# Patient Record
Sex: Male | Born: 1935 | Race: Black or African American | Hispanic: No | Smoking: Former smoker
Health system: Southern US, Community
[De-identification: ages and names within clinical notes are randomized; demographics above are authoritative.]

## PROBLEM LIST (undated history)

## (undated) DIAGNOSIS — E785 Hyperlipidemia, unspecified: Secondary | ICD-10-CM

## (undated) DIAGNOSIS — J449 Chronic obstructive pulmonary disease, unspecified: Secondary | ICD-10-CM

## (undated) DIAGNOSIS — I10 Essential (primary) hypertension: Secondary | ICD-10-CM

## (undated) DIAGNOSIS — R931 Abnormal findings on diagnostic imaging of heart and coronary circulation: Secondary | ICD-10-CM

## (undated) DIAGNOSIS — R011 Cardiac murmur, unspecified: Secondary | ICD-10-CM

## (undated) DIAGNOSIS — M6740 Ganglion, unspecified site: Secondary | ICD-10-CM

## (undated) DIAGNOSIS — K635 Polyp of colon: Secondary | ICD-10-CM

## (undated) HISTORY — DX: Polyp of colon: K63.5

## (undated) HISTORY — DX: Essential (primary) hypertension: I10

## (undated) HISTORY — DX: Ganglion, unspecified site: M67.40

## (undated) HISTORY — PX: TRANSTHORACIC ECHOCARDIOGRAM: SHX275

## (undated) HISTORY — DX: Chronic obstructive pulmonary disease, unspecified: J44.9

## (undated) HISTORY — DX: Abnormal findings on diagnostic imaging of heart and coronary circulation: R93.1

## (undated) HISTORY — DX: Hyperlipidemia, unspecified: E78.5

## (undated) HISTORY — PX: COLON SURGERY: SHX602

## (undated) HISTORY — DX: Cardiac murmur, unspecified: R01.1

## (undated) SURGERY — Surgical Case
Anesthesia: *Unknown

---

## 1998-08-07 ENCOUNTER — Emergency Department (HOSPITAL_COMMUNITY): Admission: EM | Admit: 1998-08-07 | Discharge: 1998-08-07 | Payer: Self-pay | Admitting: Emergency Medicine

## 1998-08-07 ENCOUNTER — Encounter: Payer: Self-pay | Admitting: Emergency Medicine

## 2000-04-10 ENCOUNTER — Emergency Department (HOSPITAL_COMMUNITY): Admission: EM | Admit: 2000-04-10 | Discharge: 2000-04-10 | Payer: Self-pay | Admitting: *Deleted

## 2000-05-25 ENCOUNTER — Ambulatory Visit (HOSPITAL_COMMUNITY): Admission: RE | Admit: 2000-05-25 | Discharge: 2000-05-25 | Payer: Self-pay | Admitting: Gastroenterology

## 2002-12-13 ENCOUNTER — Ambulatory Visit (HOSPITAL_COMMUNITY): Admission: RE | Admit: 2002-12-13 | Discharge: 2002-12-13 | Payer: Self-pay | Admitting: General Surgery

## 2003-03-25 ENCOUNTER — Emergency Department (HOSPITAL_COMMUNITY): Admission: EM | Admit: 2003-03-25 | Discharge: 2003-03-25 | Payer: Self-pay | Admitting: Family Medicine

## 2004-04-20 ENCOUNTER — Emergency Department (HOSPITAL_COMMUNITY): Admission: EM | Admit: 2004-04-20 | Discharge: 2004-04-20 | Payer: Self-pay | Admitting: Emergency Medicine

## 2004-05-14 ENCOUNTER — Encounter: Admission: RE | Admit: 2004-05-14 | Discharge: 2004-05-14 | Payer: Self-pay | Admitting: Family Medicine

## 2005-04-23 ENCOUNTER — Encounter: Admission: RE | Admit: 2005-04-23 | Discharge: 2005-04-23 | Payer: Self-pay | Admitting: Family Medicine

## 2005-10-29 ENCOUNTER — Encounter: Admission: RE | Admit: 2005-10-29 | Discharge: 2005-10-29 | Payer: Self-pay | Admitting: Family Medicine

## 2006-05-24 ENCOUNTER — Encounter: Admission: RE | Admit: 2006-05-24 | Discharge: 2006-05-24 | Payer: Self-pay | Admitting: Family Medicine

## 2007-02-17 ENCOUNTER — Encounter: Admission: RE | Admit: 2007-02-17 | Discharge: 2007-02-17 | Payer: Self-pay | Admitting: Family Medicine

## 2010-02-15 ENCOUNTER — Encounter: Payer: Self-pay | Admitting: Family Medicine

## 2010-06-30 ENCOUNTER — Other Ambulatory Visit: Payer: Self-pay | Admitting: Gastroenterology

## 2010-07-28 ENCOUNTER — Encounter: Payer: Self-pay | Admitting: Family Medicine

## 2010-07-28 DIAGNOSIS — J449 Chronic obstructive pulmonary disease, unspecified: Secondary | ICD-10-CM | POA: Insufficient documentation

## 2010-07-28 DIAGNOSIS — K635 Polyp of colon: Secondary | ICD-10-CM | POA: Insufficient documentation

## 2010-07-28 DIAGNOSIS — E785 Hyperlipidemia, unspecified: Secondary | ICD-10-CM | POA: Insufficient documentation

## 2010-07-28 DIAGNOSIS — R319 Hematuria, unspecified: Secondary | ICD-10-CM | POA: Insufficient documentation

## 2010-07-28 DIAGNOSIS — M6740 Ganglion, unspecified site: Secondary | ICD-10-CM | POA: Insufficient documentation

## 2010-07-28 DIAGNOSIS — I10 Essential (primary) hypertension: Secondary | ICD-10-CM | POA: Insufficient documentation

## 2010-07-28 DIAGNOSIS — R011 Cardiac murmur, unspecified: Secondary | ICD-10-CM | POA: Insufficient documentation

## 2010-07-28 DIAGNOSIS — R931 Abnormal findings on diagnostic imaging of heart and coronary circulation: Secondary | ICD-10-CM | POA: Insufficient documentation

## 2012-06-28 ENCOUNTER — Telehealth: Payer: Self-pay | Admitting: Physician Assistant

## 2012-06-28 MED ORDER — BENAZEPRIL-HYDROCHLOROTHIAZIDE 20-25 MG PO TABS: 1.0000 | ORAL_TABLET | Freq: Every day | ORAL | Status: DC

## 2012-06-28 NOTE — Telephone Encounter (Signed)
Medication refilled per protocol. 

## 2012-09-29 ENCOUNTER — Other Ambulatory Visit: Payer: Self-pay | Admitting: Physician Assistant

## 2012-09-29 ENCOUNTER — Encounter: Payer: Self-pay | Admitting: Family Medicine

## 2012-09-29 NOTE — Telephone Encounter (Signed)
Medication refill for one time only.  Patient needs to be seen.  Letter sent for patient to call and schedule 

## 2012-10-18 ENCOUNTER — Ambulatory Visit (INDEPENDENT_AMBULATORY_CARE_PROVIDER_SITE_OTHER): Payer: Medicare Other | Admitting: Physician Assistant

## 2012-10-18 ENCOUNTER — Encounter: Payer: Self-pay | Admitting: Physician Assistant

## 2012-10-18 VITALS — BP 142/76 | HR 64 | Temp 98.2°F | Resp 18 | Ht 65.75 in | Wt 185.0 lb

## 2012-10-18 DIAGNOSIS — Z23 Encounter for immunization: Secondary | ICD-10-CM

## 2012-10-18 DIAGNOSIS — R011 Cardiac murmur, unspecified: Secondary | ICD-10-CM

## 2012-10-18 DIAGNOSIS — D126 Benign neoplasm of colon, unspecified: Secondary | ICD-10-CM

## 2012-10-18 DIAGNOSIS — K635 Polyp of colon: Secondary | ICD-10-CM

## 2012-10-18 DIAGNOSIS — E785 Hyperlipidemia, unspecified: Secondary | ICD-10-CM

## 2012-10-18 DIAGNOSIS — R9389 Abnormal findings on diagnostic imaging of other specified body structures: Secondary | ICD-10-CM

## 2012-10-18 DIAGNOSIS — R931 Abnormal findings on diagnostic imaging of heart and coronary circulation: Secondary | ICD-10-CM

## 2012-10-18 DIAGNOSIS — I10 Essential (primary) hypertension: Secondary | ICD-10-CM

## 2012-10-18 LAB — COMPLETE METABOLIC PANEL WITH GFR
ALT: 24 U/L (ref 0–53)
AST: 19 U/L (ref 0–37)
Albumin: 4.2 g/dL (ref 3.5–5.2)
BUN: 16 mg/dL (ref 6–23)
CO2: 30 mEq/L (ref 19–32)
Calcium: 9.5 mg/dL (ref 8.4–10.5)
Chloride: 106 mEq/L (ref 96–112)
GFR, Est African American: 84 mL/min
Potassium: 4.3 mEq/L (ref 3.5–5.3)

## 2012-10-18 LAB — LIPID PANEL
Cholesterol: 172 mg/dL (ref 0–200)
LDL Cholesterol: 122 mg/dL — ABNORMAL HIGH (ref 0–99)
VLDL: 9 mg/dL (ref 0–40)

## 2012-10-18 NOTE — Progress Notes (Signed)
Patient ID: Patrick Villa MRN: [id number], DOB: 07/18/1933, 79 y.o. Date of Encounter: @DATE @  Chief Complaint:  Chief Complaint  Patient presents with  . routine check up    is fasting    HPI: 79 y.o. year old AA male  presents for routine followup office visit. His last office visit with me was actually back in May of 2013. However he does report that he is still taking all of the same prescription medications as directed at that time.  The only other doctor that he has seen since then his urology. He sees them routinely. He says he is actually considering being in a research study with him. Says that currently there is a pill form of the same type of medicine available that shoulder and knees for anuresis. Says that the study is for ACE nasal spray that same type of medicine that is being used to treat nocturia and adults. This he has a followup appointment with them tomorrow to to decide if he is eligible to be in the study.  Has not needed to see any other types of Dr. since his last visit with me.  He checks his blood pressure at home and sometimes gets 117 and 120s. Sometimes gives him more like 138-142.  Taking his blood pressure medicines as directed with no adverse effects. No lightheadedness. No extremity edema.  He still works. 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 He has no other exercise. With this exertion he has no chest pressure or heaviness no chest tightness or squeezing. No increased shortness of breath or dyspnea on exertion.    Past Medical History  Diagnosis Date  . Hypertension   . COPD (chronic obstructive pulmonary disease)   . Hyperlipidemia   . Colon polyps   . Systolic murmur   . Echocardiogram abnormal   . Hematuria   . Ganglion cyst      Home Meds: See attached medication section for current medication list. Any medications entered into computer today will not appear on this note's list. The medications listed below were entered prior to  today. Current Outpatient Prescriptions on File Prior to Visit  Medication Sig Dispense Refill  . aspirin 325 MG tablet Take 325 mg by mouth daily.        . benazepril-hydrochlorthiazide (LOTENSIN HCT) 20-25 MG per tablet TAKE ONE TABLET BY MOUTH DAILY  30 tablet  0  . diltiazem (CARDIZEM CD) 240 MG 24 hr capsule Take 240 mg by mouth daily.        Marland Kitchen terazosin (HYTRIN) 2 MG capsule Take 2 mg by mouth at bedtime.         No current facility-administered medications on file prior to visit.    Allergies:  Allergies  Allergen Reactions  . Amlodipine Besy-Benazepril Hcl     Jittery, gets confused.  . Lipitor [Atorvastatin Calcium]     Weakness.  . Zocor [Simvastatin]   . Bactrim [Sulfamethoxazole-Tmp Ds] Rash  . Pravachol Hives and Rash    History   Social History  . Marital Status: Widowed    Spouse Name: N/A    Number of Children: N/A  . Years of Education: N/A   Occupational History  . Not on file.   Social History Main Topics  . Smoking status: Former Smoker    Quit date: 10/18/1981  . Smokeless tobacco: Never Used  . Alcohol Use: No  . Drug Use: No  . Sexual Activity: Not on file   Other  Topics Concern  . 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   No other exercise   Plays guitar.   Separated. Lives alone.    Family History  Problem Relation Age of Onset  . Diabetes Mother   . Stroke Brother      Review of Systems:  See HPI for pertinent ROS. All other ROS negative.    Physical Exam: Blood pressure 142/76, pulse 64, temperature 98.2 F (36.8 C), temperature source Oral, resp. rate 18, height 5' 5.75" (1.67 m), weight 185 lb (83.915 kg)., Body mass index is 30.09 kg/(m^2). General: Very pleasant well-nourished well-developed African American male .Appears in no acute distress. Neck: Supple. No thyromegaly. No lymphadenopathy. No carotid bruits. Lungs: Clear bilaterally to auscultation without  wheezes, rales, or rhonchi. Breathing is unlabored. Heart: RRR with S1 S2. No murmurs, rubs, or gallops. I noted a 1/6 murmur in the past but really hear no murmur today. Abdomen: Soft, non-tender, non-distended with normoactive bowel sounds. No hepatomegaly. No rebound/guarding. No obvious abdominal masses. Musculoskeletal:  Strength and tone normal for age. Extremities/Skin: Warm and dry. No clubbing or cyanosis. No edema. No rashes or suspicious lesions. Neuro: Alert and oriented X 3. Moves all extremities spontaneously. Gait is normal. CNII-XII grossly in tact. Psych:  Responds to questions appropriately with a normal affect.     ASSESSMENT AND PLAN:  79 y.o. year old male with  1. Hypertension See his home blood pressure recordings as above. He says he actually feels quite weak whenever he does get lower readings towards the 117-120 range systolic. He is getting good readings at home. Continue current medication. - COMPLETE METABOLIC PANEL WITH GFR  2. Hyperlipidemia He has a history of intolerance to Zocor, Lipitor, Pravachol. In the past he tolerated Crestor but quit this secondary to cost. He is been off of medication since April 2009. We had rechecked his lipids since been off of medication and the numbers were good so we decided he was fine to stay off of medication.  He is fasting today so we will go ahead and recheck lipid profile off of medication again to monitor. - COMPLETE METABOLIC PANEL WITH GFR - Lipid panel  3. Colon polyps Last colonoscopy was June 2012. 2 repeat 3 years which will be June 2015.  4. Systolic murmur Last echo was June 2011. See below  5. Echocardiogram abnormal Echo June 2011 showed normal LV function. Mild aortic sclerosis. Trace MR. Trace TR.  #6 BPH/hematuria he sees urology routinely for these  #7 screening labs PSA is followed by urology routinely He had a CBC and a TSH done here of May 2012 are normal  #8 immunizations A Pneumovax  November 2012 B tetanus May 2012 C Zostavax: I discussed this again with him today. He said he did check with his insurance company but it was going to cost significant cost to him he is deferring. D influenza vaccine: He is agreeable to get this we will give this to him today.  Regular office visit 6 months or sooner if he needs [place]   Signed, [facility] [place], [place], [facility] 10/18/2012 8:37 AM

## 2012-10-18 NOTE — Addendum Note (Signed)
Addended by: Donne Anon on: 10/18/2012 09:08 AM   Modules accepted: Orders

## 2012-10-19 ENCOUNTER — Ambulatory Visit (INDEPENDENT_AMBULATORY_CARE_PROVIDER_SITE_OTHER): Payer: Self-pay | Admitting: *Deleted

## 2012-10-19 DIAGNOSIS — I10 Essential (primary) hypertension: Secondary | ICD-10-CM

## 2012-10-23 ENCOUNTER — Encounter: Payer: Self-pay | Admitting: Family Medicine

## 2012-11-10 ENCOUNTER — Other Ambulatory Visit: Payer: Self-pay | Admitting: Physician Assistant

## 2012-11-10 NOTE — Telephone Encounter (Signed)
Medication refilled per protocol. 

## 2013-02-01 ENCOUNTER — Encounter (INDEPENDENT_AMBULATORY_CARE_PROVIDER_SITE_OTHER): Payer: Self-pay

## 2013-02-01 ENCOUNTER — Encounter: Payer: Self-pay | Admitting: Cardiology

## 2013-02-01 ENCOUNTER — Ambulatory Visit: Payer: Self-pay | Admitting: *Deleted

## 2013-02-01 DIAGNOSIS — R011 Cardiac murmur, unspecified: Secondary | ICD-10-CM

## 2013-02-01 NOTE — Progress Notes (Signed)
Pt seen for EKG for [facility].   Pt pleasant and voices no complaints today. EKG complete, reviewed by Dr. Aundra Dubin (DOD) Copy of EKG given to patient, medical records and business manager Pt escorted to lobby in stable condition.

## 2013-05-22 ENCOUNTER — Other Ambulatory Visit: Payer: Self-pay | Admitting: Physician Assistant

## 2013-05-23 NOTE — Telephone Encounter (Signed)
Do not see Diltiazem 90 mg TID on medication list.  Have called and left message for patient to find out what he is taking.

## 2013-05-24 NOTE — Telephone Encounter (Signed)
Called patient and medication clarified.  Not taking 240 mg 24 hr dose due to cost.  Is taking TID dose.    1 month visit appt made.  One month refill sent

## 2013-05-31 ENCOUNTER — Ambulatory Visit (INDEPENDENT_AMBULATORY_CARE_PROVIDER_SITE_OTHER): Payer: Medicare Other | Admitting: Physician Assistant

## 2013-05-31 ENCOUNTER — Encounter: Payer: Self-pay | Admitting: Physician Assistant

## 2013-05-31 VITALS — BP 134/76 | HR 68 | Temp 97.8°F | Resp 18 | Ht 65.0 in | Wt 187.0 lb

## 2013-05-31 DIAGNOSIS — Z23 Encounter for immunization: Secondary | ICD-10-CM

## 2013-05-31 DIAGNOSIS — R931 Abnormal findings on diagnostic imaging of heart and coronary circulation: Secondary | ICD-10-CM

## 2013-05-31 DIAGNOSIS — R9389 Abnormal findings on diagnostic imaging of other specified body structures: Secondary | ICD-10-CM

## 2013-05-31 DIAGNOSIS — N400 Enlarged prostate without lower urinary tract symptoms: Secondary | ICD-10-CM

## 2013-05-31 DIAGNOSIS — J449 Chronic obstructive pulmonary disease, unspecified: Secondary | ICD-10-CM

## 2013-05-31 DIAGNOSIS — R011 Cardiac murmur, unspecified: Secondary | ICD-10-CM

## 2013-05-31 DIAGNOSIS — I10 Essential (primary) hypertension: Secondary | ICD-10-CM

## 2013-05-31 DIAGNOSIS — K635 Polyp of colon: Secondary | ICD-10-CM

## 2013-05-31 DIAGNOSIS — D126 Benign neoplasm of colon, unspecified: Secondary | ICD-10-CM

## 2013-05-31 DIAGNOSIS — E785 Hyperlipidemia, unspecified: Secondary | ICD-10-CM

## 2013-05-31 LAB — BASIC METABOLIC PANEL WITH GFR
BUN: 13 mg/dL (ref 6–23)
CALCIUM: 9.5 mg/dL (ref 8.4–10.5)
CO2: 28 mEq/L (ref 19–32)
CREATININE: 1 mg/dL (ref 0.50–1.35)
Chloride: 102 mEq/L (ref 96–112)
GFR, EST AFRICAN AMERICAN: 84 mL/min
GFR, Est Non African American: 72 mL/min
GLUCOSE: 101 mg/dL — AB (ref 70–99)
Potassium: 3.9 mEq/L (ref 3.5–5.3)
SODIUM: 139 meq/L (ref 135–145)

## 2013-05-31 MED ORDER — BENAZEPRIL-HYDROCHLOROTHIAZIDE 20-25 MG PO TABS: ORAL_TABLET | ORAL | Status: DC

## 2013-05-31 MED ORDER — TERAZOSIN HCL 2 MG PO CAPS: 2.0000 mg | ORAL_CAPSULE | Freq: Every day | ORAL | Status: DC

## 2013-05-31 MED ORDER — DILTIAZEM HCL 90 MG PO TABS
ORAL_TABLET | ORAL | Status: DC
Start: 2013-05-31 — End: 2014-06-05

## 2013-05-31 NOTE — Progress Notes (Signed)
Patient ID: Patrick Villa MRN: [id number], DOB: [birth year], 80 y.o. Date of Encounter: @DATE @  Chief Complaint:  Chief Complaint  Patient presents with  . 6 mth check up    is fasting    HPI: 80 y.o. year old AA male  presents for routine followup office visit. His last office visit with me was 09/2012.  The only other doctor that he has seen since then his urology. He sees them routinely. Has not needed to see any other types of medical providers.  He checks his blood pressure at home and sometimes gets 117 and 120s. Sometimes gives him more like 138-142.  Taking his blood pressure medicines as directed with no adverse effects. No lightheadedness. No extremity edema.  He still works. 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 He has no other exercise. With this exertion he has no chest pressure or heaviness no chest tightness or squeezing. No increased shortness of breath or dyspnea on exertion. 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    Past Medical History  Diagnosis Date  . Hypertension   . COPD (chronic obstructive pulmonary disease)   . Hyperlipidemia   . Colon polyps   . Systolic murmur   . Echocardiogram abnormal   . Hematuria   . Ganglion cyst      Home Meds: See attached medication section for current medication list. Any medications entered into computer today will not appear on this note's list. The medications listed below were entered prior to today. Current Outpatient Prescriptions on File Prior to Visit  Medication Sig Dispense Refill  . aspirin 325 MG tablet Take 325 mg by mouth daily.        . benazepril-hydrochlorthiazide (LOTENSIN HCT) 20-25 MG per tablet TAKE 1 TABLET BY MOUTH DAILY  30 tablet  6  . diltiazem (CARDIZEM) 90 MG tablet TAKE ONE TABLET BY MOUTH THREE TIMES DAILY  90 tablet  0  . terazosin (HYTRIN) 2 MG capsule Take 2 mg by mouth at bedtime.         No current  facility-administered medications on file prior to visit.    Allergies:  Allergies  Allergen Reactions  . Amlodipine Besy-Benazepril Hcl     Jittery, gets confused.  . Lipitor [Atorvastatin Calcium]     Weakness.  . Zocor [Simvastatin]   . Bactrim [Sulfamethoxazole-Tmp Ds] Rash  . Pravachol Hives and Rash    History   Social History  . Marital Status: Widowed    Spouse Name: N/A    Number of Children: N/A  . Years of Education: N/A   Occupational History  . Not on file.   Social History Main Topics  . Smoking status: Former Smoker    Quit date: 10/18/1981  . Smokeless tobacco: Never Used  . Alcohol Use: No  . Drug Use: No  . Sexual Activity: Not on file   Other Topics Concern  . 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   No other exercise   Plays guitar.   Separated. Lives alone.    Family History  Problem Relation Age of Onset  . Diabetes Mother   . Stroke Brother      Review of Systems:  See HPI for pertinent ROS. All other ROS negative.    Physical Exam: Blood pressure 134/76, pulse 68, temperature 97.8 F (36.6 C), temperature source  Oral, resp. rate 18, height 5\' 5"  (1.651 m), weight 187 lb (84.823 kg)., Body mass index is 31.12 kg/(m^2). General: Very pleasant well-nourished well-developed African American male .Appears in no acute distress. Neck: Supple. No thyromegaly. No lymphadenopathy. No carotid bruits. Lungs: Clear bilaterally to auscultation without wheezes, rales, or rhonchi. Breathing is unlabored. Heart: RRR with S1 S2. No murmurs, rubs, or gallops. I noted a 1/6 murmur in the past but really hear no murmur today. Abdomen: Soft, non-tender, non-distended with normoactive bowel sounds. No hepatomegaly. No rebound/guarding. No obvious abdominal masses. Musculoskeletal:  Strength and tone normal for age. Extremities/Skin: Warm and dry. No clubbing or cyanosis. No edema. No rashes or  suspicious lesions. Neuro: Alert and oriented X 3. Moves all extremities spontaneously. Gait is normal. CNII-XII grossly in tact. Psych:  Responds to questions appropriately with a normal affect.     ASSESSMENT AND PLAN:  80 y.o. year old male with  1. Hypertension BP good today at 134/76.  He is getting good readings at home. Continue current medication. - BMET  2. Hyperlipidemia He has a history of intolerance to Zocor, Lipitor, Pravachol. In the past he tolerated Crestor but quit this secondary to cost. He is been off of medication since April 2009.  We had rechecked his lipids since been off of medication  --checked 09/2012--off medication--LDL was 122.   numbers were good so we decided he was fine to stay off of medication.  Can wait to recheck in future.  3. Colon polyps Last colonoscopy was June 2012. To repeat 3 years which will be June 2015. I wrote on his AVS today to call Dr.Buccini/[facility] to schedule.   4. Systolic murmur Last echo was June 2011. See below  5. Echocardiogram abnormal Echo June 2011 showed normal LV function. Mild aortic sclerosis. Trace MR. Trace TR.  #6 BPH/hematuria he sees urology routinely for these  #7 screening labs PSA is followed by urology routinely He had a CBC and a TSH done here of May 2012 are normal  #8 immunizations A Pneumovax November 2012. Discussed Prevnar 13. He is agreeable to receive this today.  B tetanus May 2012 C Zostavax: I discussed this again with him today. He said he did check with his insurance company but it was going to cost significant cost to him he is deferring. D influenza vaccine: He received 9/014.   I PLACED THE FOLLOWING ORDERS TODAY:  1. Hypertension - BASIC METABOLIC PANEL WITH GFR - terazosin (HYTRIN) 2 MG capsule; Take 1 capsule (2 mg total) by mouth at bedtime.  Dispense: 30 capsule; Refill: 5 - diltiazem (CARDIZEM) 90 MG tablet; TAKE ONE TABLET BY MOUTH THREE TIMES DAILY  Dispense: 90 tablet;  Refill: 5 - benazepril-hydrochlorthiazide (LOTENSIN HCT) 20-25 MG per tablet; TAKE 1 TABLET BY MOUTH DAILY  Dispense: 30 tablet; Refill: 6  2. BPH (benign prostatic hyperplasia) - terazosin (HYTRIN) 2 MG capsule; Take 1 capsule (2 mg total) by mouth at bedtime.  Dispense: 30 capsule; Refill: 5  3. Need for prophylactic vaccination against Streptococcus pneumoniae (pneumococcus) - Pneumococcal conjugate vaccine 13-valent   Regular office visit 6 months or sooner if he needs [place]   Signed, Karis Juba, [place], B[facility] 05/31/2013 8:30 AM

## 2013-06-22 ENCOUNTER — Other Ambulatory Visit: Payer: Self-pay | Admitting: Physician Assistant

## 2013-06-22 NOTE — Telephone Encounter (Signed)
Medication refilled per protocol. 

## 2013-07-09 ENCOUNTER — Other Ambulatory Visit: Payer: Self-pay | Admitting: Gastroenterology

## 2013-08-01 ENCOUNTER — Ambulatory Visit: Payer: Medicare Other | Admitting: Family Medicine

## 2013-08-01 VITALS — BP 138/78

## 2013-08-01 DIAGNOSIS — I10 Essential (primary) hypertension: Secondary | ICD-10-CM

## 2013-08-15 ENCOUNTER — Telehealth: Payer: Self-pay | Admitting: Physician Assistant

## 2013-08-15 NOTE — Telephone Encounter (Signed)
Called and LM for pt to call back needs to schedule [facility] and CPE

## 2013-12-03 ENCOUNTER — Ambulatory Visit: Payer: Medicare Other | Admitting: Physician Assistant

## 2013-12-27 ENCOUNTER — Other Ambulatory Visit: Payer: Self-pay | Admitting: Physician Assistant

## 2013-12-27 NOTE — Telephone Encounter (Signed)
Medication refilled per protocol. 

## 2014-01-02 ENCOUNTER — Encounter: Payer: Self-pay | Admitting: *Deleted

## 2014-01-02 ENCOUNTER — Encounter: Payer: Self-pay | Admitting: Physician Assistant

## 2014-01-02 ENCOUNTER — Ambulatory Visit (INDEPENDENT_AMBULATORY_CARE_PROVIDER_SITE_OTHER): Payer: Medicare Other | Admitting: Physician Assistant

## 2014-01-02 VITALS — BP 152/80 | HR 76 | Temp 98.2°F | Resp 18 | Ht 66.0 in | Wt 194.0 lb

## 2014-01-02 DIAGNOSIS — Z0000 Encounter for general adult medical examination without abnormal findings: Secondary | ICD-10-CM

## 2014-01-02 DIAGNOSIS — Z23 Encounter for immunization: Secondary | ICD-10-CM

## 2014-01-02 LAB — CBC WITH DIFFERENTIAL/PLATELET
BASOS ABS: 0.1 10*3/uL (ref 0.0–0.1)
Basophils Relative: 1 % (ref 0–1)
EOS ABS: 0.5 10*3/uL (ref 0.0–0.7)
EOS PCT: 8 % — AB (ref 0–5)
HCT: 39.6 % (ref 39.0–52.0)
Hemoglobin: 13.3 g/dL (ref 13.0–17.0)
Lymphocytes Relative: 34 % (ref 12–46)
Lymphs Abs: 2.2 10*3/uL (ref 0.7–4.0)
MCH: 27.9 pg (ref 26.0–34.0)
MCHC: 33.6 g/dL (ref 30.0–36.0)
MCV: 83.2 fL (ref 78.0–100.0)
MONO ABS: 0.7 10*3/uL (ref 0.1–1.0)
MPV: 9.9 fL (ref 9.4–12.4)
Monocytes Relative: 10 % (ref 3–12)
Neutro Abs: 3.1 10*3/uL (ref 1.7–7.7)
Neutrophils Relative %: 47 % (ref 43–77)
PLATELETS: 238 10*3/uL (ref 150–400)
RBC: 4.76 MIL/uL (ref 4.22–5.81)
RDW: 14 % (ref 11.5–15.5)
WBC: 6.6 10*3/uL (ref 4.0–10.5)

## 2014-01-02 LAB — LIPID PANEL
CHOL/HDL RATIO: 3.9 ratio
Cholesterol: 166 mg/dL (ref 0–200)
HDL: 43 mg/dL (ref >39)
LDL Cholesterol: 113 mg/dL — ABNORMAL HIGH (ref 0–99)
Triglycerides: 51 mg/dL (ref <150)
VLDL: 10 mg/dL (ref 0–40)

## 2014-01-02 LAB — COMPLETE METABOLIC PANEL WITH GFR
ALT: 25 U/L (ref 0–53)
AST: 24 U/L (ref 0–37)
Albumin: 4 g/dL (ref 3.5–5.2)
Alkaline Phosphatase: 44 U/L (ref 39–117)
BILIRUBIN TOTAL: 0.4 mg/dL (ref 0.2–1.2)
BUN: 12 mg/dL (ref 6–23)
CALCIUM: 9.3 mg/dL (ref 8.4–10.5)
CO2: 28 mEq/L (ref 19–32)
CREATININE: 0.96 mg/dL (ref 0.50–1.35)
Chloride: 105 mEq/L (ref 96–112)
GFR, EST AFRICAN AMERICAN: 87 mL/min
GFR, EST NON AFRICAN AMERICAN: 75 mL/min
Glucose, Bld: 95 mg/dL (ref 70–99)
Potassium: 4 mEq/L (ref 3.5–5.3)
Sodium: 142 mEq/L (ref 135–145)
Total Protein: 6.8 g/dL (ref 6.0–8.3)

## 2014-01-02 LAB — TSH: TSH: 1.08 u[IU]/mL (ref 0.350–4.500)

## 2014-01-02 NOTE — Progress Notes (Signed)
Subjective:   Patient presents for Medicare Annual/Subsequent preventive examination.   Review Past Medical/Family/Social:All are reviewed and updated in epic today.   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 Dietary issues discussed:   Eats pretty healthy.   Cardiac risk factors:   Depression Screen  (Note: if answer to either of the following is "Yes", a more complete depression screening is indicated)  Over the past two weeks, have you felt down, depressed or hopeless? No Over the past two weeks, have you felt little interest or pleasure in doing things? No Have you lost interest or pleasure in daily life? No Do you often feel hopeless? No Do you cry easily over simple problems? No   Activities of Daily Living  In your present state of health, do you have any difficulty performing the following activities?:  Driving? No  Managing money? No  Feeding yourself? No  Getting from bed to chair? No  Climbing a flight of stairs? No  Preparing food and eating?: No  Bathing or showering? No  Getting dressed: No  Getting to the toilet? No  Using the toilet:No  Moving around from place to place: No  In the past year have you fallen or had a near fall?:No  Are you sexually active? yes  Do you have more than one partner? No   Hearing Difficulties: No  Do you often ask people to speak up or repeat themselves? No  Do you experience ringing or noises in your ears? No Do you have difficulty understanding soft or whispered voices? No  Do you feel that you have a problem with memory? No Do you often misplace items? No  Do you feel safe at home? Yes  Cognitive Testing  Alert? Yes Normal Appearance?Yes  Oriented to person? Yes Place? Yes  Time? Yes  Recall of three objects? Yes  Can perform simple calculations? Yes  Displays appropriate judgment?Yes  Can read the  correct time from a watch face?Yes   List the Names of Other Physician/Practitioners you currently use:  Sees urology once a year.  [facility] ever 1 -2 years for screening. No glaucoma.  No other providers now.   Indicate any recent Medical Services you may have received from other than [facility] providers in the past year (date may be approximate).   Screening Tests / Date---See Below--at end of note Colonoscopy                     Zostavax  Mammogram  Influenza Vaccine  Tetanus/tdap    Assessment:    Annual wellness medicare exam   Plan:    During the course of the visit the patient was educated and counseled about appropriate screening and preventive services including:  Screening mammography  Colorectal cancer screening  Shingles vaccine. Prescription given to that she can get the vaccine at the pharmacy or Medicare part D.  Screen + for depression. PHQ- 9 score of 12 (moderate depression). We discussed the options of counseling versus possibly a medication. I encouraged her strongly think about the counseling. She is going through some medical problems currently and her husband is as well Mrs. been very stressful for her. She says she will think about it. She does have Xanax to use as needed. Though she may benefit from an SSRI for her more depressive type symptoms but she wants to hold off at this time.  I aksed her to please have her cardioloist send records since we have none on file.  Diet review for nutrition referral? Yes ____ Not Indicated __x__  Patient Instructions (the written plan) was given to the patient.  Medicare Attestation  I have personally reviewed:  The patient's medical and social history  Their use of alcohol, tobacco or illicit drugs  Their current medications and supplements  The patient's functional ability including ADLs,fall risks, home safety risks, cognitive, and hearing and visual impairment  Diet and physical activities  Evidence for  depression or mood disorders  The patient's weight, height, BMI, and visual acuity have been recorded in the chart. I have made referrals, counseling, and provided education to the patient based on review of the above and I have provided the patient with a written personalized care plan for preventive services.     Physical Exam: Blood pressure 134/76, pulse 68, temperature 97.8 F (36.6 C), temperature source Oral, resp. rate 18, height 5\' 5"  (1.651 m), weight 187 lb (84.823 kg)., Body mass index is 31.12 kg/(m^2). General: Very pleasant well-nourished well-developed African American male .Appears in no acute distress. Neck: Supple. No thyromegaly. No lymphadenopathy. No carotid bruits. Lungs: Clear bilaterally to auscultation without wheezes, rales, or rhonchi. Breathing is unlabored. Heart: RRR with S1 S2. No murmurs, rubs, or gallops. I noted a 1/6 murmur in the past but really hear no murmur today. Abdomen: Soft, non-tender, non-distended with normoactive bowel sounds. No hepatomegaly. No rebound/guarding. No obvious abdominal masses. Musculoskeletal: Strength and tone normal for age. Extremities/Skin: Warm and dry. No clubbing or cyanosis. No edema. No rashes or suspicious lesions. Neuro: Alert and oriented X 3. Moves all extremities spontaneously. Gait is normal. CNII-XII grossly in tact. Psych: Responds to questions appropriately with a normal affect.     80 y.o. year old male with  1. Hypertension BP good today . He is getting good readings at home. Continue current medication. - BMET  2. Hyperlipidemia He has a history of intolerance to Zocor, Lipitor, Pravachol. In the past he tolerated Crestor but quit this secondary to cost. He is been off of medication since April 2009. We had rechecked his lipids since been off of medication --checked 09/2012--off medication--LDL was 122. numbers were good so we decided he was fine to stay off of medication.  Can wait to recheck in  future.  3. Colon polyps Last colonoscopy was June 2012. To repeat 3 years which will be June 2015. I wrote on his AVS at [facility] 05/31/13 to call Dr.Buccini/[facility] to schedule.  Pt says he had colonoscopy performed--2015--not sure if supposed to repeat 5 years or 10 years. I do not see report scanned in Epic  4. Systolic murmur Last echo was June 2011. See below  5. Echocardiogram abnormal Echo June 2011 showed normal LV function. Mild aortic sclerosis. Trace MR. Trace TR.  #6 BPH/hematuria he sees urology routinely for these  #7 screening labs PSA is followed by urology routinely He had a CBC and a TSH done here of May 2012 are normal. Check all screening labsnow. Is fasting--12/2013  #8 immunizations A Pneumovax--- November 2012.  Prevnar 13--given here 05/31/2013 B Tetanus--- May 2012 C Zostavax: I discussed this again with him today. He said he did check with his insurance company but it was going to cost significant cost to him he is deferring. D influenza vaccine: He received 9/014.Has not received yet this year--agreeable to receive today--12/2013    [facility] for  Routine Eye Checks--Has had Glaucoma screen--no glaucoma per pt.  Has never had DEXA/BMD-- Pt agreeable fot me to order.

## 2014-01-03 LAB — VITAMIN D 25 HYDROXY (VIT D DEFICIENCY, FRACTURES): VIT D 25 HYDROXY: 20 ng/mL — AB (ref 30–100)

## 2014-01-04 ENCOUNTER — Encounter: Payer: Self-pay | Admitting: *Deleted

## 2014-01-29 ENCOUNTER — Other Ambulatory Visit: Payer: Self-pay | Admitting: Physician Assistant

## 2014-01-29 NOTE — Telephone Encounter (Signed)
Medication refilled per protocol. 

## 2014-02-22 ENCOUNTER — Encounter: Payer: Self-pay | Admitting: Family Medicine

## 2014-02-22 ENCOUNTER — Ambulatory Visit (INDEPENDENT_AMBULATORY_CARE_PROVIDER_SITE_OTHER): Payer: 59 | Admitting: Family Medicine

## 2014-02-22 VITALS — BP 178/82 | HR 76 | Temp 98.3°F | Resp 18 | Wt 203.0 lb

## 2014-02-22 DIAGNOSIS — R1032 Left lower quadrant pain: Secondary | ICD-10-CM | POA: Diagnosis not present

## 2014-02-22 DIAGNOSIS — R361 Hematospermia: Secondary | ICD-10-CM

## 2014-02-22 LAB — URINALYSIS, ROUTINE W REFLEX MICROSCOPIC
BILIRUBIN URINE: NEGATIVE
GLUCOSE, UA: NEGATIVE mg/dL
KETONES UR: NEGATIVE mg/dL
Nitrite: NEGATIVE
PH: 6.5 (ref 5.0–8.0)
PROTEIN: NEGATIVE mg/dL
SPECIFIC GRAVITY, URINE: 1.015 (ref 1.005–1.030)
Urobilinogen, UA: 0.2 mg/dL (ref 0.0–1.0)

## 2014-02-22 LAB — URINALYSIS, MICROSCOPIC ONLY
CRYSTALS: NONE SEEN
Casts: NONE SEEN
SQUAMOUS EPITHELIAL / LPF: NONE SEEN

## 2014-02-22 MED ORDER — METRONIDAZOLE 500 MG PO TABS: 500.0000 mg | ORAL_TABLET | Freq: Three times a day (TID) | ORAL | Status: DC

## 2014-02-22 MED ORDER — BENAZEPRIL-HYDROCHLOROTHIAZIDE 20-12.5 MG PO TABS: 2.0000 | ORAL_TABLET | Freq: Every day | ORAL | Status: DC

## 2014-02-22 MED ORDER — CIPROFLOXACIN HCL 500 MG PO TABS: 500.0000 mg | ORAL_TABLET | Freq: Two times a day (BID) | ORAL | Status: DC

## 2014-02-22 MED ORDER — MOMETASONE FUROATE 0.1 % EX CREA: 1.0000 "application " | TOPICAL_CREAM | Freq: Every day | CUTANEOUS | Status: DC

## 2014-02-22 NOTE — Addendum Note (Signed)
Addended by: Olevia Perches on: 02/22/2014 02:35 PM   Modules accepted: Orders

## 2014-02-22 NOTE — Progress Notes (Signed)
Subjective:    Patient ID: Patrick Villa, male    DOB: Nov 09, 1933, 81 y.o.   MRN: [id number]  HPI Patient is a pleasant 81 year old African-American male who presents with 2 days of left lower quadrant abdominal. The pain is colicky in nature. It is not exacerbated or alleviated by any known factors. It is unrelated to movement or food or position. The pain is somewhat better today although still present this morning. The pain would be intense at times and then resolve. He denies any constipation. He denies any melena or hematochezia. He denies any diarrhea. He denies any nausea or vomiting. He denies any hematuria or dysuria. He did have one episode of traumatic aspermia that was painless. There is a trace amount of blood after he ejaculated yesterday. Otherwise he has been doing well with no symptoms. His blood pressure is very elevated today. Past Medical History  Diagnosis Date  . Hypertension   . COPD (chronic obstructive pulmonary disease)   . Hyperlipidemia   . Colon polyps   . Systolic murmur   . Echocardiogram abnormal   . Hematuria   . Ganglion cyst    Past Surgical History  Procedure Laterality Date  . Colon surgery    . Transthoracic echocardiogram     Current Outpatient Prescriptions on File Prior to Visit  Medication Sig Dispense Refill  . aspirin 325 MG tablet Take 325 mg by mouth daily.      . benazepril-hydrochlorthiazide (LOTENSIN HCT) 20-25 MG per tablet TAKE 1 TABLET EVERY DAY 30 tablet 5  . diltiazem (CARDIZEM) 90 MG tablet TAKE ONE TABLET BY MOUTH THREE TIMES DAILY 90 tablet 5  . terazosin (HYTRIN) 5 MG capsule Take 1 capsule by mouth daily.     No current facility-administered medications on file prior to visit.   Allergies  Allergen Reactions  . Amlodipine Besy-Benazepril Hcl     Jittery, gets confused.  . Lipitor [Atorvastatin Calcium]     Weakness.  . Zocor [Simvastatin]   . Bactrim [Sulfamethoxazole-Trimethoprim] Rash  . Pravachol Hives and Rash    History   Social History  . Marital Status: Widowed    Spouse Name: N/A    Number of Children: N/A  . Years of Education: N/A   Occupational History  . Not on file.   Social History Main Topics  . Smoking status: Former Smoker    Quit date: 10/18/1981  . Smokeless tobacco: Never Used  . Alcohol Use: No  . Drug Use: No  . Sexual Activity: Not on file   Other Topics Concern  . 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   No other exercise   Plays guitar.   Separated. Lives alone.      Review of Systems  All other systems reviewed and are negative.      Objective:   Physical Exam  Cardiovascular: Normal rate, regular rhythm and normal heart sounds.   Pulmonary/Chest: Effort normal and breath sounds normal. No respiratory distress. He has no wheezes. He has no rales.  Abdominal: Soft. Bowel sounds are normal. He exhibits no distension. There is no tenderness. There is no rebound and no guarding.  Vitals reviewed. no CVAT        Assessment & Plan:  Hematospermia - Plan: Urinalysis, Routine w reflex microscopic  LLQ abdominal pain - Plan: CBC with Differential/Platelet, COMPLETE METABOLIC PANEL WITH GFR, Urinalysis, Routine w reflex microscopic  Differential diagnosis  includes diverticulitis versus nephrolithiasis versus urinary tract infection.  I believe the hematospermia was unrelated and likely benign. The patient has an appointment with his urologist for later today.  Urinalysis shows a trace amount of blood and a trace amount of leukocyte esterase but not enough to explain a kidney infection or nephrolithiasis. The pain has now subsided so if it was a kidney stoneit has already passed. I'm leaning to a mild case of diverticulitis that has improved on its own. I did send a prescription for antibiotics to the patient's pharmacy.  If he develops recurrent left lower quadrant pain that intensifies or fever I  want him to start the antibiotics immediately.

## 2014-02-24 LAB — URINE CULTURE
Colony Count: NO GROWTH
ORGANISM ID, BACTERIA: NO GROWTH

## 2014-04-03 ENCOUNTER — Encounter: Payer: Self-pay | Admitting: Physician Assistant

## 2014-04-08 DIAGNOSIS — M79671 Pain in right foot: Secondary | ICD-10-CM | POA: Diagnosis not present

## 2014-04-08 DIAGNOSIS — M25571 Pain in right ankle and joints of right foot: Secondary | ICD-10-CM | POA: Diagnosis not present

## 2014-04-08 DIAGNOSIS — M7671 Peroneal tendinitis, right leg: Secondary | ICD-10-CM | POA: Diagnosis not present

## 2014-04-08 DIAGNOSIS — M65871 Other synovitis and tenosynovitis, right ankle and foot: Secondary | ICD-10-CM | POA: Diagnosis not present

## 2014-05-20 ENCOUNTER — Ambulatory Visit (INDEPENDENT_AMBULATORY_CARE_PROVIDER_SITE_OTHER): Payer: Medicare Other | Admitting: Physician Assistant

## 2014-05-20 ENCOUNTER — Encounter: Payer: Self-pay | Admitting: Physician Assistant

## 2014-05-20 VITALS — BP 152/74 | HR 76 | Temp 98.2°F | Resp 18 | Wt 196.0 lb

## 2014-05-20 DIAGNOSIS — L209 Atopic dermatitis, unspecified: Secondary | ICD-10-CM

## 2014-05-20 MED ORDER — CLOTRIMAZOLE-BETAMETHASONE 1-0.05 % EX CREA: 1.0000 "application " | TOPICAL_CREAM | Freq: Two times a day (BID) | CUTANEOUS | Status: DC

## 2014-05-20 NOTE — Progress Notes (Signed)
    Patient ID: Patrick Villa MRN: [id number], DOB: 03-Nov-1933, 81 y.o. Date of Encounter: 05/20/2014, 3:57 PM    Chief Complaint:  Chief Complaint  Patient presents with  . irritation in groin area     HPI: 81 y.o. year old AA male has area of rash on his left lower abdomen. Has applied some over-the-counter hydrocortisone cream that has helped a little bit but it wasn't resolving so he thought he should get it evaluated. Says that it's a little bit itchy but not severe itching. No other complaints or concerns today.     Home Meds:   Outpatient Prescriptions Prior to Visit  Medication Sig Dispense Refill  . aspirin 325 MG tablet Take 325 mg by mouth daily.      . benazepril-hydrochlorthiazide (LOTENSIN HCT) 20-12.5 MG per tablet Take 2 tablets by mouth daily. 60 tablet 5  . diltiazem (CARDIZEM) 90 MG tablet TAKE ONE TABLET BY MOUTH THREE TIMES DAILY 90 tablet 5  . terazosin (HYTRIN) 5 MG capsule Take 1 capsule by mouth daily.    . benazepril-hydrochlorthiazide (LOTENSIN HCT) 20-25 MG per tablet TAKE 1 TABLET EVERY DAY 30 tablet 5  . ciprofloxacin (CIPRO) 500 MG tablet Take 1 tablet (500 mg total) by mouth 2 (two) times daily. 20 tablet 0  . metroNIDAZOLE (FLAGYL) 500 MG tablet Take 1 tablet (500 mg total) by mouth 3 (three) times daily. 30 tablet 0  . mometasone (ELOCON) 0.1 % cream Apply 1 application topically daily. 45 g 0   No facility-administered medications prior to visit.    Allergies:  Allergies  Allergen Reactions  . Amlodipine Besy-Benazepril Hcl     Jittery, gets confused.  . Lipitor [Atorvastatin Calcium]     Weakness.  . Zocor [Simvastatin]   . Bactrim [Sulfamethoxazole-Trimethoprim] Rash  . Pravachol Hives and Rash      Review of Systems: See HPI for pertinent ROS. All other ROS negative.    Physical Exam: Blood pressure 152/74, pulse 76, temperature 98.2 F (36.8 C), temperature source Oral, resp. rate 18, weight 196 lb (88.905 kg)., Body mass index  is 31.65 kg/(m^2). General:  WNWD AAM. Appears in no acute distress. Neck: Supple. No thyromegaly. No lymphadenopathy. Lungs: Clear bilaterally to auscultation without wheezes, rales, or rhonchi. Breathing is unlabored. Heart: Regular rhythm. No murmurs, rubs, or gallops. Msk:  Strength and tone normal for age. Skin: Left Lower Abdomen---His lower abdomen does protrude out quite a bit---this area of rash is at area of abdomen that protrudes out the furthest--it is approximately 4 inches from umbilicus, but it is not on underside of abdomen. It is approximately 1.5 cm diameter area of darker brown coloration than his regular skin color. There is some scale.  Neuro: Alert and oriented X 3. Moves all extremities spontaneously. Gait is normal. CNII-XII grossly in tact. Psych:  Responds to questions appropriately with a normal affect.     ASSESSMENT AND PLAN:  81 y.o. year old male with  1. Dermatitis, atopic Eczema vs fungal infection--treat with lotrisone. Apply this at least twice a day for 2 weeks. If rash worsens or does not improve at that point then call us for follow-up. - clotrimazole-betamethasone (LOTRISONE) cream; Apply 1 application topically 2 (two) times daily.  Dispense: 30 g; Refill: 0   Signed, [address] [place], [place], [facility] 05/20/2014 3:57 PM

## 2014-06-05 ENCOUNTER — Other Ambulatory Visit: Payer: Self-pay | Admitting: Physician Assistant

## 2014-06-05 NOTE — Telephone Encounter (Signed)
Medication refilled per protocol. 

## 2014-07-03 ENCOUNTER — Other Ambulatory Visit: Payer: Self-pay | Admitting: Physician Assistant

## 2014-07-08 ENCOUNTER — Ambulatory Visit: Payer: Medicare Other | Admitting: Physician Assistant

## 2014-07-17 ENCOUNTER — Ambulatory Visit (INDEPENDENT_AMBULATORY_CARE_PROVIDER_SITE_OTHER): Payer: Medicare Other | Admitting: Physician Assistant

## 2014-07-17 ENCOUNTER — Encounter: Payer: Self-pay | Admitting: Physician Assistant

## 2014-07-17 VITALS — BP 126/60 | HR 72 | Temp 98.1°F | Resp 18 | Wt 199.0 lb

## 2014-07-17 DIAGNOSIS — R011 Cardiac murmur, unspecified: Secondary | ICD-10-CM | POA: Diagnosis not present

## 2014-07-17 DIAGNOSIS — M6740 Ganglion, unspecified site: Secondary | ICD-10-CM

## 2014-07-17 DIAGNOSIS — J439 Emphysema, unspecified: Secondary | ICD-10-CM

## 2014-07-17 DIAGNOSIS — E785 Hyperlipidemia, unspecified: Secondary | ICD-10-CM

## 2014-07-17 DIAGNOSIS — I10 Essential (primary) hypertension: Secondary | ICD-10-CM

## 2014-07-17 DIAGNOSIS — E559 Vitamin D deficiency, unspecified: Secondary | ICD-10-CM | POA: Diagnosis not present

## 2014-07-17 DIAGNOSIS — K635 Polyp of colon: Secondary | ICD-10-CM

## 2014-07-17 DIAGNOSIS — R931 Abnormal findings on diagnostic imaging of heart and coronary circulation: Secondary | ICD-10-CM | POA: Diagnosis not present

## 2014-07-17 LAB — BASIC METABOLIC PANEL WITH GFR
BUN: 13 mg/dL (ref 6–23)
CALCIUM: 9.3 mg/dL (ref 8.4–10.5)
CO2: 27 mEq/L (ref 19–32)
CREATININE: 0.95 mg/dL (ref 0.50–1.35)
Chloride: 104 mEq/L (ref 96–112)
GFR, Est African American: 88 mL/min
GFR, Est Non African American: 76 mL/min
Glucose, Bld: 109 mg/dL — ABNORMAL HIGH (ref 70–99)
POTASSIUM: 3.8 meq/L (ref 3.5–5.3)
SODIUM: 141 meq/L (ref 135–145)

## 2014-07-17 MED ORDER — VITAMIN D 50 MCG (2000 UT) PO CAPS: 1.0000 | ORAL_CAPSULE | Freq: Every day | ORAL | Status: DC

## 2014-07-17 NOTE — Progress Notes (Signed)
Patient ID: MCARTHUR IVINS MRN: [id number], DOB: Jun 21, 1933, 81 y.o. Date of Encounter: @DATE @  Chief Complaint:  Chief Complaint  Patient presents with  . 6 mth check up    is fasting    HPI: 81 y.o. year old AA male presents for routine followup office visit.   At visit 07/17/14 he says that he has quite a garden. Says that he has been having problems with groundhogs getting into it. Has corn, squash, corn, cucumbers, tomatoes, green beans etc.  The only other doctor that he has seen since then his urology. He sees them routinely. Has not needed to see any other types of medical providers.  List the Names of Other Physician/Practitioners you currently use:  Sees urology once a year.  [place] ever 1 -2 years for screening. No glaucoma.  No other providers now.    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 Dietary issues discussed:   Eats pretty healthy.     He checks his blood pressure at home and sometimes gets 117 and 120s. Sometimes gives him more like 138-142. Taking his blood pressure medicines as directed with no adverse effects. No lightheadedness. No lower extremity edema.  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 He has no other exercise. With this exertion he has no chest pressure or heaviness no chest tightness or squeezing. No increased shortness of breath or dyspnea on exertion. 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    Past Medical History  Diagnosis Date  . Hypertension   . COPD (chronic obstructive pulmonary disease)   . Hyperlipidemia   . Colon polyps   . Systolic murmur   . Echocardiogram abnormal   . Hematuria   . Ganglion cyst            Outpatient Prescriptions Prior to Visit  Medication Sig Dispense  Refill  . aspirin 325 MG tablet Take 325 mg by mouth daily.      . benazepril-hydrochlorthiazide (LOTENSIN HCT) 20-12.5 MG per tablet Take 2 tablets by mouth daily. 60 tablet 5  . clotrimazole-betamethasone (LOTRISONE) cream Apply 1 application topically 2 (two) times daily. 30 g 0  . diltiazem (CARDIZEM) 90 MG tablet TAKE 1 TABLET BY MOUTH THREE TIMES DAILY 90 tablet 0  . terazosin (HYTRIN) 5 MG capsule Take 1 capsule by mouth daily.     No facility-administered medications prior to visit.    Allergies:  Allergies  Allergen Reactions  . Amlodipine Besy-Benazepril Hcl     Jittery, gets confused.  . Lipitor [Atorvastatin Calcium]     Weakness.  . Zocor [Simvastatin]   . Bactrim [Sulfamethoxazole-Tmp Ds] Rash  . Pravachol Hives and Rash    History   Social History  . Marital Status: Widowed    Spouse Name: N/A    Number of Children: N/A  . Years of Education: N/A             Physical Exam: Blood pressure 134/76, pulse 68, temperature 97.8 F (36.6 C), temperature source Oral, resp. rate 18, height 5\' 5"  (1.651 m), weight 187 lb (84.823 kg)., Body mass index is 31.12 kg/(m^2). General: Very pleasant well-nourished well-developed African American male .Appears in no acute distress. Neck: Supple. No thyromegaly. No lymphadenopathy. No carotid bruits. Lungs: Clear bilaterally to auscultation without wheezes, rales, or rhonchi.  Breathing is unlabored. Heart: RRR with S1 S2. No murmurs, rubs, or gallops. I noted a 1/6 murmur in the past but really hear no murmur today. Abdomen: Soft, non-tender, non-distended with normoactive bowel sounds. No hepatomegaly. No rebound/guarding. No obvious abdominal masses. Musculoskeletal: Strength and tone normal for age. Extremities/Skin: Warm and dry. No clubbing or cyanosis. No edema. No rashes or suspicious lesions. Neuro: Alert and oriented X 3. Moves all extremities spontaneously. Gait is  normal. CNII-XII grossly in tact. Psych: Responds to questions appropriately with a normal affect.     81 y.o. year old male with  1. Hypertension BP good today . He is getting good readings at home. Continue current medication. - BMET  2. Hyperlipidemia He has a history of intolerance to Zocor, Lipitor, Pravachol. In the past he tolerated Crestor but quit this secondary to cost. He is been off of medication since April 2009. We had rechecked his lipids since been off of medication --checked 09/2012--off medication--LDL was 122. numbers were good so we decided he was fine to stay off of medication.  Can wait to recheck in future.  3. Vitamin D Deficiency:  At labs 12/2013 recommended to take vitamin D 2000 IUs daily. Patient states that he is taking this at this dose.  3. Colon polyps Last colonoscopy was June 2012. To repeat 3 years which will be June 2015. I wrote on his AVS at [place] 05/31/13 to call Dr.Buccini/[facility] to schedule.  Pt says he had colonoscopy performed--2015--not sure if supposed to repeat 5 years or 10 years. I do not see report scanned in Epic  4. Systolic murmur Last echo was June 2011. See below  5. Echocardiogram abnormal Echo June 2011 showed normal LV function. Mild aortic sclerosis. Trace MR. Trace TR.  6 BPH/hematuria  --He sees urology routinely for these  7. Preventive Care A.  Screening Labs: PSA is followed by urology routinely Fasting, Screening Labs--12/2013  B. Prostate Cancer Screening: Per Urology  C. Colorectal cancer screening: Last colonoscopy was June 2012. To repeat 3 years which will be June 2015. I wrote on his AVS at [place] 05/31/13 to call Dr.Buccini/[facility] to schedule.  Pt says he had colonoscopy performed--2015--not sure if supposed to repeat 5 years or 10 years. I do not see report scanned in Epic  D. Immunizations A Pneumovax--- November 2012.  Prevnar 13--given here 05/31/2013 B Tetanus--- May 2012 C Zostavax: I discussed  this again with him today. He said he did check with his insurance company but it was going to cost significant cost to him he is deferring. D influenza vaccine: He received --12/2013    [facility] for Routine Eye Checks--Has had Glaucoma screen--no glaucoma per pt.

## 2014-07-18 ENCOUNTER — Encounter: Payer: Self-pay | Admitting: Family Medicine

## 2014-07-26 ENCOUNTER — Other Ambulatory Visit: Payer: Self-pay | Admitting: Physician Assistant

## 2014-07-26 NOTE — Telephone Encounter (Signed)
Medication refilled per protocol. 

## 2014-10-14 DIAGNOSIS — N401 Enlarged prostate with lower urinary tract symptoms: Secondary | ICD-10-CM | POA: Diagnosis not present

## 2014-10-14 DIAGNOSIS — R351 Nocturia: Secondary | ICD-10-CM | POA: Diagnosis not present

## 2014-10-14 DIAGNOSIS — N390 Urinary tract infection, site not specified: Secondary | ICD-10-CM | POA: Diagnosis not present

## 2014-12-11 ENCOUNTER — Other Ambulatory Visit: Payer: Self-pay | Admitting: Physician Assistant

## 2014-12-11 NOTE — Telephone Encounter (Signed)
Refill appropriate and filled per protocol. 

## 2014-12-29 ENCOUNTER — Other Ambulatory Visit: Payer: Self-pay | Admitting: Physician Assistant

## 2014-12-30 ENCOUNTER — Encounter: Payer: Self-pay | Admitting: Family Medicine

## 2014-12-30 NOTE — Telephone Encounter (Signed)
Medication refill for one time only.  Patient needs to be seen.  Letter sent for patient to call and schedule 

## 2015-01-06 ENCOUNTER — Ambulatory Visit (INDEPENDENT_AMBULATORY_CARE_PROVIDER_SITE_OTHER): Payer: Medicare Other | Admitting: Physician Assistant

## 2015-01-06 ENCOUNTER — Encounter: Payer: Self-pay | Admitting: Physician Assistant

## 2015-01-06 VITALS — BP 160/84 | HR 80 | Temp 98.3°F | Resp 18 | Ht 66.0 in | Wt 200.0 lb

## 2015-01-06 DIAGNOSIS — R931 Abnormal findings on diagnostic imaging of heart and coronary circulation: Secondary | ICD-10-CM

## 2015-01-06 DIAGNOSIS — E559 Vitamin D deficiency, unspecified: Secondary | ICD-10-CM | POA: Diagnosis not present

## 2015-01-06 DIAGNOSIS — R011 Cardiac murmur, unspecified: Secondary | ICD-10-CM | POA: Diagnosis not present

## 2015-01-06 DIAGNOSIS — J439 Emphysema, unspecified: Secondary | ICD-10-CM | POA: Diagnosis not present

## 2015-01-06 DIAGNOSIS — I10 Essential (primary) hypertension: Secondary | ICD-10-CM | POA: Diagnosis not present

## 2015-01-06 DIAGNOSIS — K635 Polyp of colon: Secondary | ICD-10-CM | POA: Diagnosis not present

## 2015-01-06 DIAGNOSIS — E785 Hyperlipidemia, unspecified: Secondary | ICD-10-CM

## 2015-01-06 NOTE — Progress Notes (Signed)
Patient ID: Patrick Villa MRN: [id number], DOB: April 21, 1933, 81 y.o. Date of Encounter: @DATE @  Chief Complaint:  Chief Complaint  Patient presents with  . 6 mth check up    is fasting    HPI: 81 y.o. year old AA male presents for routine followup office visit.   At visit 07/17/14 he says that he has quite a garden. Says that he has been having problems with groundhogs getting into it. Has corn, squash, corn, cucumbers, tomatoes, green beans etc.  The only other doctor that he has seen since then his urology. He sees them routinely. Has not needed to see any other types of medical providers.  List the Names of Other Physician/Practitioners you currently use:  Sees urology once a year.  [place] ever 1 -2 years for screening. No glaucoma.  No other providers now.    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 Dietary issues discussed:   Eats pretty healthy.     He checks his blood pressure at home and sometimes gets 117 and 120s. Sometimes gives him more like 138-142. Taking his blood pressure medicines as directed with no adverse effects. No lightheadedness. No lower extremity edema.  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 He has no other exercise. With this exertion he has no chest pressure or heaviness no chest tightness or squeezing. No increased shortness of breath or dyspnea on exertion. 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    Past Medical History  Diagnosis Date  . Hypertension   . COPD (chronic obstructive pulmonary disease)   . Hyperlipidemia   . Colon polyps   . Systolic murmur   . Echocardiogram abnormal   . Hematuria   . Ganglion cyst            Outpatient Prescriptions Prior to Visit  Medication Sig Dispense  Refill  . aspirin 325 MG tablet Take 325 mg by mouth daily.      . benazepril-hydrochlorthiazide (LOTENSIN HCT) 20-25 MG per tablet TAKE 1 TABLET BY MOUTH EVERY DAY 30 tablet 5  . Cholecalciferol (VITAMIN D) 2000 UNITS CAPS Take 1 capsule (2,000 Units total) by mouth daily. 30 capsule 11  . diltiazem (CARDIZEM) 90 MG tablet TAKE 1 TABLET BY MOUTH THREE TIMES DAILY 90 tablet 0  . terazosin (HYTRIN) 5 MG capsule Take 1 capsule by mouth daily.    . clotrimazole-betamethasone (LOTRISONE) cream Apply 1 application topically 2 (two) times daily. 30 g 0   No facility-administered medications prior to visit.    Allergies:  Allergies  Allergen Reactions  . Amlodipine Besy-Benazepril Hcl     Jittery, gets confused.  . Lipitor [Atorvastatin Calcium]     Weakness.  . Zocor [Simvastatin]   . Bactrim [Sulfamethoxazole-Tmp Ds] Rash  . Pravachol Hives and Rash    History   Social History  . Marital Status: Widowed    Spouse Name: N/A    Number of Children: N/A  . Years of Education: N/A             Physical Exam: Blood pressure 134/76, pulse 68, temperature 97.8 F (36.6 C), temperature source Oral, resp. rate 18, height 5\' 5"  (1.651 m), weight 187 lb (84.823 kg)., Body mass index is 31.12 kg/(m^2). General: Very pleasant well-nourished well-developed African American male .Appears in no  acute distress. Neck: Supple. No thyromegaly. No lymphadenopathy. No carotid bruits. Lungs: Clear bilaterally to auscultation without wheezes, rales, or rhonchi. Breathing is unlabored. Heart: RRR with S1 S2. No murmurs, rubs, or gallops. I noted a 1/6 murmur in the past but really hear no murmur today. Abdomen: Soft, non-tender, non-distended with normoactive bowel sounds. No hepatomegaly. No rebound/guarding. No obvious abdominal masses. Musculoskeletal: Strength and tone normal for age. Extremities/Skin: Warm and dry. No clubbing or cyanosis. No  edema. No rashes or suspicious lesions. Neuro: Alert and oriented X 3. Moves all extremities spontaneously. Gait is normal. CNII-XII grossly in tact. Psych: Responds to questions appropriately with a normal affect.     81 y.o. year old male with  1. Hypertension BP good today . He is getting good readings at home. Continue current medication. - BMET  2. Hyperlipidemia He has a history of intolerance to Zocor, Lipitor, Pravachol. In the past he tolerated Crestor but quit this secondary to cost. He is been off of medication since April 2009. We had rechecked his lipids since been off of medication --checked 09/2012--off medication--LDL was 122. numbers were good so we decided he was fine to stay off of medication.  He rechecked FLP at [facility] 12/2013--LDL-113.   3. Vitamin D Deficiency:  At labs 12/2013 recommended to take vitamin D 2000 IUs daily. Patient states that he is taking this at this dose. Recheck lab 12/2014  3. Colon polyps Last colonoscopy was June 2012. To repeat 3 years which will be June 2015. I wrote on his AVS at [place] 05/31/13 to call Dr.Buccini/[facility] to schedule.  Pt says he had colonoscopy performed--2015--not sure if supposed to repeat 5 years or 10 years. I do not see report scanned in Epic  4. Systolic murmur Last echo was June 2011. See below.  At [facility] 01/06/2015--Exam--soft murmur--does not need f/u Echo now  5. Echocardiogram abnormal Echo June 2011 showed normal LV function. Mild aortic sclerosis. Trace MR. Trace TR. At [facility] 01/06/2015--Exam--soft murmur--does not need f/u Echo now   6 BPH/hematuria  --He sees urology routinely for these  7. Preventive Care A.  Screening Labs: PSA is followed by urology routinely Fasting, Screening Labs--12/2013  B. Prostate Cancer Screening: Per Urology  C. Colorectal cancer screening: Last colonoscopy was June 2012. To repeat 3 years which will be June 2015. I wrote on his AVS at [place] 05/31/13 to call  Dr.Buccini/[facility] to schedule.  Pt says he had colonoscopy performed--2015--not sure if supposed to repeat 5 years or 10 years. I do not see report scanned in Epic  D. Immunizations A Pneumovax--- November 2012.  Prevnar 13--given here 05/31/2013 B Tetanus--- May 2012 C Zostavax: I discussed this again with him today. He said he did check with his insurance company but it was going to cost significant cost to him he is deferring. D influenza vaccine: He received --12/2013, 09/2014    [facility] for Routine Eye Checks--Has had Glaucoma screen--no glaucoma per pt.

## 2015-01-07 LAB — BASIC METABOLIC PANEL WITH GFR
BUN: 17 mg/dL (ref 7–25)
CALCIUM: 9.8 mg/dL (ref 8.6–10.3)
CO2: 28 mmol/L (ref 20–31)
Chloride: 104 mmol/L (ref 98–110)
Creat: 1.14 mg/dL (ref 0.70–1.18)
GFR, EST NON AFRICAN AMERICAN: 61 mL/min (ref >=60)
GFR, Est African American: 70 mL/min (ref >=60)
GLUCOSE: 81 mg/dL (ref 70–99)
Potassium: 3.7 mmol/L (ref 3.5–5.3)
Sodium: 142 mmol/L (ref 135–146)

## 2015-01-07 LAB — VITAMIN D 25 HYDROXY (VIT D DEFICIENCY, FRACTURES): Vit D, 25-Hydroxy: 39 ng/mL (ref 30–100)

## 2015-01-28 ENCOUNTER — Other Ambulatory Visit: Payer: Self-pay | Admitting: Family Medicine

## 2015-01-28 MED ORDER — BENAZEPRIL-HYDROCHLOROTHIAZIDE 20-25 MG PO TABS: 1.0000 | ORAL_TABLET | Freq: Every day | ORAL | Status: DC

## 2015-01-28 NOTE — Telephone Encounter (Signed)
Medication refilled per protocol. 

## 2015-01-30 DIAGNOSIS — M7671 Peroneal tendinitis, right leg: Secondary | ICD-10-CM | POA: Diagnosis not present

## 2015-01-30 DIAGNOSIS — M65871 Other synovitis and tenosynovitis, right ankle and foot: Secondary | ICD-10-CM | POA: Diagnosis not present

## 2015-01-30 DIAGNOSIS — M25571 Pain in right ankle and joints of right foot: Secondary | ICD-10-CM | POA: Diagnosis not present

## 2015-01-30 DIAGNOSIS — M12271 Villonodular synovitis (pigmented), right ankle and foot: Secondary | ICD-10-CM | POA: Diagnosis not present

## 2015-03-21 ENCOUNTER — Ambulatory Visit (INDEPENDENT_AMBULATORY_CARE_PROVIDER_SITE_OTHER): Payer: Medicare Other | Admitting: Family Medicine

## 2015-03-21 ENCOUNTER — Encounter: Payer: Self-pay | Admitting: Family Medicine

## 2015-03-21 VITALS — BP 140/82 | HR 72 | Temp 99.7°F | Resp 16 | Ht 66.0 in | Wt 198.0 lb

## 2015-03-21 DIAGNOSIS — N401 Enlarged prostate with lower urinary tract symptoms: Secondary | ICD-10-CM | POA: Diagnosis not present

## 2015-03-21 DIAGNOSIS — N138 Other obstructive and reflux uropathy: Secondary | ICD-10-CM | POA: Diagnosis not present

## 2015-03-21 DIAGNOSIS — Z0000 Encounter for general adult medical examination without abnormal findings: Secondary | ICD-10-CM | POA: Diagnosis not present

## 2015-03-21 DIAGNOSIS — J0100 Acute maxillary sinusitis, unspecified: Secondary | ICD-10-CM

## 2015-03-21 DIAGNOSIS — R310 Gross hematuria: Secondary | ICD-10-CM | POA: Diagnosis not present

## 2015-03-21 DIAGNOSIS — M2550 Pain in unspecified joint: Secondary | ICD-10-CM

## 2015-03-21 MED ORDER — GUAIFENESIN ER 600 MG PO TB12: 600.0000 mg | ORAL_TABLET | Freq: Two times a day (BID) | ORAL | Status: DC

## 2015-03-21 MED ORDER — METHYLPREDNISOLONE ACETATE 40 MG/ML IJ SUSP
40.0000 mg | Freq: Once | INTRAMUSCULAR | Status: AC
  Administered 2015-03-21: 40 mg via INTRAMUSCULAR

## 2015-03-21 MED ORDER — AMOXICILLIN 875 MG PO TABS: 875.0000 mg | ORAL_TABLET | Freq: Two times a day (BID) | ORAL | Status: DC

## 2015-03-21 MED ORDER — CETIRIZINE HCL 10 MG PO TABS: 10.0000 mg | ORAL_TABLET | Freq: Every day | ORAL | Status: DC

## 2015-03-21 NOTE — Patient Instructions (Signed)
Take antibiotics Take zyrtec Steroid shot given Try PLAIN  Mucinex  Twice a day  F/U as needed

## 2015-03-21 NOTE — Progress Notes (Signed)
Patient ID: Patrick Villa, male   DOB: 07/25/1933, 82 y.o.   MRN: [id number]   Subjective:    Patient ID: Patrick Villa, male    DOB: 1933/12/14, 82 y.o.   MRN: [id number]  Patient presents for Illness and Hip Pain patient here with nasal pressure pressure behind his eyes difficulty breathing out of his nose for the past few days. He has history of chronic sinusitis as had sinus surgery in the past. He's been told never to use any nasal sprays because of his difficulties in the past. He did not take anything over-the-counter he's not had any fever no cough. He did feel a little achy in his bilateral hips for the past 2 days. No known sick contacts.   Review Of Systems:  GEN- denies fatigue, fever, weight loss,weakness, recent illness HEENT- denies eye drainage, change in vision, +nasal discharge, CVS- denies chest pain, palpitations RESP- denies SOB, cough, wheeze ABD- denies N/V, change in stools, abd pain GU- denies dysuria, hematuria, dribbling, incontinence MSK- +joint pain, muscle aches, injury Neuro- + headache, dizziness, syncope, seizure activity       Objective:    BP 140/82 mmHg  Pulse 72  Temp(Src) 99.7 F (37.6 C) (Oral)  Resp 16  Ht 5\' 6"  (1.676 m)  Wt 198 lb (89.812 kg)  BMI 31.97 kg/m2 GEN- NAD, alert and oriented x3 HEENT- PERRL, EOMI, non injected sclera, pink conjunctiva, MMM, oropharynx mild injection, TM clear bilat no effusion,  + maxillary sinus tenderness, inflammed turbinates,  Nasal drainage  Neck- Supple, no LAD CVS- RRR, no murmur RESP-CTAB MSK- Spine NT, Fair ROM lower ext and spine, neg SLR, no pain with Palpation of hips or IR/ER, fair ROM knees, no effusion EXT- No edema Pulses- Radial 2+          Assessment & Plan:      Problem List Items Addressed This Visit    None    Visit Diagnoses    Acute maxillary sinusitis, recurrence not specified    -  Primary    Sinusitis based on symptoms and previous ENT history , can not use nasal  sprays, will give Depo Medrol see if this helps with inflammation, no decongestant due to his multiple hypertension meds. Mucinex, Amox, zyrtec samples given from office     Relevant Medications    amoxicillin (AMOXIL) 875 MG tablet    cetirizine (ZYRTEC) 10 MG tablet    guaiFENesin (MUCINEX) 600 MG 12 hr tablet    methylPREDNISolone acetate (DEPO-MEDROL) injection 40 mg (Completed)    Joint pain        Normal exam, conincides with illness but not true Influenza symptoms, will monitor, Depo Medrol may help       Note: This dictation was prepared with Dragon dictation along with smaller phrase technology. Any transcriptional errors that result from this process are unintentional.

## 2015-03-26 DIAGNOSIS — R310 Gross hematuria: Secondary | ICD-10-CM | POA: Diagnosis not present

## 2015-03-26 DIAGNOSIS — R3129 Other microscopic hematuria: Secondary | ICD-10-CM | POA: Diagnosis not present

## 2015-03-28 ENCOUNTER — Other Ambulatory Visit: Payer: Self-pay | Admitting: Family Medicine

## 2015-03-28 NOTE — Telephone Encounter (Signed)
Request refused.   Patient should contact office first.

## 2015-04-07 DIAGNOSIS — R310 Gross hematuria: Secondary | ICD-10-CM | POA: Diagnosis not present

## 2015-04-07 DIAGNOSIS — N138 Other obstructive and reflux uropathy: Secondary | ICD-10-CM | POA: Diagnosis not present

## 2015-04-07 DIAGNOSIS — Z0000 Encounter for general adult medical examination without abnormal findings: Secondary | ICD-10-CM | POA: Diagnosis not present

## 2015-04-07 DIAGNOSIS — N401 Enlarged prostate with lower urinary tract symptoms: Secondary | ICD-10-CM | POA: Diagnosis not present

## 2015-04-28 ENCOUNTER — Ambulatory Visit
Admission: RE | Admit: 2015-04-28 | Discharge: 2015-04-28 | Disposition: A | Discharge status: Home / Self Care | Payer: Medicare Other | Source: Ambulatory Visit | Attending: Physician Assistant | Admitting: Physician Assistant

## 2015-04-28 ENCOUNTER — Encounter: Payer: Self-pay | Admitting: Physician Assistant

## 2015-04-28 ENCOUNTER — Ambulatory Visit (INDEPENDENT_AMBULATORY_CARE_PROVIDER_SITE_OTHER): Payer: Medicare Other | Admitting: Physician Assistant

## 2015-04-28 VITALS — BP 144/84 | HR 64 | Temp 98.7°F | Resp 18 | Wt 194.0 lb

## 2015-04-28 DIAGNOSIS — M5441 Lumbago with sciatica, right side: Secondary | ICD-10-CM | POA: Diagnosis not present

## 2015-04-28 DIAGNOSIS — M545 Low back pain: Secondary | ICD-10-CM | POA: Diagnosis not present

## 2015-04-28 DIAGNOSIS — H109 Unspecified conjunctivitis: Secondary | ICD-10-CM | POA: Diagnosis not present

## 2015-04-28 MED ORDER — BACITRACIN-POLYMYXIN B 500-10000 UNIT/GM OP OINT: 1.0000 "application " | TOPICAL_OINTMENT | Freq: Two times a day (BID) | OPHTHALMIC | Status: DC

## 2015-04-28 NOTE — Progress Notes (Addendum)
Patient ID: Patrick Villa MRN: [id number], DOB: 04-17-1933, 82 y.o. Date of Encounter: 04/28/2015, 3:27 PM    Chief Complaint:  Chief Complaint  Patient presents with  . c/o both lower legs    tingling sensations     HPI: [name] presents with above.   Patient states that when he is up doing activity during the day he does not feel any type of discomfort in his legs. However says that when he sits and relaxes he feels tingling sensation in the right lower leg and points to the right shin region. 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 He does not feel pain shooting down the legs at those times.  He has no pain numbness or tingling on the bottoms of his feet or the feet themselves at all.  No weakness in the legs or feet.  He also states that for the past couple of mornings when he has woken up his eyes have been matted together bilaterally. No nasal congestion or mucus from the nose. No sore throat. No fevers or chills. No rash on the face or nose.   ADDENDUM ADDED 05/31/2015: Received OV note from Dr. Joya Salm at [facility]--OV note dated 05/12/15.  "Clinically, pt is doing really well, except for sensory changes. Information given for him to read. Told him that unless symptoms get worse, at present time no need to do surgery. Gave book about exercises, tips about dos and don'ts. He is going to give me a call and let me know how he is doing. "   Home Meds:   Outpatient Prescriptions Prior to Visit  Medication Sig Dispense Refill  . aspirin 325 MG tablet Take 325 mg by mouth daily.      . benazepril-hydrochlorthiazide (LOTENSIN HCT) 20-25 MG tablet Take 1 tablet by mouth daily. 90 tablet 1  . Cholecalciferol (VITAMIN D) 2000 UNITS CAPS Take 1 capsule (2,000 Units total) by mouth daily. 30 capsule 11  . diltiazem (CARDIZEM) 90 MG tablet TAKE 1  TABLET BY MOUTH THREE TIMES DAILY 90 tablet 0  . terazosin (HYTRIN) 5 MG capsule Take 1 capsule by mouth daily.    . cetirizine (ZYRTEC) 10 MG tablet Take 1 tablet (10 mg total) by mouth daily. (Patient not taking: Reported on 04/28/2015) 30 tablet 11  . guaiFENesin (MUCINEX) 600 MG 12 hr tablet Take 1 tablet (600 mg total) by mouth 2 (two) times daily. (Patient not taking: Reported on 04/28/2015) 30 tablet 0  . amoxicillin (AMOXIL) 875 MG tablet Take 1 tablet (875 mg total) by mouth 2 (two) times daily. 14 tablet 0   No facility-administered medications prior to visit.    Allergies:  Allergies  Allergen Reactions  . Amlodipine Besy-Benazepril Hcl     Jittery, gets confused.  . Lipitor [Atorvastatin Calcium]     Weakness.  . Zocor [Simvastatin]   . Bactrim [Sulfamethoxazole-Trimethoprim] Rash  . Pravachol Hives and Rash      Review of Systems: See HPI for pertinent ROS. All other ROS negative.    Physical Exam: Blood pressure 144/84, pulse 64, temperature 98.7 F (37.1 C), temperature source Oral, resp. rate 18, weight 194 lb (87.998 kg)., Body mass index is 31.33 kg/(m^2). General:  WNWD AAM. Appears in no acute distress. HEENT: Normocephalic, atraumatic, eyes with mild diffuse conjunctival injection bilaterally.  nares are without discharge.  Bilateral auditory canals clear, TM's are without perforation, pearly grey and translucent with reflective cone of light bilaterally. Oral cavity moist, posterior pharynx without exudate, erythema, peritonsillar abscess, or post nasal drip.  Neck: Supple. No thyromegaly. No lymphadenopathy. Lungs: Clear bilaterally to auscultation without wheezes, rales, or rhonchi. Breathing is unlabored. Heart: Regular rhythm. No murmurs, rubs, or gallops. Msk:  Strength and tone normal for age. Extremities/Skin: Warm and dry.  No LE edema. No rashes or suspicious lesions. Sensation intact of feet, legs.  Neuro: Alert and oriented X 3. Moves all extremities  spontaneously. Gait is normal. CNII-XII grossly in tact. Psych:  Responds to questions appropriately with a normal affect.     ASSESSMENT AND PLAN:  82 y.o. year old male with  1. Bilateral low back pain with right-sided sciatica Symptoms sound consistent with sciatica. Will obtain lumbar spine x-ray. Will follow-up patient wanted to get these results. - DG Lumbar Spine Complete; Future ADDENDUM ADDED 05/31/2015: Received OV note from Dr. Joya Salm at [facility]--OV note dated 05/12/15.  "Clinically, pt is doing really well, except for sensory changes. Information given for him to read. Told him that unless symptoms get worse, at present time no need to do surgery. Gave book about exercises, tips about dos and don'ts. He is going to give me a call and let me know how he is doing. "    2. Bilateral conjunctivitis He is to apply the Polysporin as directed. If develops additional or worsening symptoms and follow-up immediately or follow-up if symptoms do not resolve in several days. - bacitracin-polymyxin b (POLYSPORIN) ophthalmic ointment; Place 1 application into both eyes every 12 (twelve) hours. apply to eye every 12 hours while awake  Dispense: 3.5 g; Refill: 0   Signed, [address] [place], [place], [facility] 04/28/2015 3:27 PM

## 2015-04-30 ENCOUNTER — Other Ambulatory Visit: Payer: Self-pay | Admitting: Family Medicine

## 2015-04-30 DIAGNOSIS — M5441 Lumbago with sciatica, right side: Secondary | ICD-10-CM

## 2015-05-07 ENCOUNTER — Ambulatory Visit
Admission: RE | Admit: 2015-05-07 | Discharge: 2015-05-07 | Disposition: A | Discharge status: Home / Self Care | Payer: Medicare Other | Source: Ambulatory Visit | Attending: Physician Assistant | Admitting: Physician Assistant

## 2015-05-07 DIAGNOSIS — M5441 Lumbago with sciatica, right side: Secondary | ICD-10-CM

## 2015-05-07 DIAGNOSIS — M4806 Spinal stenosis, lumbar region: Secondary | ICD-10-CM | POA: Diagnosis not present

## 2015-05-08 ENCOUNTER — Other Ambulatory Visit: Payer: Self-pay | Admitting: Family Medicine

## 2015-05-08 DIAGNOSIS — M545 Low back pain: Secondary | ICD-10-CM

## 2015-05-08 DIAGNOSIS — R937 Abnormal findings on diagnostic imaging of other parts of musculoskeletal system: Secondary | ICD-10-CM

## 2015-05-12 DIAGNOSIS — M549 Dorsalgia, unspecified: Secondary | ICD-10-CM | POA: Diagnosis not present

## 2015-05-12 DIAGNOSIS — I10 Essential (primary) hypertension: Secondary | ICD-10-CM | POA: Diagnosis not present

## 2015-05-12 DIAGNOSIS — M4316 Spondylolisthesis, lumbar region: Secondary | ICD-10-CM | POA: Diagnosis not present

## 2015-05-15 ENCOUNTER — Other Ambulatory Visit: Payer: Self-pay | Admitting: Physician Assistant

## 2015-05-15 NOTE — Telephone Encounter (Signed)
Refill appropriate and filled per protocol. 

## 2015-06-04 ENCOUNTER — Other Ambulatory Visit: Payer: Self-pay | Admitting: Physician Assistant

## 2015-06-04 NOTE — Telephone Encounter (Signed)
Medication refilled per protocol. 

## 2015-07-07 ENCOUNTER — Other Ambulatory Visit: Payer: Self-pay

## 2015-07-09 ENCOUNTER — Encounter: Payer: Self-pay | Admitting: Physician Assistant

## 2015-07-09 ENCOUNTER — Ambulatory Visit (INDEPENDENT_AMBULATORY_CARE_PROVIDER_SITE_OTHER): Payer: Medicare Other | Admitting: Physician Assistant

## 2015-07-09 VITALS — BP 158/76 | HR 60 | Temp 97.7°F | Resp 18 | Ht 66.0 in | Wt 196.0 lb

## 2015-07-09 DIAGNOSIS — Z0000 Encounter for general adult medical examination without abnormal findings: Secondary | ICD-10-CM | POA: Diagnosis not present

## 2015-07-09 DIAGNOSIS — E785 Hyperlipidemia, unspecified: Secondary | ICD-10-CM

## 2015-07-09 DIAGNOSIS — E559 Vitamin D deficiency, unspecified: Secondary | ICD-10-CM | POA: Diagnosis not present

## 2015-07-09 DIAGNOSIS — R011 Cardiac murmur, unspecified: Secondary | ICD-10-CM

## 2015-07-09 DIAGNOSIS — K635 Polyp of colon: Secondary | ICD-10-CM

## 2015-07-09 DIAGNOSIS — J439 Emphysema, unspecified: Secondary | ICD-10-CM

## 2015-07-09 DIAGNOSIS — I10 Essential (primary) hypertension: Secondary | ICD-10-CM | POA: Diagnosis not present

## 2015-07-09 LAB — CBC WITH DIFFERENTIAL/PLATELET
BASOS ABS: 0 {cells}/uL (ref 0–200)
BASOS PCT: 0 %
EOS ABS: 462 {cells}/uL (ref 15–500)
Eosinophils Relative: 6 %
HCT: 39 % (ref 38.5–50.0)
Hemoglobin: 12.7 g/dL — ABNORMAL LOW (ref 13.0–17.0)
LYMPHS PCT: 29 %
Lymphs Abs: 2233 cells/uL (ref 850–3900)
MCH: 27.8 pg (ref 27.0–33.0)
MCHC: 32.6 g/dL (ref 32.0–36.0)
MCV: 85.3 fL (ref 80.0–100.0)
MONOS PCT: 8 %
MPV: 9.4 fL (ref 7.5–12.5)
Monocytes Absolute: 616 cells/uL (ref 200–950)
Neutro Abs: 4389 cells/uL (ref 1500–7800)
Neutrophils Relative %: 57 %
PLATELETS: 231 10*3/uL (ref 140–400)
RBC: 4.57 MIL/uL (ref 4.20–5.80)
RDW: 14.9 % (ref 11.0–15.0)
WBC: 7.7 10*3/uL (ref 3.8–10.8)

## 2015-07-09 NOTE — Progress Notes (Signed)
Patient ID: Patrick Villa MRN: [id number], DOB: 19-Dec-1933, 82 y.o. Date of Encounter: @DATE @  Chief Complaint:  Chief Complaint  Patient presents with  . CPE        HPI: [name] presents for CPE/ Medicare Annual Exam  At visit 07/17/78 he says that he has quite a garden. Says that he has been having problems with groundhogs getting into it. Has corn, squash, corn, cucumbers, tomatoes, green beans etc.  At OV 6/80/2017---he has garden again---same vegetables as above. Also, beets.  Says he even has some flowers now--didn't use to have flower garden.  Not working as much now. "Retired" but they still call him some. Daughter took over the business.  The only other doctor that he has seen since then his urology. He sees them routinely. Has not needed to see any other types of medical providers.  List the Names of Other Physician/Practitioners you currently use:  Sees urology once a year.  [place] ever 1 -2 years for screening. No glaucoma.  No other providers now.    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 Dietary issues discussed:   Eats pretty healthy.     He checks his blood pressure at home and sometimes gets 117 and 120s. Sometimes gives him more like 138-142. Taking his blood pressure medicines as directed with no adverse effects. No lightheadedness. No lower extremity edema. Discussed that BP a little high today--he says sometimes when he checks it early morning, it's a little high but later in day runs ~ [id number] systolic.  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  He is "retired".  Stays busy with garden, etc. He has no other exercise. With this exertion he has no chest pressure or heaviness no chest tightness or squeezing. No increased shortness of breath or dyspnea on exertion. 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    Past Medical History  Diagnosis Date  . Hypertension   . COPD (chronic obstructive pulmonary disease)   . Hyperlipidemia   . Colon polyps   . Systolic murmur   . Echocardiogram abnormal   . Hematuria   . Ganglion cyst            Outpatient Prescriptions Prior to Visit  Medication Sig Dispense Refill  . aspirin 325 MG tablet Take 325 mg by mouth daily.      . benazepril-hydrochlorthiazide (LOTENSIN HCT) 20-25 MG tablet Take 1 tablet by mouth daily. 90 tablet 1  . Cholecalciferol (VITAMIN D) 2000 UNITS CAPS Take 1 capsule (2,000 Units total) by mouth daily. 30 capsule 11  . diltiazem (CARDIZEM) 90 MG tablet TAKE 1 TABLET BY MOUTH THREE TIMES DAILY 90 tablet 0  . finasteride (PROSCAR) 5 MG tablet TK 1 T PO QD  11  . terazosin (HYTRIN) 10 MG capsule TK 1 C PO QD HS  6  . bacitracin-polymyxin b (POLYSPORIN) ophthalmic ointment Place 1 application into both eyes every 12 (twelve) hours. apply to eye every 12 hours while awake 3.5 g 0  . cetirizine (ZYRTEC) 10 MG tablet Take 1 tablet (10 mg total) by mouth daily. (Patient not taking: Reported on 04/28/2015) 30 tablet 11  . guaiFENesin (MUCINEX) 600 MG 12 hr tablet Take 1 tablet (600 mg total) by mouth 2 (two) times daily. (Patient not taking:  Reported on 04/28/2015) 30 tablet 0   No facility-administered medications prior to visit.    Allergies:  Allergies  Allergen Reactions  . Amlodipine Besy-Benazepril Hcl     Jittery, gets confused.  . Lipitor [Atorvastatin Calcium]     Weakness.  . Zocor [Simvastatin]   . Bactrim [Sulfamethoxazole-Tmp Ds] Rash  . Pravachol Hives and Rash    History   Social History  . Marital Status: Widowed    Spouse Name: N/A    Number of Children: N/A  . Years of Education: N/A             Physical Exam: Blood pressure 134/76, pulse 68, temperature 97.8  F (36.6 C), temperature source Oral, resp. rate 18, height 5\' 5"  (1.651 m), weight 187 lb (84.823 kg)., Body mass index is 31.12 kg/(m^2). General: Very pleasant well-nourished well-developed African American male .Appears in no acute distress. Neck: Supple. No thyromegaly. No lymphadenopathy. No carotid bruits. Lungs: Clear bilaterally to auscultation without wheezes, rales, or rhonchi. Breathing is unlabored. Heart: RRR with S1 S2. No murmurs, rubs, or gallops. I noted a 1/6 murmur in the past but really hear no murmur today. Abdomen: Soft, non-tender, non-distended with normoactive bowel sounds. No hepatomegaly. No rebound/guarding. No obvious abdominal masses. Musculoskeletal: Strength and tone normal for age. Extremities/Skin: Warm and dry. No clubbing or cyanosis. No edema. No rashes or suspicious lesions. Neuro: Alert and oriented X 3. Moves all extremities spontaneously. Gait is normal. CNII-XII grossly in tact. Psych: Responds to questions appropriately with a normal affect.     82 y.o. year old male with  1. Hypertension BP a little high today . He is getting good readings at home. Continue current medication. - BMET  2. Hyperlipidemia He has a history of intolerance to Zocor, Lipitor, Pravachol. In the past he tolerated Crestor but quit this secondary to cost. He is been off of medication since April 2009. We had rechecked his lipids since been off of medication --checked 09/2012--off medication--LDL was 122. numbers were good so we decided he was fine to stay off of medication.  He rechecked FLP at [facility] 12/2013--LDL-113.   3. Vitamin D Deficiency:  At labs 12/2013 recommended to take vitamin D 2000 IUs daily. Patient states that he is taking this at this dose. Recheck lab 12/2014--Vit D level WNL---to cont same dose---can wait to recheck lab level  3. Colon polyps Last colonoscopy was June 2012. To repeat 3 years which will be June 2015. I wrote on his AVS at [place]  05/31/13 to call Dr.Buccini/[facility] to schedule.  Pt says he had colonoscopy performed--2015--not sure if supposed to repeat 5 years or 10 years. I do not see report scanned in Epic  4. Systolic murmur Last echo was June 2011. See below.  At [place] 01/06/2015--Exam--soft murmur--does not need f/u Echo now  5. Echocardiogram abnormal Echo June 2011 showed normal LV function. Mild aortic sclerosis. Trace MR. Trace TR. At [place] 01/06/2015--Exam--soft murmur--does not need f/u Echo now   6 BPH/hematuria  --He sees urology routinely for these  7. Preventive Care A.  Screening Labs: PSA is followed by urology routinely Fasting, Screening Labs--12/2013  B. Prostate Cancer Screening: Per Urology  C. Colorectal cancer screening: Last colonoscopy was June 2012. To repeat 3 years which will be June 2015. I wrote on his AVS at [place] 05/31/13 to call Dr.Buccini/[facility] to schedule.  Pt says he had colonoscopy performed--2015--not sure if supposed to repeat 5 years or 10 years. I do  not see report scanned in Epic  D. Immunizations A Pneumovax--- November 2012.  Prevnar 13--given here 05/31/2013 B Tetanus--- May 2012 C Zostavax: I discussed this again with him today. He said he did check with his insurance company but it was going to cost significant cost to him he is deferring. D influenza vaccine: He received --12/2013, 09/2014    [facility] for Routine Eye Checks--Has had Glaucoma screen--no glaucoma per pt.  Subjective:   Patient presents for Medicare Annual/Subsequent preventive examination.   Review Past Medical/Family/Social: These are all reviewed, updated today  Risk Factors  Current exercise habits: Gardens Dietary issues discussed: Eats lots of vegetables. Eats low fat, low carb diet  Cardiac risk factors: HTN, Male, Age  Depression Screen  (Note: if answer to either of the following is "Yes", a more complete depression screening is indicated)  Over the past two  weeks, have you felt down, depressed or hopeless? No Over the past two weeks, have you felt little interest or pleasure in doing things? No Have you lost interest or pleasure in daily life? No Do you often feel hopeless? No Do you cry easily over simple problems? No   Activities of Daily Living  In your present state of health, do you have any difficulty performing the following activities?:  Driving? No  Managing money? No  Feeding yourself? No  Getting from bed to chair? No  Climbing a flight of stairs? No  Preparing food and eating?: No  Bathing or showering? No  Getting dressed: No  Getting to the toilet? No  Using the toilet:No  Moving around from place to place: No  In the past year have you fallen or had a near fall?:No  Are you sexually active? No  Do you have more than one partner? No   Hearing Difficulties: No  Do you often ask people to speak up or repeat themselves? No  Do you experience ringing or noises in your ears? No Do you have difficulty understanding soft or whispered voices? No  Do you feel that you have a problem with memory? No Do you often misplace items? No  Do you feel safe at home? Yes  Cognitive Testing  Alert? Yes Normal Appearance?Yes  Oriented to person? Yes Place? Yes  Time? Yes  Recall of three objects? Yes  Can perform simple calculations? Yes  Displays appropriate judgment?Yes  Can read the correct time from a watch face?Yes   List the Names of Other Physician/Practitioners you currently use:  These are listed above in HPI  Indicate any recent Medical Services you may have received from other than [facility] providers in the past year (date may be approximate).  These are listed above Screening Tests / Date----These are documented above Colonoscopy                     Zostavax  Mammogram  Influenza Vaccine  Tetanus/tdap    Assessment:    Annual wellness medicare exam   Plan:    During the course of the visit the patient was  educated and counseled about appropriate screening and preventive services including:  Screening mammography  Colorectal cancer screening  Shingles vaccine. Prescription given to that she can get the vaccine at the pharmacy or Medicare part D.  Screen + for depression. PHQ- 9 score of 12 (moderate depression). We discussed the options of counseling versus possibly a medication. I encouraged her strongly think about the counseling. She is going through some  medical problems currently and her husband is as well Mrs. been very stressful for her. She says she will think about it. She does have Xanax to use as needed. Though she may benefit from an SSRI for her more depressive type symptoms but she wants to hold off at this time.  I aksed her to please have her cardioloist send records since we have none on file.  Diet review for nutrition referral? Yes ____ Not Indicated __x__  Patient Instructions (the written plan) was given to the patient.  Medicare Attestation  I have personally reviewed:  The patient's medical and social history  Their use of alcohol, tobacco or illicit drugs  Their current medications and supplements  The patient's functional ability including ADLs,fall risks, home safety risks, cognitive, and hearing and visual impairment  Diet and physical activities  Evidence for depression or mood disorders  The patient's weight, height, BMI, and visual acuity have been recorded in the chart. I have made referrals, counseling, and provided education to the patient based on review of the above and I have provided the patient with a written personalized care plan for preventive services.

## 2015-07-10 LAB — COMPLETE METABOLIC PANEL WITH GFR
ALT: 17 U/L (ref 9–46)
AST: 18 U/L (ref 10–35)
Albumin: 4.1 g/dL (ref 3.6–5.1)
Alkaline Phosphatase: 52 U/L (ref 40–115)
BUN: 19 mg/dL (ref 7–25)
CHLORIDE: 103 mmol/L (ref 98–110)
CO2: 20 mmol/L (ref 20–31)
Calcium: 9.7 mg/dL (ref 8.6–10.3)
Creat: 1.06 mg/dL (ref 0.70–1.18)
GFR, EST AFRICAN AMERICAN: 77 mL/min (ref >=60)
GFR, EST NON AFRICAN AMERICAN: 66 mL/min (ref >=60)
Glucose, Bld: 109 mg/dL — ABNORMAL HIGH (ref 70–99)
POTASSIUM: 3.8 mmol/L (ref 3.5–5.3)
Sodium: 140 mmol/L (ref 135–146)
Total Bilirubin: 0.3 mg/dL (ref 0.2–1.2)
Total Protein: 7.1 g/dL (ref 6.1–8.1)

## 2015-07-10 LAB — TSH: TSH: 1.52 mIU/L (ref 0.40–4.50)

## 2015-07-14 DIAGNOSIS — R310 Gross hematuria: Secondary | ICD-10-CM | POA: Diagnosis not present

## 2015-07-14 DIAGNOSIS — N401 Enlarged prostate with lower urinary tract symptoms: Secondary | ICD-10-CM | POA: Diagnosis not present

## 2015-07-15 ENCOUNTER — Encounter: Payer: Self-pay | Admitting: Family Medicine

## 2015-07-21 ENCOUNTER — Other Ambulatory Visit: Payer: Self-pay | Admitting: Physician Assistant

## 2015-07-21 NOTE — Telephone Encounter (Signed)
Medication refilled per protocol. 

## 2015-09-08 ENCOUNTER — Other Ambulatory Visit: Payer: Self-pay | Admitting: Physician Assistant

## 2015-09-08 NOTE — Telephone Encounter (Signed)
Refill appropriate and filled per protocol. 

## 2015-12-02 ENCOUNTER — Other Ambulatory Visit: Payer: Self-pay | Admitting: Physician Assistant

## 2015-12-03 NOTE — Telephone Encounter (Signed)
Medication refilled per protocol. 

## 2016-01-12 ENCOUNTER — Ambulatory Visit (INDEPENDENT_AMBULATORY_CARE_PROVIDER_SITE_OTHER): Payer: Medicare Other | Admitting: Physician Assistant

## 2016-01-12 ENCOUNTER — Encounter: Payer: Self-pay | Admitting: Physician Assistant

## 2016-01-12 VITALS — BP 150/78 | HR 84 | Temp 97.9°F | Resp 16 | Wt 196.0 lb

## 2016-01-12 DIAGNOSIS — M545 Low back pain: Secondary | ICD-10-CM | POA: Diagnosis not present

## 2016-01-12 DIAGNOSIS — E559 Vitamin D deficiency, unspecified: Secondary | ICD-10-CM | POA: Diagnosis not present

## 2016-01-12 DIAGNOSIS — K635 Polyp of colon: Secondary | ICD-10-CM

## 2016-01-12 DIAGNOSIS — J439 Emphysema, unspecified: Secondary | ICD-10-CM | POA: Diagnosis not present

## 2016-01-12 DIAGNOSIS — G8929 Other chronic pain: Secondary | ICD-10-CM | POA: Diagnosis not present

## 2016-01-12 DIAGNOSIS — I10 Essential (primary) hypertension: Secondary | ICD-10-CM | POA: Diagnosis not present

## 2016-01-12 DIAGNOSIS — M542 Cervicalgia: Secondary | ICD-10-CM

## 2016-01-12 LAB — BASIC METABOLIC PANEL WITH GFR
BUN: 14 mg/dL (ref 7–25)
CALCIUM: 9.6 mg/dL (ref 8.6–10.3)
CO2: 29 mmol/L (ref 20–31)
Chloride: 105 mmol/L (ref 98–110)
Creat: 1.12 mg/dL — ABNORMAL HIGH (ref 0.70–1.11)
GFR, EST AFRICAN AMERICAN: 71 mL/min (ref >=60)
GFR, EST NON AFRICAN AMERICAN: 62 mL/min (ref >=60)
Glucose, Bld: 98 mg/dL (ref 70–99)
POTASSIUM: 4.1 mmol/L (ref 3.5–5.3)
SODIUM: 140 mmol/L (ref 135–146)

## 2016-01-12 MED ORDER — CYCLOBENZAPRINE HCL 10 MG PO TABS: 10.0000 mg | ORAL_TABLET | Freq: Three times a day (TID) | ORAL | 0 refills | Status: DC | PRN

## 2016-01-12 MED ORDER — NEBIVOLOL HCL 5 MG PO TABS: 5.0000 mg | ORAL_TABLET | Freq: Every day | ORAL | 5 refills | Status: DC

## 2016-01-12 NOTE — Progress Notes (Signed)
Patient ID: ATLAI ALVELO MRN: [id number], DOB: 06-19-1933, 82 y.o. Date of Encounter: @DATE @  Chief Complaint:  Chief Complaint  Patient presents with  . Back Pain    f/u  . left arm and shoulder pain    HPI: 82 y.o. year old male  presents for routine f/u OV.   Year after year he has told me that he races quite a garden. Corn, squash, cucumbers, tomatoes, green beans etc.  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  List the Names of Other Physician/Practitioners you currently use:  Sees urology once a year.  [place] ever 1 -2 years for screening. No glaucoma.  No other providers now.    The following is copied from his OV note with me 04/28/2015: Patient states that when he is up doing activity during the day he does not feel any type of discomfort in his legs.  However says that when he sits and relaxes he feels tingling sensation in the right lower leg and points to the right shin region. 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 He does not feel pain shooting down the legs at those times.  He has no pain numbness or tingling on the bottoms of his feet or the feet themselves at all.  No weakness in the legs or feet.  At that OV I ordered XRay Lumbar Spine. This showed abnormalities.  MRI was then obtained--showed the following:  FINDINGS: Negative for acute or chronic fracture. Negative for mass lesion. Conus medullaris normal and terminates at L1. Paraspinous soft tissues normal. Retroperitoneal structures negative.  L1-2:  Negative  L2-3: Mild disc bulging and mild facet degeneration. No significant spinal stenosis  L3-4: Slight  anterior slip. Moderate to advanced facet degeneration bilaterally. Diffuse disc bulging. Moderate spinal stenosis. Mild foraminal narrowing bilaterally  L4-5: 4 mm anterior slip with severe facet degeneration and diffuse bulging of the disc. Moderate spinal stenosis. Subarticular and foraminal encroachment of a moderate degree bilaterally. This is greater on the right than the left.  L5-S1: Disc bulging and spondylosis. Bilateral facet degeneration. Moderate foraminal encroachment bilaterally.  IMPRESSION: Moderate spinal stenosis at L3-4 with mild foraminal narrowing bilaterally  Grade 1 anterior slip L4-5 with moderate spinal stenosis. Subarticular and foraminal stenosis right greater than left.  Moderate foraminal encroachment bilaterally L5-S1 due to disc bulging and facet hypertrophy.   ADDENDUM ADDED 05/31/2015: Received OV note from Dr. Joya Salm at [facility]--OV note dated 05/12/15.  "Clinically, pt is doing really well, except for sensory changes. Information given for him to read. Told him that unless symptoms get worse, at present time no need to do surgery. Gave book about exercises, tips about dos and don'ts. He is going to give me a call and let me know how he is doing. "     01/12/2016: Today he reports that he occasionally gets a little bit of achy discomfort across his low back. Says that "something about the angle when he stands to wash dishes"-- when he standing at that angle is when he feels some discomfort across his low back.  Today he reports that 2-3 day weeks ago  he developed pain in the left posterior neck and the pain was radiating down his left arm. Says this happened 2 or 3 weeks ago and is gradually getting better. Still feels some discomfort in the left posterior neck. No longer feeling any pain down the left arm. Feeling no tingling or numbness or weakness in the left arm or grip strength.  Has no other complaints or concerns today.  Otherwise says he has been feeling well. States that he is taking his blood pressure medications as directed. No adverse effects.  Says that when he recently went to give blood that at that time he was told his blood pressure was 130/82.  I told him that his blood pressure is reading high today. He says that he has not taken his dose for today yet but otherwise has been taking it daily as directed.    Past Medical History:  Diagnosis Date  . Colon polyps   . COPD (chronic obstructive pulmonary disease) ([place])   . Echocardiogram abnormal   . Ganglion cyst   . Hematuria   . Hyperlipidemia   . Hypertension   . Systolic murmur      Home Meds: Outpatient Medications Prior to Visit  Medication Sig Dispense Refill  . aspirin 325 MG tablet Take 325 mg by mouth daily.      . benazepril-hydrochlorthiazide (LOTENSIN HCT) 20-25 MG tablet TAKE 1 TABLET BY MOUTH DAILY 90 tablet 1  . Cholecalciferol (VITAMIN D) 2000 UNITS CAPS Take 1 capsule (2,000 Units total) by mouth daily. 30 capsule 11  . diltiazem (CARDIZEM) 90 MG tablet TAKE 1 TABLET BY MOUTH THREE TIMES DAILY 90 tablet 3  . finasteride (PROSCAR) 5 MG tablet TK 1 T PO QD  11  . terazosin (HYTRIN) 10 MG capsule TK 1 C PO QD HS  6   No facility-administered medications prior to visit.     Allergies:  Allergies  Allergen Reactions  . Amlodipine Besy-Benazepril Hcl     Jittery, gets confused.  . Lipitor [Atorvastatin Calcium]     Weakness.  . Zocor [Simvastatin]   . Bactrim [Sulfamethoxazole-Trimethoprim] Rash  . Pravachol Hives and Rash    Social History   Social History  . Marital status: Widowed    Spouse name: N/A  . Number of children: N/A  . Years of education: N/A   Occupational History  . Not on file.   Social History Main Topics  . Smoking status: Former Smoker    Quit date: 10/18/1981  . Smokeless tobacco: Never Used  . Alcohol use No  . Drug use: No  . Sexual activity: Not on file   Other Topics Concern  .  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   No other exercise   Plays guitar.   Separated. Lives alone.    Family History  Problem Relation Age of Onset  . Diabetes Mother   . Stroke Brother      Review of Systems:  See HPI for pertinent ROS. All other ROS negative.    Physical Exam: Blood pressure (!) 150/78, pulse 84, temperature 97.9 F (36.6 C), temperature source Oral, resp. rate 16, weight 196 lb (88.9 kg), SpO2 98 %., Body mass index is 31.64 kg/m. General: WNWD AAM. Very Pleasant. Appears in no acute distress. Neck: Supple. No thyromegaly. No lymphadenopathy. No carotid bruits. Lungs: Clear bilaterally to auscultation without wheezes, rales, or rhonchi. Breathing is unlabored. Heart: RRR  with S1 S2. I/VI murmur Abdomen: Soft, non-tender, non-distended with normoactive bowel sounds. No hepatomegaly. No rebound/guarding. No obvious abdominal masses. Musculoskeletal:  Strength and tone normal for age. Extremities/Skin: Warm and dry.No LE edema. Neuro: Alert and oriented X 3. Moves all extremities spontaneously. Gait is normal. CNII-XII grossly in tact. Psych:  Responds to questions appropriately with a normal affect.     ASSESSMENT AND PLAN:  82 y.o. year old male with  1. Essential hypertension 01/12/2016: Recheck blood pressure myself and am getting 170/90 in the left and right arm as well. I'm going to go ahead and add Bystolic 5 mg daily. Gave him 2 sample bottles for total of 14 tablets of samples.         Told him to go ahead and check with the pharmacy to find out what his cost will be so that we will know whether he is going to be able to afford this for him to stay on this future. Will schedule           follow-up office visit with me in 2 weeks.         Also continue current blood pressure medicines and add the Bystolic in addition.       - BASIC METABOLIC PANEL WITH GFR        - nebivolol (BYSTOLIC) 5 MG  tablet; Take 1 tablet (5 mg total) by mouth daily.  Dispense: 30 tablet; Refill: 5  Chronic bilateral low back pain without sciatica 01/12/2016: Will add Flexeril and see if this helps both low back pain and left neck pain.   - cyclobenzaprine (FLEXERIL) 10 MG tablet; Take 1 tablet (10 mg total) by mouth 3 (three) times daily as needed for muscle spasms.  Dispense: 30 tablet; Refill: 0 AT NEXT VISIT WILL DISCUSS ADDING NEURONTIN TO HELP WITH SYMPTOMS IN HIS LEG SECONDARY TO THIS  Neck pain on left side 01/12/2016: Will add Flexeril and see if this helps both low back pain and left neck pain.   IF SYMPTOMS DO NOT IMPROVE OR IF DEVELOPS SYMPTOMS DOWN ARM, WILL OBTAIN XRAY  - cyclobenzaprine (FLEXERIL) 10 MG tablet; Take 1 tablet (10 mg total) by mouth 3 (three) times daily as needed for muscle spasms.  Dispense: 30 tablet; Refill: 0   Hyperlipidemia He has a history of intolerance to Zocor, Lipitor, Pravachol. In the past he tolerated Crestor but quit this secondary to cost. He is been off of medication since April 2009. We had rechecked his lipids since been off of medication --checked 09/2012--off medication--LDL was 122. numbers were good so we decided he was fine to stay off of medication.  He rechecked FLP at [facility] 12/2013--LDL-113.  01/12/2016: Reviwed this info today--stay off lipid medication  Vitamin D Deficiency:  At labs 12/2013 recommended to take vitamin D 2000 IUs daily. Patient states that he is taking this at this dose. Recheck lab 12/2014--Vit D level WNL---to cont same dose---can wait to recheck lab level 01/12/2016: Reviewed above. Cont current dose Vit D  Colon polyps Last colonoscopy was June 2012. To repeat 3 years which will be June 2015. I wrote on his AVS at [place] 05/31/13 to call Dr.Buccini/[facility] to schedule.  Pt says he had colonoscopy performed--2015--not sure if supposed to repeat 5 years or 10 years. I do not see report scanned in Epic  Systolic  murmur Last echo was June 2011. See below.  At [place] 01/06/2015--Exam--soft murmur--does not need f/u Echo now 12/2015: still with soft murmur--has  not worsened  Echocardiogram abnormal Echo June 2011 showed normal LV function. Mild aortic sclerosis. Trace MR. Trace TR. At [place] 01/06/2015--Exam--soft murmur--does not need f/u Echo now   BPH/hematuria  --He sees urology routinely for these 01/12/2016--Cont f/u with Urology  Immunizations A Pneumovax--- November 2012.  Prevnar 13--given here 05/31/2013 B Tetanus--- May 2012 C Zostavax: I discussed this again with him today. He said he did check with his insurance company but it was going to cost significant cost to him he is deferring. D influenza vaccine: He received --12/2013, 09/2014    F/U OV 2 weeks.   Signed, [address] [place], [place], [facility] 01/12/2016 8:55 AM

## 2016-01-22 ENCOUNTER — Ambulatory Visit: Payer: Self-pay | Admitting: Physician Assistant

## 2016-04-14 ENCOUNTER — Other Ambulatory Visit: Payer: Self-pay

## 2016-04-14 MED ORDER — DILTIAZEM HCL 90 MG PO TABS: 90.0000 mg | ORAL_TABLET | Freq: Three times a day (TID) | ORAL | 3 refills | Status: DC

## 2016-04-14 MED ORDER — BENAZEPRIL-HYDROCHLOROTHIAZIDE 20-25 MG PO TABS: 1.0000 | ORAL_TABLET | Freq: Every day | ORAL | 1 refills | Status: DC

## 2016-04-14 NOTE — Telephone Encounter (Signed)
Rx refill

## 2016-04-16 ENCOUNTER — Other Ambulatory Visit: Payer: Self-pay

## 2016-04-29 ENCOUNTER — Encounter: Payer: Self-pay | Admitting: Physician Assistant

## 2016-04-29 ENCOUNTER — Ambulatory Visit (INDEPENDENT_AMBULATORY_CARE_PROVIDER_SITE_OTHER): Payer: Medicare Other | Admitting: Physician Assistant

## 2016-04-29 VITALS — BP 130/70 | HR 70 | Temp 98.0°F | Resp 16 | Wt 198.2 lb

## 2016-04-29 DIAGNOSIS — I10 Essential (primary) hypertension: Secondary | ICD-10-CM | POA: Diagnosis not present

## 2016-04-29 NOTE — Progress Notes (Signed)
Patient ID: STRIDER VALLANCE MRN: [id number], DOB: July 09, 1933, 83 y.o. Date of Encounter: @DATE @  Chief Complaint:  Chief Complaint  Patient presents with  . Hypertension    follow up  . left wrist pain, tingling in fingers    HPI: 83 y.o. year old male  presents for routine f/u OV.   Year after year he has told me that he raises quite a garden. Corn, squash, cucumbers, tomatoes, green beans etc.  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  List the Names of Other Physician/Practitioners you currently use:  Sees urology once a year.  [place] ever 1 -2 years for screening. No glaucoma.  No other providers now.   AT OV 04/2015 he was having back pain, radicular symptoms. Obtained Imaging.   ADDENDUM ADDED 05/31/2015: Received OV note from Dr. Joya Salm at [facility]--OV note dated 05/12/15.  "Clinically, pt is doing really well, except for sensory changes. Information given for him to read. Told him that unless symptoms get worse, at present time no need to do surgery. Gave book about exercises, tips about dos and don'ts. He is going to give me a call and let me know how he is doing. "     01/12/2016: Today he reports that he occasionally gets a little bit of achy discomfort across his low back. Says that "something about the angle when he stands to wash dishes"-- when he standing at that angle is when he feels some discomfort across his low back.  Today he reports that 2-3 day weeks ago he developed pain in the left posterior neck and the pain was radiating down his left arm. Says this happened 2 or 3 weeks ago and is gradually getting better. Still feels some discomfort in the left posterior neck. No longer feeling any pain down the left arm. Feeling  no tingling or numbness or weakness in the left arm or grip strength.  Has no other complaints or concerns today. Otherwise says he has been feeling well. States that he is taking his blood pressure medications as directed. No adverse effects.  Says that when he recently went to give blood that at that time he was told his blood pressure was 130/82.  I told him that his blood pressure is reading high today. He says that he has not taken his dose for today yet but otherwise has been taking it daily as directed. At that OV--I added Bystolic  04/02/1015: Today he reports that after he added the Bystolic he experienced some cramping discomfort. Thought it might be secondary to the Bystolic so he stopped Bystolic. Cramping resolved. Says his BP has been just fine even off of the Bystolic. He checks his blood pressure at home he also had it checked at the [employer] physical and it has been reading get all those checks.  Today he also states that he has occasionally felt some discomfort in his left wrist and tingling in his fingers. Has felt this off and on intermittently but noticed it more yesterday. I noted that he plays the guitar. He says that he does not feel the symptoms while he is playing the guitar and he does not feel the symptoms at night.   Past Medical History:  Diagnosis Date  .  Colon polyps   . COPD (chronic obstructive pulmonary disease) ([place])   . Echocardiogram abnormal   . Ganglion cyst   . Hematuria   . Hyperlipidemia   . Hypertension   . Systolic murmur      Home Meds: Outpatient Medications Prior to Visit  Medication Sig Dispense Refill  . aspirin 325 MG tablet Take 325 mg by mouth daily.      . benazepril-hydrochlorthiazide (LOTENSIN HCT) 20-25 MG tablet Take 1 tablet by mouth daily. 90 tablet 1  . Cholecalciferol (VITAMIN D) 2000 UNITS CAPS Take 1 capsule (2,000 Units total) by mouth daily. 30 capsule 11  . diltiazem (CARDIZEM) 90 MG tablet Take 1 tablet (90 mg total) by  mouth 3 (three) times daily. 90 tablet 3  . finasteride (PROSCAR) 5 MG tablet TK 1 T PO QD  11  . terazosin (HYTRIN) 10 MG capsule TK 1 C PO QD HS  6  . nebivolol (BYSTOLIC) 5 MG tablet Take 1 tablet (5 mg total) by mouth daily. 30 tablet 5  . cyclobenzaprine (FLEXERIL) 10 MG tablet Take 1 tablet (10 mg total) by mouth 3 (three) times daily as needed for muscle spasms. 30 tablet 0   No facility-administered medications prior to visit.     Allergies:  Allergies  Allergen Reactions  . Amlodipine Besy-Benazepril Hcl     Jittery, gets confused.  . Lipitor [Atorvastatin Calcium]     Weakness.  . Zocor [Simvastatin]   . Bactrim [Sulfamethoxazole-Trimethoprim] Rash  . Pravachol Hives and Rash    Social History   Social History  . Marital status: Widowed    Spouse name: N/A  . Number of children: N/A  . Years of education: N/A   Occupational History  . Not on file.   Social History Main Topics  . Smoking status: Former Smoker    Quit date: 10/18/1981  . Smokeless tobacco: Never Used  . Alcohol use No  . Drug use: No  . Sexual activity: Not on file   Other Topics Concern  . 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   No other exercise   Plays guitar.   Separated. Lives alone.    Family History  Problem Relation Age of Onset  . Diabetes Mother   . Stroke Brother      Review of Systems:  See HPI for pertinent ROS. All other ROS negative.    Physical Exam: Blood pressure 130/70, pulse 70, temperature 98 F (36.7 C), temperature source Oral, resp. rate 16, weight 198 lb 3.2 oz (89.9 kg), SpO2 98 %., Body mass index is 31.99 kg/m. General: WNWD AAM. Very Pleasant. Appears in no acute distress. Neck: Supple. No thyromegaly. No lymphadenopathy. No carotid bruits. Lungs: Clear bilaterally to auscultation without wheezes, rales, or rhonchi. Breathing is unlabored. Heart: RRR with S1 S2. I/VI  murmur Musculoskeletal:  Strength and tone normal for age. Tinels: Negative Phalens: Negative Extremities/Skin: Warm and dry.No LE edema. Neuro: Alert and oriented X 3. Moves all extremities spontaneously. Gait is normal. CNII-XII grossly in tact. Psych:  Responds to questions appropriately with a normal affect.     ASSESSMENT AND PLAN:  83 y.o. year old male with  1. Essential hypertension 04/29/2016: BP is at goal. Cont currnet meds. No labs due at this time  04/29/2016: Discussed that his symptoms of discomfort in the left wrist and the tingling in the fingers is consistent with carpal tunnel or  similar condition. However Tinel's and Phalen's are negative today. That the symptoms have been very minimal and does not want to use a wrist splint at this time. He will follow-up if the symptoms worsen.   --------------------------------THE FOLLOWING IS NOT ADDRESSED AT OV 04/29/2016------------------------------------------------ Chronic bilateral low back pain without sciatica 01/12/2016: Will add Flexeril and see if this helps both low back pain and left neck pain.   - cyclobenzaprine (FLEXERIL) 10 MG tablet; Take 1 tablet (10 mg total) by mouth 3 (three) times daily as needed for muscle spasms.  Dispense: 30 tablet; Refill: 0 AT NEXT VISIT WILL DISCUSS ADDING NEURONTIN TO HELP WITH SYMPTOMS IN HIS LEG SECONDARY TO THIS  Neck pain on left side 01/12/2016: Will add Flexeril and see if this helps both low back pain and left neck pain.   IF SYMPTOMS DO NOT IMPROVE OR IF DEVELOPS SYMPTOMS DOWN ARM, WILL OBTAIN XRAY  - cyclobenzaprine (FLEXERIL) 10 MG tablet; Take 1 tablet (10 mg total) by mouth 3 (three) times daily as needed for muscle spasms.  Dispense: 30 tablet; Refill: 0   Hyperlipidemia He has a history of intolerance to Zocor, Lipitor, Pravachol. In the past he tolerated Crestor but quit this secondary to cost. He is been off of medication since April 2009. We had rechecked his lipids  since been off of medication --checked 09/2012--off medication--LDL was 122. numbers were good so we decided he was fine to stay off of medication.  He rechecked FLP at [facility] 12/2013--LDL-113.  01/12/2016: Reviwed this info today--stay off lipid medication  Vitamin D Deficiency:  At labs 12/2013 recommended to take vitamin D 2000 IUs daily. Patient states that he is taking this at this dose. Recheck lab 12/2014--Vit D level WNL---to cont same dose---can wait to recheck lab level 01/12/2016: Reviewed above. Cont current dose Vit D  Colon polyps Last colonoscopy was June 2012. To repeat 3 years which will be June 2015. I wrote on his AVS at [place] 05/31/13 to call Dr.Buccini/[facility] to schedule.  Pt says he had colonoscopy performed--2015--not sure if supposed to repeat 5 years or 10 years. I do not see report scanned in Epic  Systolic murmur Last echo was June 2011. See below.  At [place] 01/06/2015--Exam--soft murmur--does not need f/u Echo now 12/2015: still with soft murmur--has not worsened  Echocardiogram abnormal Echo June 2011 showed normal LV function. Mild aortic sclerosis. Trace MR. Trace TR. At [place] 01/06/2015--Exam--soft murmur--does not need f/u Echo now   BPH/hematuria  --He sees urology routinely for these 01/12/2016--Cont f/u with Urology  Immunizations A Pneumovax--- November 2012.  Prevnar 13--given here 05/31/2013 B Tetanus--- May 2012 C Zostavax: I discussed this again with him today. He said he did check with his insurance company but it was going to cost significant cost to him he is deferring. D influenza vaccine: He received --12/2013, 09/2014    04/29/2016:   F/U OV 6 months, sooner if needed.   [address] [place], [place], [facility] 04/29/2016 2:52 PM

## 2016-07-02 ENCOUNTER — Other Ambulatory Visit: Payer: Self-pay | Admitting: Physician Assistant

## 2016-07-02 NOTE — Telephone Encounter (Signed)
Refill appropriate 

## 2016-07-20 ENCOUNTER — Other Ambulatory Visit: Payer: Self-pay

## 2016-07-20 ENCOUNTER — Other Ambulatory Visit: Payer: Self-pay | Admitting: Physician Assistant

## 2016-07-20 MED ORDER — BENAZEPRIL-HYDROCHLOROTHIAZIDE 20-25 MG PO TABS: 1.0000 | ORAL_TABLET | Freq: Every day | ORAL | 1 refills | Status: DC

## 2016-07-20 MED ORDER — DILTIAZEM HCL 90 MG PO TABS: 90.0000 mg | ORAL_TABLET | Freq: Three times a day (TID) | ORAL | 3 refills | Status: DC

## 2016-07-20 NOTE — Telephone Encounter (Signed)
Refill appropriate 

## 2016-07-20 NOTE — Telephone Encounter (Signed)
Pt requested his medications be sent through mail order. Order processed pt is aware

## 2016-07-27 ENCOUNTER — Other Ambulatory Visit: Payer: Self-pay

## 2016-10-11 DIAGNOSIS — N400 Enlarged prostate without lower urinary tract symptoms: Secondary | ICD-10-CM | POA: Diagnosis not present

## 2016-10-11 DIAGNOSIS — R310 Gross hematuria: Secondary | ICD-10-CM | POA: Diagnosis not present

## 2016-11-01 ENCOUNTER — Ambulatory Visit (INDEPENDENT_AMBULATORY_CARE_PROVIDER_SITE_OTHER): Payer: Medicare Other | Admitting: Physician Assistant

## 2016-11-01 ENCOUNTER — Encounter: Payer: Self-pay | Admitting: Physician Assistant

## 2016-11-01 VITALS — BP 144/78 | HR 74 | Temp 98.3°F | Resp 14 | Ht 66.0 in | Wt 203.0 lb

## 2016-11-01 DIAGNOSIS — E785 Hyperlipidemia, unspecified: Secondary | ICD-10-CM

## 2016-11-01 DIAGNOSIS — Z23 Encounter for immunization: Secondary | ICD-10-CM | POA: Diagnosis not present

## 2016-11-01 DIAGNOSIS — I10 Essential (primary) hypertension: Secondary | ICD-10-CM | POA: Diagnosis not present

## 2016-11-01 DIAGNOSIS — R011 Cardiac murmur, unspecified: Secondary | ICD-10-CM

## 2016-11-01 DIAGNOSIS — J439 Emphysema, unspecified: Secondary | ICD-10-CM

## 2016-11-01 DIAGNOSIS — E559 Vitamin D deficiency, unspecified: Secondary | ICD-10-CM | POA: Diagnosis not present

## 2016-11-01 NOTE — Progress Notes (Signed)
Patient ID: Patrick Villa MRN: [id number], DOB: 04-19-1933, 83 y.o. Date of Encounter: @DATE @  Chief Complaint:  Chief Complaint  Patient presents with  . Follow-up    is not fasting    HPI: 83 y.o. year old male  presents for routine f/u OV.   Year after year he has told me that he raises quite a garden. Corn, squash, cucumbers, tomatoes, green beans etc.  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  List the Names of Other Physician/Practitioners you currently use:  Sees urology once a year.  [place] ever 1 -2 years for screening. No glaucoma.  No other providers now.   AT OV 04/2015 he was having back pain, radicular symptoms. Obtained Imaging.   ADDENDUM ADDED 05/31/2015: Received OV note from Dr. Joya Salm at [facility]--OV note dated 05/12/15.  "Clinically, pt is doing really well, except for sensory changes. Information given for him to read. Told him that unless symptoms get worse, at present time no need to do surgery. Gave book about exercises, tips about dos and don'ts. He is going to give me a call and let me know how he is doing. "     01/12/2016: Today he reports that he occasionally gets a little bit of achy discomfort across his low back. Says that "something about the angle when he stands to wash dishes"-- when he standing at that angle is when he feels some discomfort across his low back.  Today he reports that 2-3 day weeks ago he developed pain in the left posterior neck and the pain was radiating down his left arm. Says this happened 2 or 3 weeks ago and is gradually getting better. Still feels some discomfort in the left posterior neck. No longer feeling any pain down the left arm. Feeling no tingling or numbness or weakness in  the left arm or grip strength.  Has no other complaints or concerns today. Otherwise says he has been feeling well. States that he is taking his blood pressure medications as directed. No adverse effects.  Says that when he recently went to give blood that at that time he was told his blood pressure was 130/82.  I told him that his blood pressure is reading high today. He says that he has not taken his dose for today yet but otherwise has been taking it daily as directed. At that OV--I added Bystolic  0/02/7251: Today he reports that after he added the Bystolic he experienced some cramping discomfort. Thought it might be secondary to the Bystolic so he stopped Bystolic. Cramping resolved. Says his BP has been just fine even off of the Bystolic. He checks his blood pressure at home he also had it checked at the [employer] physical and it has been reading get all those checks.  Today he also states that he has occasionally felt some discomfort in his left wrist and tingling in his fingers. Has felt this off and on intermittently but noticed it more yesterday. I noted that he plays the guitar. He says that he does not feel the symptoms while he is playing the guitar and he does not feel the symptoms at night.    11/01/2016: Today he reports that he has been feeling good. Today he  actually apologizes that his shirt is wet with sweat. Says that he was out working. Had been grinding up a stump into mulch and then has been using pitchfork to spread that mulch. Says he suddenly realized the time and had to rush to get here. He says that even with all of that exertion he has been feeling good and is having no angina symptoms.  I discussed that his blood pressure reading is slightly high today. He states that he checked it at home last night and got 131/68.  He has no concerns to address today. Says that he has been feeling good. He has been having no shortness of breath or dyspnea on exertion. Has had no chest  pressure, chest heaviness, chest tightness.  He is taking medications as directed with no adverse effects. No lightheadedness.  He says he did get to go to [place] recently. Says that he had a friend who got married and then they went there to [place] and they invited him and his wife to go with them. Says he actually spent his birthday there.  Says that he is still playing guitar at multiple churches. States that he has not been having any pain in his wrist anymore. That has resolved.    Past Medical History:  Diagnosis Date  . Colon polyps   . COPD (chronic obstructive pulmonary disease) ([place])   . Echocardiogram abnormal   . Ganglion cyst   . Hematuria   . Hyperlipidemia   . Hypertension   . Systolic murmur      Home Meds: Outpatient Medications Prior to Visit  Medication Sig Dispense Refill  . aspirin 325 MG tablet Take 325 mg by mouth daily.      . benazepril-hydrochlorthiazide (LOTENSIN HCT) 20-25 MG tablet Take 1 tablet by mouth daily. 90 tablet 1  . Cholecalciferol (VITAMIN D) 2000 UNITS CAPS Take 1 capsule (2,000 Units total) by mouth daily. 30 capsule 11  . diltiazem (CARDIZEM) 90 MG tablet Take 1 tablet (90 mg total) by mouth 3 (three) times daily. 90 tablet 3  . finasteride (PROSCAR) 5 MG tablet TK 1 T PO QD  11  . terazosin (HYTRIN) 10 MG capsule TK 1 C PO QD HS  6   No facility-administered medications prior to visit.     Allergies:  Allergies  Allergen Reactions  . Amlodipine Besy-Benazepril Hcl     Jittery, gets confused.  . Lipitor [Atorvastatin Calcium]     Weakness.  . Zocor [Simvastatin]   . Bactrim [Sulfamethoxazole-Trimethoprim] Rash  . Pravachol Hives and Rash    Social History   Social History  . Marital status: Widowed    Spouse name: N/A  . Number of children: N/A  . Years of education: N/A   Occupational History  . Not on file.   Social History Main Topics  . Smoking status: Former Smoker    Quit date:  10/18/1981  . Smokeless tobacco: Never Used  . Alcohol use No  . Drug use: No  . Sexual activity: Not on file   Other Topics Concern  . 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   No other exercise   Plays guitar.   Separated. Lives alone.    Family History  Problem Relation Age of Onset  . Diabetes Mother   . Stroke Brother      Review of Systems:  See HPI for pertinent ROS. All other  ROS negative.    Physical Exam: Blood pressure (!) 144/78, pulse 74, temperature 98.3 F (36.8 C), temperature source Oral, resp. rate 14, height 5\' 6"  (1.676 m), weight 92.1 kg (203 lb), SpO2 99 %., Body mass index is 32.77 kg/m. General: WNWD AAM. Very Pleasant. Appears in no acute distress. Neck: Supple. No thyromegaly. No lymphadenopathy. No carotid bruits (he does have radiation of murmur to right carotid but c/w this, not carotid bruit). Lungs: Clear bilaterally to auscultation without wheezes, rales, or rhonchi. Breathing is unlabored. Heart: RRR with S1 S2. IV/VI murmur at 2nd ICS on Right. Musculoskeletal:  Strength and tone normal for age. Extremities/Skin: Warm and dry.No LE edema. Neuro: Alert and oriented X 3. Moves all extremities spontaneously. Gait is normal. CNII-XII grossly in tact. Psych:  Responds to questions appropriately with a normal affect.     ASSESSMENT AND PLAN:  83 y.o. year old male with   1. Essential hypertension 11/01/2016: Blood Pressure slightly elevated at today's visit that he checked it at home last night and got 131/68. Continue current medications without change. Check labs to monitor. - BASIC METABOLIC PANEL WITH GFR  Hyperlipidemia He has a history of intolerance to Zocor, Lipitor, Pravachol. In the past he tolerated Crestor but quit this secondary to cost. He is been off of medication since April 2009. We had rechecked his lipids since been off of medication --checked 09/2012--off  medication--LDL was 122. numbers were good so we decided he was fine to stay off of medication.  He rechecked FLP at [facility] 12/2013--LDL-113.  01/12/2016: Reviwed this info today--stay off lipid medication 11/01/2016: He can stay off lipid medication. He is not fasting today so we'll wait to monitor and future.  Systolic murmur His murmur is louder today. Will get another echo to follow up/reassess.  Last echo was June 2011.Echo June 2011 showed normal LV function. Mild aortic sclerosis. Trace MR. Trace TR. - ECHOCARDIOGRAM COMPLETE; Future   Vitamin D Deficiency:  At labs 12/2013 recommended to take vitamin D 2000 IUs daily. Patient states that he is taking this at this dose. Recheck lab 12/2014--Vit D level WNL---to cont same dose---can wait to recheck lab level 01/12/2016: Reviewed above. Cont current dose Vit D  Colon polyps Last colonoscopy was June 2012. To repeat 3 years which will be June 2015. I wrote on his AVS at [place] 05/31/13 to call Dr.Buccini/[facility] to schedule.  Pt says he had colonoscopy performed--2015--not sure if supposed to repeat 5 years or 10 years. I do not see report scanned in Epic   BPH/hematuria  --He sees urology routinely for these 01/12/2016--Cont f/u with Urology  Immunizations A Pneumovax--- November 2012.  Prevnar 13--given here 05/31/2013 B Tetanus--- May 2012 C Zostavax: I discussed this again with him today. He said he did check with his insurance company but it was going to cost significant cost to him he is deferring. D influenza vaccine: He received --12/2013, 09/2014  11/01/2016---Give high-dose Flu Vaccine today--he is agreeable. Encounter for immunization - Flu vaccine HIGH DOSE PF    11/01/2016:    Will obtain echocardiogram to follow-up evaluation of his murmur.  Will follow-up with him once I get that report.  Otherwise will plan for routine follow-up in 6 months. Sooner if needed. F/U OV 6 months, sooner if needed.    Signed, [address] [place], [place], [facility] 11/01/2016 2:05 PM

## 2016-11-02 ENCOUNTER — Encounter: Payer: Self-pay | Admitting: *Deleted

## 2016-11-02 LAB — BASIC METABOLIC PANEL WITH GFR
BUN / CREAT RATIO: 12 (calc) (ref 6–22)
BUN: 15 mg/dL (ref 7–25)
CHLORIDE: 102 mmol/L (ref 98–110)
CO2: 28 mmol/L (ref 20–32)
Calcium: 10.3 mg/dL (ref 8.6–10.3)
Creat: 1.26 mg/dL — ABNORMAL HIGH (ref 0.70–1.11)
GFR, EST AFRICAN AMERICAN: 62 mL/min/{1.73_m2} (ref >=60)
GFR, Est Non African American: 53 mL/min/{1.73_m2} — ABNORMAL LOW (ref >=60)
GLUCOSE: 108 mg/dL — AB (ref 65–99)
POTASSIUM: 3.6 mmol/L (ref 3.5–5.3)
Sodium: 142 mmol/L (ref 135–146)

## 2016-11-08 ENCOUNTER — Ambulatory Visit (HOSPITAL_COMMUNITY): Payer: Medicare Other | Attending: Cardiovascular Disease

## 2016-11-08 ENCOUNTER — Other Ambulatory Visit: Payer: Self-pay

## 2016-11-08 DIAGNOSIS — I119 Hypertensive heart disease without heart failure: Secondary | ICD-10-CM | POA: Insufficient documentation

## 2016-11-08 DIAGNOSIS — R011 Cardiac murmur, unspecified: Secondary | ICD-10-CM

## 2016-11-08 DIAGNOSIS — J449 Chronic obstructive pulmonary disease, unspecified: Secondary | ICD-10-CM | POA: Insufficient documentation

## 2016-11-08 DIAGNOSIS — E785 Hyperlipidemia, unspecified: Secondary | ICD-10-CM | POA: Diagnosis not present

## 2016-11-16 ENCOUNTER — Other Ambulatory Visit: Payer: Self-pay | Admitting: Physician Assistant

## 2016-11-25 ENCOUNTER — Other Ambulatory Visit: Payer: Self-pay

## 2016-11-25 ENCOUNTER — Encounter: Payer: Self-pay | Admitting: Physician Assistant

## 2016-11-25 ENCOUNTER — Ambulatory Visit (INDEPENDENT_AMBULATORY_CARE_PROVIDER_SITE_OTHER): Payer: Medicare Other | Admitting: Physician Assistant

## 2016-11-25 VITALS — BP 140/82 | HR 75 | Temp 98.2°F | Resp 14 | Ht 66.0 in | Wt 202.6 lb

## 2016-11-25 DIAGNOSIS — K4090 Unilateral inguinal hernia, without obstruction or gangrene, not specified as recurrent: Secondary | ICD-10-CM

## 2016-11-25 MED ORDER — DILTIAZEM HCL 90 MG PO TABS: 90.0000 mg | ORAL_TABLET | Freq: Three times a day (TID) | ORAL | 3 refills | Status: DC

## 2016-11-25 MED ORDER — BENAZEPRIL-HYDROCHLOROTHIAZIDE 20-25 MG PO TABS: 1.0000 | ORAL_TABLET | Freq: Every day | ORAL | 2 refills | Status: DC

## 2016-11-25 MED ORDER — FINASTERIDE 5 MG PO TABS: ORAL_TABLET | ORAL | 11 refills | Status: DC

## 2016-11-25 MED ORDER — TERAZOSIN HCL 10 MG PO CAPS: ORAL_CAPSULE | ORAL | 6 refills | Status: DC

## 2016-11-25 NOTE — Progress Notes (Signed)
    Patient ID: Patrick Villa MRN: [id number], DOB: 1933/08/15, 83 y.o. Date of Encounter: 11/25/2016, 4:11 PM    Chief Complaint:  Chief Complaint  Patient presents with  . groin area pain     HPI: 83 y.o. year old male presents with above.   Reports that recently he has been noticing a "discomfort "in his right groin. States it he first noticed it one day last week when in the shower.  States that when he rubbed over that one area he felt discomfort. States that, at different times since then, he "has felt something that feels like it should not be there." Since then, when he has been in the shower and his rubbed that area, again, "has felt something that should not be there." As well, occasionally when just sitting in the chair, will feel that same sensation or feel like something has shifted out of place.  No other concerns to address today.  Home Meds:   Outpatient Medications Prior to Visit  Medication Sig Dispense Refill  . aspirin 325 MG tablet Take 325 mg by mouth daily.      . benazepril-hydrochlorthiazide (LOTENSIN HCT) 20-25 MG tablet TAKE 1 TABLET BY MOUTH  DAILY 90 tablet 0  . Cholecalciferol (VITAMIN D) 2000 UNITS CAPS Take 1 capsule (2,000 Units total) by mouth daily. 30 capsule 11  . diltiazem (CARDIZEM) 90 MG tablet Take 1 tablet (90 mg total) by mouth 3 (three) times daily. 90 tablet 3  . finasteride (PROSCAR) 5 MG tablet TK 1 T PO QD  11  . terazosin (HYTRIN) 10 MG capsule TK 1 C PO QD HS  6   No facility-administered medications prior to visit.     Allergies:  Allergies  Allergen Reactions  . Amlodipine Besy-Benazepril Hcl     Jittery, gets confused.  . Lipitor [Atorvastatin Calcium]     Weakness.  . Zocor [Simvastatin]   . Bactrim [Sulfamethoxazole-Trimethoprim] Rash  . Pravachol Hives and Rash      Review of Systems: See HPI for pertinent ROS. All other ROS negative.    Physical Exam: Blood pressure 140/82, pulse 75, temperature 98.2 F (36.8  C), temperature source Oral, resp. rate 14, height 5\' 6"  (1.676 m), weight 91.9 kg (202 lb 9.6 oz), SpO2 98 %., Body mass index is 32.7 kg/m. General:  WNWD AAM. Appears in no acute distress. Neck: Supple. No thyromegaly. No lymphadenopathy. Lungs: Clear bilaterally to auscultation without wheezes, rales, or rhonchi. Breathing is unlabored. Heart: Regular rhythm. No murmurs, rubs, or gallops. Abdomen: Soft, non-tender, non-distended with normoactive bowel sounds. No hepatomegaly. No rebound/guarding. No obvious abdominal masses. He has focal area of tenderness at site of right inguinal hernia. Small hernia with palpation. Msk:  Strength and tone normal for age. Extremities/Skin: Warm and dry.  Neuro: Alert and oriented X 3. Moves all extremities spontaneously. Gait is normal. CNII-XII grossly in tact. Psych:  Responds to questions appropriately with a normal affect.     ASSESSMENT AND PLAN:  83 y.o. year old male with  1. Right inguinal hernia Suspect Right Inguinal Hernia. Will refer to General Surgery for further evaluation, management.  - Ambulatory referral to General Surgery   Signed, Ole[place] Ree [place], [place], [facility] 11/25/2016 4:11 PM

## 2016-12-06 DIAGNOSIS — R1909 Other intra-abdominal and pelvic swelling, mass and lump: Secondary | ICD-10-CM | POA: Diagnosis not present

## 2017-02-10 ENCOUNTER — Other Ambulatory Visit: Payer: Self-pay | Admitting: Physician Assistant

## 2017-02-10 NOTE — Telephone Encounter (Signed)
Refill appropriate 

## 2017-03-15 IMAGING — MR MR LUMBAR SPINE W/O CM
5 series · 45 of 48 positions shown · non-contrast
Comparison: Lumbar radiographs 04/28/2015

CLINICAL DATA: Right-sided sciatica.  Low back pain

EXAM:
MRI LUMBAR SPINE WITHOUT CONTRAST
TECHNIQUE: Multiplanar, multisequence MR imaging of the lumbar spine was
performed. No intravenous contrast was administered.

[Series 3: T2 · sagittal · 4.0mm · 0.88mm/px · 6 of 15 slices shown (1 of 2)]
[im 1/15]
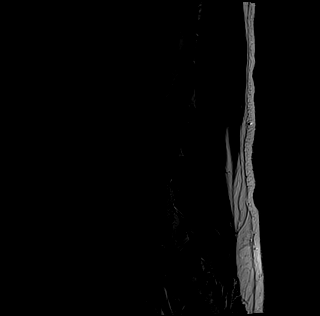
[im 3/15]
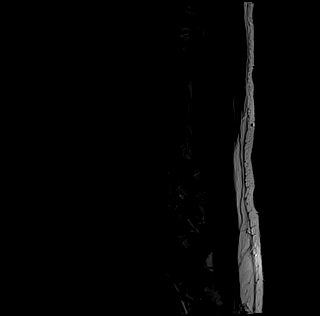
[im 6/15]
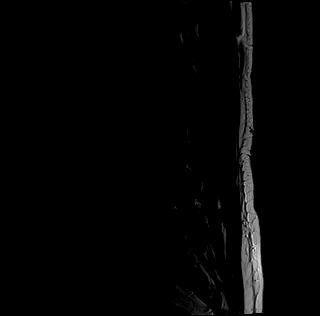
[im 9/15]
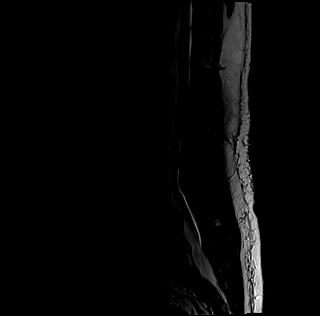
[im 12/15]
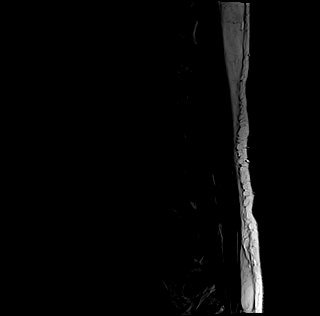
[im 15/15]
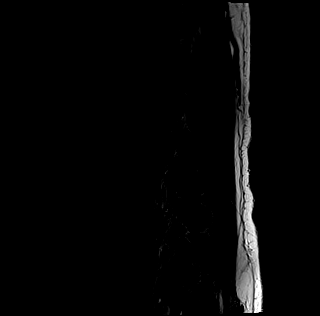

[Series 4: tirm sag · sagittal · 4.0mm · 0.55mm/px · 6 of 15 slices shown]
[im 1/15]
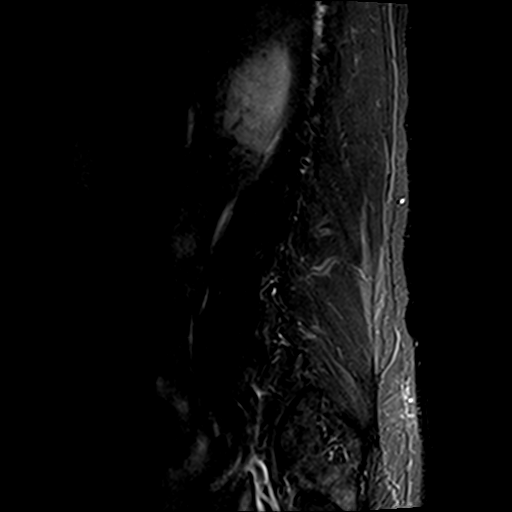
[im 3/15]
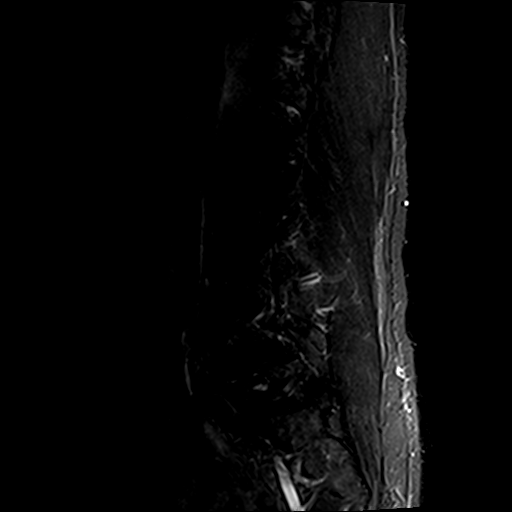
[im 6/15]
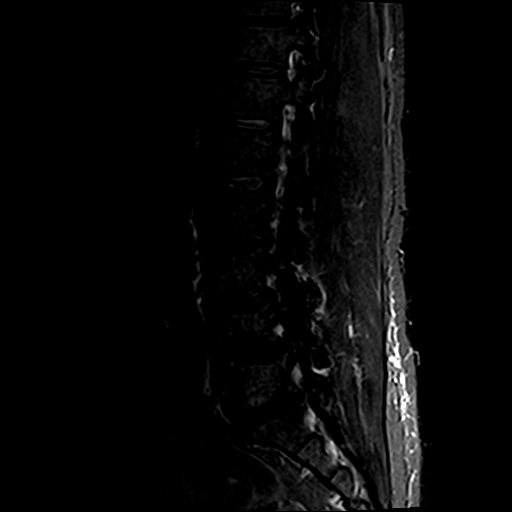
[im 9/15]
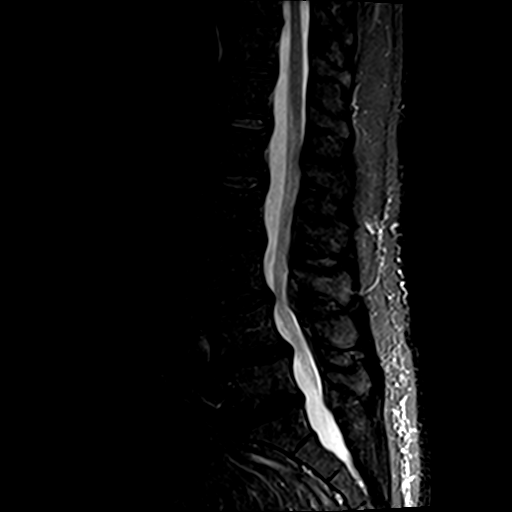
[im 12/15]
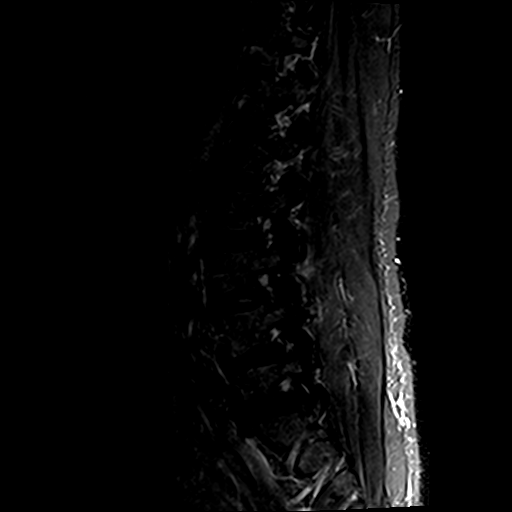
[im 15/15]
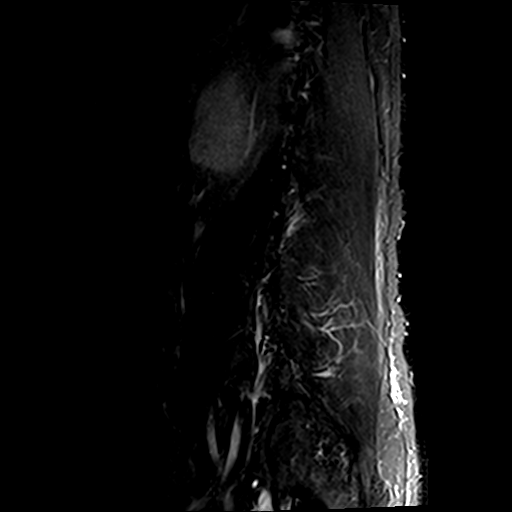

[Series 5: T1 · sagittal · 4.0mm · 0.88mm/px · 6 of 15 slices shown (1 of 2)]
[im 1/15]
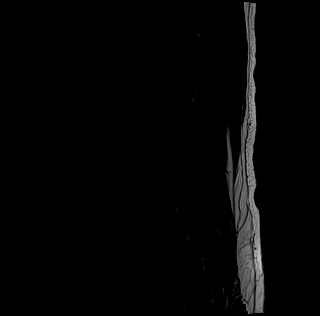
[im 3/15]
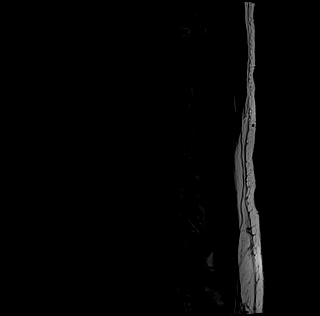
[im 6/15]
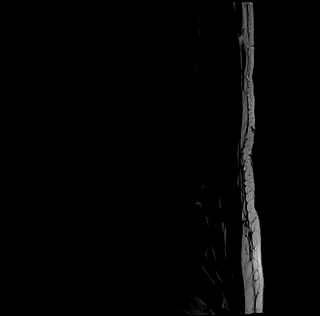
[im 9/15]
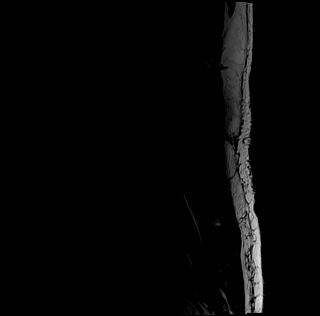
[im 12/15]
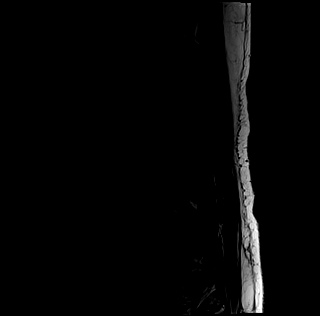
[im 15/15]
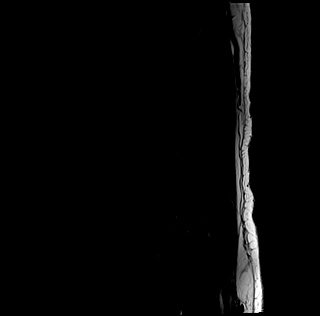

[Series 6: T1 · axial · 4.0mm · 0.70mm/px · z∈[-61,+133]mm · 12 of 37 slices shown (2 of 2)]
[im 1/37]
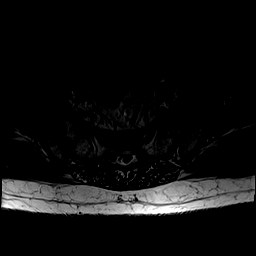
[im 3/37]
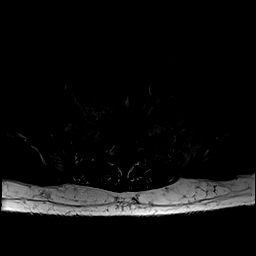
[im 6/37]
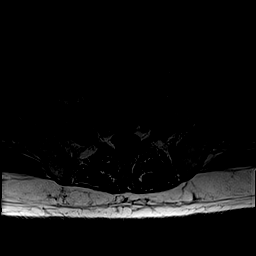
[im 8/37]
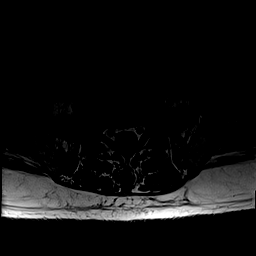
[im 11/37]
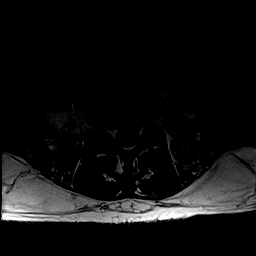
[im 13/37]
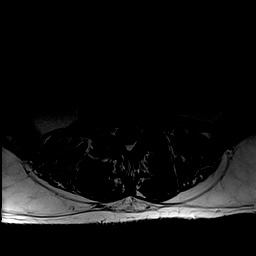
[im 16/37]
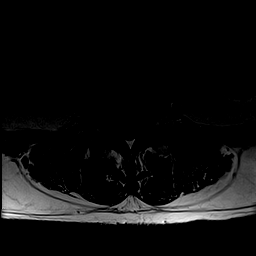
[im 19/37]
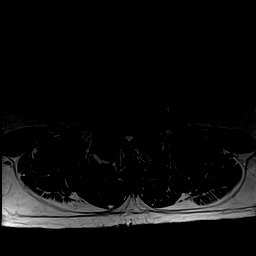
[im 21/37]
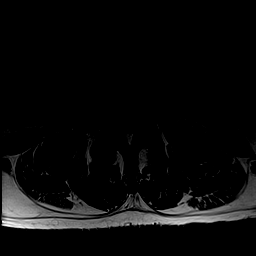
[im 26/37]
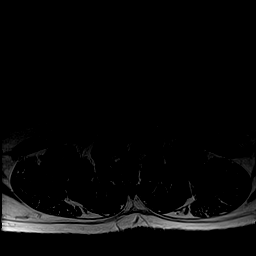
[im 31/37]
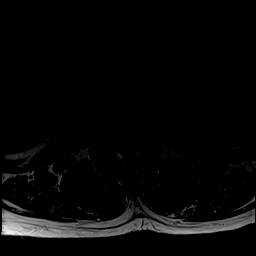
[im 37/37]
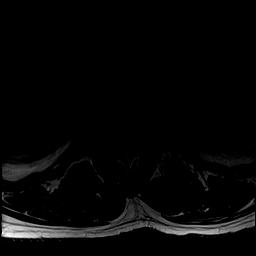

[Series 7: T2 · axial · 4.0mm · 0.70mm/px · z∈[-61,+133]mm · 15 of 37 slices shown (2 of 2)]
[im 1/37]
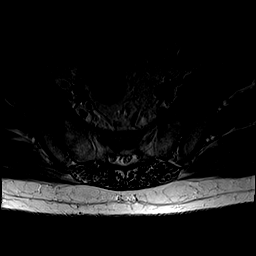
[im 3/37]
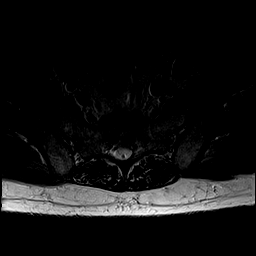
[im 6/37]
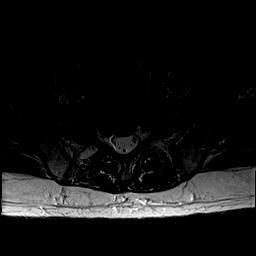
[im 8/37]
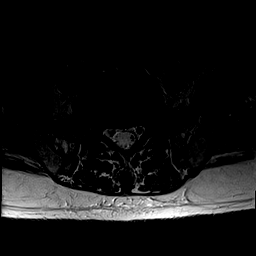
[im 11/37]
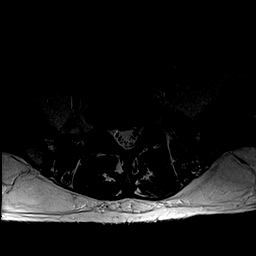
[im 13/37]
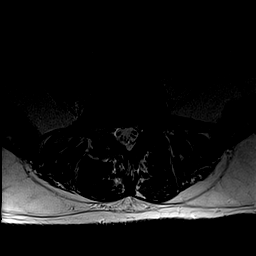
[im 16/37]
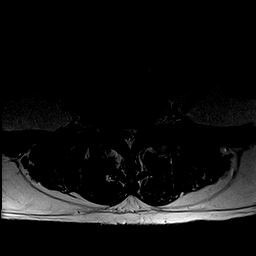
[im 19/37]
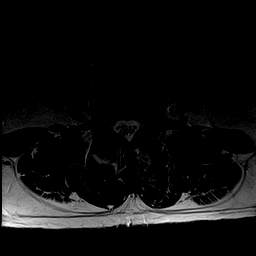
[im 21/37]
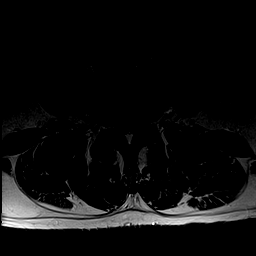
[im 24/37]
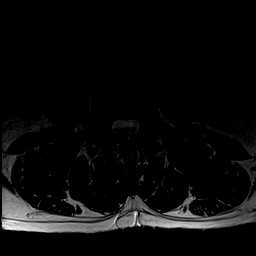
[im 26/37]
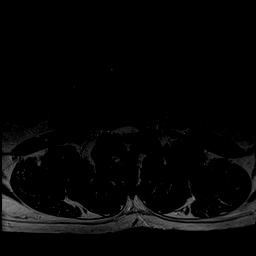
[im 29/37]
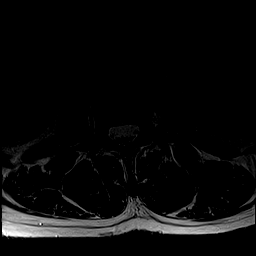
[im 31/37]
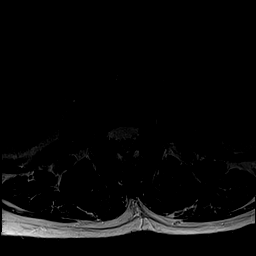
[im 34/37]
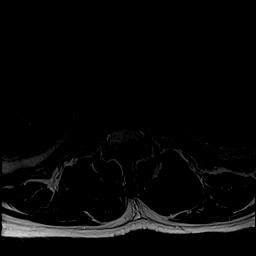
[im 37/37]
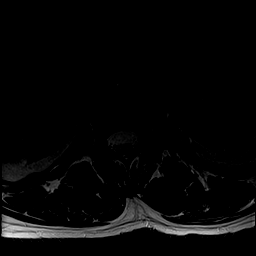

[45 of 48 positions shown; findings below may reference images not displayed]

FINDINGS: Negative for acute or chronic fracture. Negative for mass lesion.
Conus medullaris normal and terminates at L1. Paraspinous soft
tissues normal. Retroperitoneal structures negative.

L1-2:  Negative

L2-3: Mild disc bulging and mild facet degeneration. No significant
spinal stenosis

L3-4: Slight anterior slip. Moderate to advanced facet degeneration
bilaterally. Diffuse disc bulging. Moderate spinal stenosis. Mild
foraminal narrowing bilaterally

L4-5: 4 mm anterior slip with severe facet degeneration and diffuse
bulging of the disc. Moderate spinal stenosis. Subarticular and
foraminal encroachment of a moderate degree bilaterally. This is
greater on the right than the left.

L5-S1: Disc bulging and spondylosis. Bilateral facet degeneration.
Moderate foraminal encroachment bilaterally.
IMPRESSION: Moderate spinal stenosis at L3-4 with mild foraminal narrowing
bilaterally

Grade 1 anterior slip L4-5 with moderate spinal stenosis.
Subarticular and foraminal stenosis right greater than left.

Moderate foraminal encroachment bilaterally L5-S1 due to disc
bulging and facet hypertrophy.

## 2017-04-25 ENCOUNTER — Other Ambulatory Visit: Payer: Self-pay | Admitting: Physician Assistant

## 2017-05-02 ENCOUNTER — Ambulatory Visit (INDEPENDENT_AMBULATORY_CARE_PROVIDER_SITE_OTHER): Payer: Medicare Other | Admitting: Physician Assistant

## 2017-05-02 ENCOUNTER — Encounter: Payer: Self-pay | Admitting: Physician Assistant

## 2017-05-02 VITALS — BP 142/64 | HR 77 | Temp 98.3°F | Resp 16 | Ht 66.0 in | Wt 198.8 lb

## 2017-05-02 DIAGNOSIS — I10 Essential (primary) hypertension: Secondary | ICD-10-CM | POA: Diagnosis not present

## 2017-05-02 DIAGNOSIS — E785 Hyperlipidemia, unspecified: Secondary | ICD-10-CM | POA: Diagnosis not present

## 2017-05-02 NOTE — Progress Notes (Signed)
Patient ID: Patrick Villa MRN: [id number], DOB: 1933/04/17, 84 y.o. Date of Encounter: @DATE @  Chief Complaint:  No chief complaint on file.   HPI: 84 y.o. year old male  presents for routine f/u OV.   Year after year he has told me that he raises quite a garden. Corn, squash, cucumbers, tomatoes, green beans etc.  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  List the Names of Other Physician/Practitioners you currently use:  Sees urology once a year.  [place] ever 1 -2 years for screening. No glaucoma.  No other providers now.   AT OV 04/2015 he was having back pain, radicular symptoms. Obtained Imaging.   ADDENDUM ADDED 05/31/2015: Received OV note from Dr. Joya Salm at [facility]--OV note dated 05/12/15.  "Clinically, pt is doing really well, except for sensory changes. Information given for him to read. Told him that unless symptoms get worse, at present time no need to do surgery. Gave book about exercises, tips about dos and don'ts. He is going to give me a call and let me know how he is doing. "     01/12/2016: Today he reports that he occasionally gets a little bit of achy discomfort across his low back. Says that "something about the angle when he stands to wash dishes"-- when he standing at that angle is when he feels some discomfort across his low back.  Today he reports that 2-3 day weeks ago he developed pain in the left posterior neck and the pain was radiating down his left arm. Says this happened 2 or 3 weeks ago and is gradually getting better. Still feels some discomfort in the left posterior neck. No longer feeling any pain down the left arm. Feeling no tingling or numbness or weakness in the left arm or grip strength.  Has no other  complaints or concerns today. Otherwise says he has been feeling well. States that he is taking his blood pressure medications as directed. No adverse effects.  Says that when he recently went to give blood that at that time he was told his blood pressure was 130/82.  I told him that his blood pressure is reading high today. He says that he has not taken his dose for today yet but otherwise has been taking it daily as directed. At that OV--I added Bystolic  06/29/352: Today he reports that after he added the Bystolic he experienced some cramping discomfort. Thought it might be secondary to the Bystolic so he stopped Bystolic. Cramping resolved. Says his BP has been just fine even off of the Bystolic. He checks his blood pressure at home he also had it checked at the DOT physical and it has been reading get all those checks.  Today he also states that he has occasionally felt some discomfort in his left wrist and tingling in his fingers. Has felt this off and on intermittently but noticed it more yesterday. I noted that he plays the guitar. He says that he does not feel the symptoms while he is playing the guitar and he does not feel the symptoms at night.    11/01/2016: Today he reports that he has been feeling good. Today he actually apologizes that his shirt is wet with sweat. Says that  he was out working. Had been grinding up a stump into mulch and then has been using pitchfork to spread that mulch. Says he suddenly realized the time and had to rush to get here. He says that even with all of that exertion he has been feeling good and is having no angina symptoms.  I discussed that his blood pressure reading is slightly high today. He states that he checked it at home last night and got 131/68.  He has no concerns to address today. Says that he has been feeling good. He has been having no shortness of breath or dyspnea on exertion. Has had no chest pressure, chest heaviness, chest  tightness.  He is taking medications as directed with no adverse effects. No lightheadedness.  He says he did get to go to [place] recently. Says that he had a friend who got married and then they went there to [place] and they invited him and his wife to go with them. Says he actually spent his birthday there.  Says that he is still playing guitar at multiple churches. States that he has not been having any pain in his wrist anymore. That has resolved.   05/02/2017: He reports that he has been doing very well. He reports that he is having a garden again this year and already has a few things planted and started.  Says that he has just been helping his niece spread some gravel. Even with this exertion, he has had no shortness of breath and no chest pressure, chest heaviness, chest tightness, or squeezing. He continues to play guitar at churches. Today I asked if he had had his blood pressure checked anywhere else.  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 He is taking blood pressure medications as directed and is having no adverse effects.  No lightheadedness. Has no specific concerns to address.   Past Medical History:  Diagnosis Date  . Colon polyps   . COPD (chronic obstructive pulmonary disease) ([place])   . Echocardiogram abnormal   . Ganglion cyst   . Hematuria   . Hyperlipidemia   . Hypertension   . Systolic murmur      Home Meds: Outpatient Medications Prior to Visit  Medication Sig Dispense Refill  . aspirin 325 MG tablet Take 325 mg by mouth daily.      . benazepril-hydrochlorthiazide (LOTENSIN HCT) 20-25 MG tablet TAKE 1 TABLET BY MOUTH  DAILY 90 tablet 2  . Cholecalciferol (VITAMIN D) 2000 UNITS CAPS Take 1 capsule (2,000 Units total) by mouth daily. 30 capsule 11  . diltiazem (CARDIZEM) 90 MG tablet Take 1 tablet (90 mg total) by mouth 3 (three) times  daily. 90 tablet 3  . diltiazem (CARDIZEM) 90 MG tablet TAKE 1 TABLET BY MOUTH 3  TIMES DAILY 90 tablet 3  . finasteride (PROSCAR) 5 MG tablet Take 1 tablet by mouth daily 90 tablet 11  . terazosin (HYTRIN) 10 MG capsule Take 1 capsule by mouth daily  HS 90 capsule 6   No facility-administered medications prior to visit.     Allergies:  Allergies  Allergen Reactions  . Amlodipine Besy-Benazepril Hcl     Jittery, gets confused.  . Lipitor [Atorvastatin Calcium]     Weakness.  . Zocor [Simvastatin]   . Bactrim [Sulfamethoxazole-Trimethoprim] Rash  . Pravachol Hives and Rash    Social  History   Socioeconomic History  . Marital status: Widowed    Spouse name: Not on file  . Number of children: Not on file  . Years of education: Not on file  . Highest education level: Not on file  Occupational History  . Not on file  Social Needs  . Financial resource strain: Not on file  . Food insecurity:    Worry: Not on file    Inability: Not on file  . Transportation needs:    Medical: Not on file    Non-medical: Not on file  Tobacco Use  . Smoking status: Former Smoker    Last attempt to quit: 10/18/1981    Years since quitting: 35.5  . Smokeless tobacco: Never Used  Substance and Sexual Activity  . Alcohol use: No  . Drug use: No  . Sexual activity: Not on file  Lifestyle  . Physical activity:    Days per week: Not on file    Minutes per session: Not on file  . Stress: Not on file  Relationships  . Social connections:    Talks on phone: Not on file    Gets together: Not on file    Attends religious service: Not on file    Active member of club or organization: Not on file    Attends meetings of clubs or organizations: Not on file    Relationship status: Not on file  . Intimate partner violence:    Fear of current or ex partner: Not on file    Emotionally abused: Not on file    Physically abused: Not on file    Forced sexual activity: Not on file  Other Topics Concern   . 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   No other exercise   Plays guitar.   Separated. Lives alone.    Family History  Problem Relation Age of Onset  . Diabetes Mother   . Stroke Brother      Review of Systems:  See HPI for pertinent ROS. All other ROS negative.    Physical Exam: Blood pressure (!) 142/64, pulse 77, temperature 98.3 F (36.8 C), temperature source Oral, resp. rate 16, height 5\' 6"  (1.676 m), weight 90.2 kg (198 lb 12.8 oz), SpO2 97 %., There is no height or weight on file to calculate BMI. General: WNWD AAM. Very Pleasant. Appears in no acute distress. Neck: Supple. No thyromegaly. No lymphadenopathy. No carotid bruits (he does have radiation of murmur to right carotid but c/w this, not carotid bruit). Lungs: Clear bilaterally to auscultation without wheezes, rales, or rhonchi. Breathing is unlabored. Heart: RRR with S1 S2. II/VI murmur at 2nd ICS on Right. Musculoskeletal:  Strength and tone normal for age. Extremities/Skin: Warm and dry.No LE edema. Neuro: Alert and oriented X 3. Moves all extremities spontaneously. Gait is normal. CNII-XII grossly in tact. Psych:  Responds to questions appropriately with a normal affect.     ASSESSMENT AND PLAN:  84 y.o. year old male with   1. Essential hypertension 05/02/2017: Blood Pressure is at goal/controlled. Continue current medications without change. Check labs to monitor. - BASIC METABOLIC PANEL WITH GFR  Hyperlipidemia He has a history of intolerance to Zocor, Lipitor, Pravachol. In the past he tolerated Crestor but quit this secondary to cost. He is been off of medication since April 2009. We had rechecked his lipids since been off of medication --checked 09/2012--off medication--LDL was 122. numbers were  good so we decided he was fine to stay off of medication.  He rechecked FLP at [facility] 12/2013--LDL-113.  01/12/2016: Reviwed this info  today--stay off lipid medication 05/02/2017: He can stay off lipid medication. He is not fasting today so we'll wait to monitor and future.  Systolic murmur 03/02/3333: He had follow-up echo 11/08/16 that showed no significant abnormality.  Vitamin D Deficiency:  At labs 12/2013 recommended to take vitamin D 2000 IUs daily. Patient states that he is taking this at this dose. Recheck lab 12/2014--Vit D level WNL---to cont same dose---can wait to recheck lab level 01/12/2016: Reviewed above. Cont current dose Vit D  Colon polyps Last colonoscopy was June 2012. To repeat 3 years which will be June 2015. I wrote on his AVS at [place] 05/31/13 to call Dr.Buccini/[facility] to schedule.  05/02/2017: Pt says he had colonoscopy performed--2015--not sure if supposed to repeat 5 years or 10 years. I do not see report scanned in Epic   BPH/hematuria  --He sees urology routinely for these 05/02/2017: --Cont f/u with Urology  Immunizations A Pneumovax--- November 2012.  Prevnar 13--given here 05/31/2013 B Tetanus--- May 2012 C Zostavax:He said he did check with his insurance company but it was going to cost significant cost to him he is deferring. D influenza vaccine: He received --12/2013, 09/2014, 10/2016     F/U OV 6 months, sooner if needed.   Marin Olp [place], [place], [facility] 05/02/2017 2:01 PM

## 2017-05-03 LAB — BASIC METABOLIC PANEL WITH GFR
BUN: 14 mg/dL (ref 7–25)
CHLORIDE: 104 mmol/L (ref 98–110)
CO2: 28 mmol/L (ref 20–32)
Calcium: 9.9 mg/dL (ref 8.6–10.3)
Creat: 1.1 mg/dL (ref 0.70–1.11)
GFR, Est African American: 73 mL/min/{1.73_m2} (ref >=60)
GFR, Est Non African American: 63 mL/min/{1.73_m2} (ref >=60)
GLUCOSE: 123 mg/dL — AB (ref 65–99)
POTASSIUM: 3.7 mmol/L (ref 3.5–5.3)
Sodium: 141 mmol/L (ref 135–146)

## 2017-05-03 LAB — EXTRA LAV TOP TUBE

## 2017-09-14 ENCOUNTER — Other Ambulatory Visit: Payer: Self-pay | Admitting: Physician Assistant

## 2017-09-30 DIAGNOSIS — R8271 Bacteriuria: Secondary | ICD-10-CM | POA: Diagnosis not present

## 2017-09-30 DIAGNOSIS — R3121 Asymptomatic microscopic hematuria: Secondary | ICD-10-CM | POA: Diagnosis not present

## 2017-10-24 DIAGNOSIS — H25813 Combined forms of age-related cataract, bilateral: Secondary | ICD-10-CM | POA: Diagnosis not present

## 2017-10-24 DIAGNOSIS — H04203 Unspecified epiphora, bilateral lacrimal glands: Secondary | ICD-10-CM | POA: Diagnosis not present

## 2017-10-24 DIAGNOSIS — H1013 Acute atopic conjunctivitis, bilateral: Secondary | ICD-10-CM | POA: Diagnosis not present

## 2017-11-02 ENCOUNTER — Ambulatory Visit (INDEPENDENT_AMBULATORY_CARE_PROVIDER_SITE_OTHER): Payer: Medicare Other | Admitting: Physician Assistant

## 2017-11-02 ENCOUNTER — Encounter: Payer: Self-pay | Admitting: Physician Assistant

## 2017-11-02 ENCOUNTER — Ambulatory Visit: Payer: Self-pay | Admitting: Physician Assistant

## 2017-11-02 VITALS — BP 142/68 | HR 67 | Temp 97.9°F | Resp 18 | Ht 66.0 in | Wt 204.0 lb

## 2017-11-02 DIAGNOSIS — I10 Essential (primary) hypertension: Secondary | ICD-10-CM | POA: Diagnosis not present

## 2017-11-02 DIAGNOSIS — Z23 Encounter for immunization: Secondary | ICD-10-CM | POA: Diagnosis not present

## 2017-11-02 NOTE — Progress Notes (Signed)
Patient ID: Patrick Villa MRN: [id number], DOB: [birth year], 84 y.o. Date of Encounter: @DATE @  Chief Complaint:  Chief Complaint  Patient presents with  . 6 month follow up  . Hypertension  . Headache  . Flu Vaccine    HPI: 84 y.o. year old male  presents for routine f/u OV.   Year after year he has told me that he raises quite a garden. Corn, squash, cucumbers, tomatoes, green beans etc.  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  List the Names of Other Physician/Practitioners you currently use:  Sees urology once a year.  [place] ever 1 -2 years for screening. No glaucoma.  No other providers now.   AT OV 04/2015 he was having back pain, radicular symptoms. Obtained Imaging.   ADDENDUM ADDED 05/31/2015: Received OV note from Dr. Joya Salm at [facility]--OV note dated 05/12/15.  "Clinically, pt is doing really well, except for sensory changes. Information given for him to read. Told him that unless symptoms get worse, at present time no need to do surgery. Gave book about exercises, tips about dos and don'ts. He is going to give me a call and let me know how he is doing. "     01/12/2016: Today he reports that he occasionally gets a little bit of achy discomfort across his low back. Says that "something about the angle when he stands to wash dishes"-- when he standing at that angle is when he feels some discomfort across his low back.  Today he reports that 2-3 day weeks ago he developed pain in the left posterior neck and the pain was radiating down his left arm. Says this happened 2 or 3 weeks ago and is gradually getting better. Still feels some discomfort in the left posterior neck. No longer feeling any pain down the left arm. Feeling no  tingling or numbness or weakness in the left arm or grip strength.  Has no other complaints or concerns today. Otherwise says he has been feeling well. States that he is taking his blood pressure medications as directed. No adverse effects.  Says that when he recently went to give blood that at that time he was told his blood pressure was 130/82.  I told him that his blood pressure is reading high today. He says that he has not taken his dose for today yet but otherwise has been taking it daily as directed. At that OV--I added Bystolic  07/02/80: Today he reports that after he added the Bystolic he experienced some cramping discomfort. Thought it might be secondary to the Bystolic so he stopped Bystolic. Cramping resolved. Says his BP has been just fine even off of the Bystolic. He checks his blood pressure at home he also had it checked at the DOT physical and it has been reading get all those checks.  Today he also states that he has occasionally felt some discomfort in his left wrist and tingling in his fingers. Has felt this off and on intermittently but noticed it more yesterday. I noted that he plays the guitar. He says that he does not feel the symptoms while he is playing the guitar and he does not feel the symptoms at night.    11/01/2016: Today he reports that  he has been feeling good. Today he actually apologizes that his shirt is wet with sweat. Says that he was out working. Had been grinding up a stump into mulch and then has been using pitchfork to spread that mulch. Says he suddenly realized the time and had to rush to get here. He says that even with all of that exertion he has been feeling good and is having no angina symptoms.  I discussed that his blood pressure reading is slightly high today. He states that he checked it at home last night and got 131/68.  He has no concerns to address today. Says that he has been feeling good. He has been having no shortness of breath or  dyspnea on exertion. Has had no chest pressure, chest heaviness, chest tightness.  He is taking medications as directed with no adverse effects. No lightheadedness.  He says he did get to go to [place] recently. Says that he had a friend who got married and then they went there to [place] and they invited him and his wife to go with them. Says he actually spent his birthday there.  Says that he is still playing guitar at multiple churches. States that he has not been having any pain in his wrist anymore. That has resolved.   05/02/2017: He reports that he has been doing very well. He reports that he is having a garden again this year and already has a few things planted and started.  Says that he has just been helping his niece spread some gravel. Even with this exertion, he has had no shortness of breath and no chest pressure, chest heaviness, chest tightness, or squeezing. He continues to play guitar at churches. Today I asked if he had had his blood pressure checked anywhere else.  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 He is taking blood pressure medications as directed and is having no adverse effects.  No lightheadedness. Has no specific concerns to address.   11/02/2017: Today he reports he has been doing very well. As usual, he has a big smile on his face and is very pleasant. He reports that he has no medical updates to report.  States that he has continued to go to urology annually.  Has had to see no other specialist recently. Asked how his garden did this summer.  Says that he had a groundhog that ate everything as soon as it would come up. States that he continues to play guitar at churches. He is taking his blood pressure medications as directed and is having no adverse effects.  No lightheadedness. No concerns to address today.   Past Medical History:    Diagnosis Date  . Colon polyps   . COPD (chronic obstructive pulmonary disease) ([place])   . Echocardiogram abnormal   . Ganglion cyst   . Hematuria   . Hyperlipidemia   . Hypertension   . Systolic murmur      Home Meds: Outpatient Medications Prior to Visit  Medication Sig Dispense Refill  . aspirin 325 MG tablet Take 325 mg by mouth daily.      . benazepril-hydrochlorthiazide (LOTENSIN HCT) 20-25 MG tablet TAKE 1 TABLET BY MOUTH  DAILY 90 tablet 2  . Cholecalciferol (VITAMIN D) 2000 UNITS CAPS Take 1 capsule (2,000 Units total) by mouth daily. 30 capsule 11  . diltiazem (CARDIZEM)  90 MG tablet Take 1 tablet (90 mg total) by mouth 3 (three) times daily. 90 tablet 3  . finasteride (PROSCAR) 5 MG tablet Take 1 tablet by mouth daily 90 tablet 11  . terazosin (HYTRIN) 10 MG capsule Take 1 capsule by mouth daily  HS 90 capsule 6  . diltiazem (CARDIZEM) 90 MG tablet TAKE 1 TABLET BY MOUTH 3  TIMES DAILY 90 tablet 3   No facility-administered medications prior to visit.     Allergies:  Allergies  Allergen Reactions  . Amlodipine Besy-Benazepril Hcl     Jittery, gets confused.  . Lipitor [Atorvastatin Calcium]     Weakness.  . Zocor [Simvastatin]   . Bactrim [Sulfamethoxazole-Trimethoprim] Rash  . Pravachol Hives and Rash    Social History   Socioeconomic History  . Marital status: Widowed    Spouse name: Not on file  . Number of children: Not on file  . Years of education: Not on file  . Highest education level: Not on file  Occupational History  . Not on file  Social Needs  . Financial resource strain: Not on file  . Food insecurity:    Worry: Not on file    Inability: Not on file  . Transportation needs:    Medical: Not on file    Non-medical: Not on file  Tobacco Use  . Smoking status: Former Smoker    Last attempt to quit: 10/18/1981    Years since quitting: 36.0  . Smokeless tobacco: Never Used  Substance and Sexual Activity  . Alcohol use: No  . Drug use:  No  . Sexual activity: Not on file  Lifestyle  . Physical activity:    Days per week: Not on file    Minutes per session: Not on file  . Stress: Not on file  Relationships  . Social connections:    Talks on phone: Not on file    Gets together: Not on file    Attends religious service: Not on file    Active member of club or organization: Not on file    Attends meetings of clubs or organizations: Not on file    Relationship status: Not on file  . Intimate partner violence:    Fear of current or ex partner: Not on file    Emotionally abused: Not on file    Physically abused: Not on file    Forced sexual activity: Not on file  Other Topics Concern  . 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   No other exercise   Plays guitar.   Separated. Lives alone.    Family History  Problem Relation Age of Onset  . Diabetes Mother   . Stroke Brother      Review of Systems:  See HPI for pertinent ROS. All other ROS negative.    Physical Exam: Blood pressure (!) 142/68, pulse 67, temperature 97.9 F (36.6 C), temperature source Oral, resp. rate 18, height 5\' 6"  (1.676 m), weight 92.5 kg, SpO2 97 %., Body mass index is 32.93 kg/m. General:  WNWD AAM. Appears in no acute distress. Neck: Supple. No thyromegaly. No lymphadenopathy. No carotid bruits. Lungs: Clear bilaterally to auscultation without wheezes, rales, or rhonchi. Breathing is unlabored. Heart: Regular Rhythm. II/VI murmur right 2nd ICS. Abdomen: Soft, non-tender, non-distended with normoactive bowel sounds. No hepatomegaly. No rebound/guarding. No obvious abdominal masses. Musculoskeletal:  Strength and tone normal for age. Extremities/Skin: Warm and dry.  No LE edema. Neuro: Alert and oriented X 3. Moves all extremities spontaneously. Gait is normal. CNII-XII grossly in tact. Psych:  Responds to questions appropriately with a normal affect.      ASSESSMENT AND  PLAN:  84 y.o. year old male with   1. Essential hypertension 11/02/2017: Blood Pressure is at goal/controlled. Continue current medications without change. Check labs to monitor. - BASIC METABOLIC PANEL WITH GFR  Hyperlipidemia He has a history of intolerance to Zocor, Lipitor, Pravachol. In the past he tolerated Crestor but quit this secondary to cost. He is been off of medication since April 2009. We had rechecked his lipids since been off of medication --checked 09/2012--off medication--LDL was 122. numbers were good so we decided he was fine to stay off of medication.  He rechecked FLP at [facility] 12/2013--LDL-113.  01/12/2016: Reviwed this info today--stay off lipid medication 11/02/2017: He can stay off lipid medication. He is not fasting today so we'll wait to monitor and future.  Systolic murmur 41/28786: He had follow-up echo 11/08/16 that showed no significant abnormality.  Vitamin D Deficiency:  At labs 12/2013 recommended to take vitamin D 2000 IUs daily. Patient states that he is taking this at this dose. Recheck lab 12/2014--Vit D level WNL---to cont same dose---can wait to recheck lab level 01/12/2016: Reviewed above. Cont current dose Vit D  Colon polyps Last colonoscopy was June 2012. To repeat 3 years which will be June 2015. I wrote on his AVS at [place] 05/31/13 to call Dr.Buccini/[facility] to schedule.  11/02/2017: Pt says he had colonoscopy performed--2015--not sure if supposed to repeat 5 years or 10 years. I do not see report scanned in Epic   BPH/hematuria  --He sees urology routinely for these 11/02/2017: --Cont f/u with Urology  Immunizations A Pneumovax--- November 2012.  Prevnar 13--given here 05/31/2013 B Tetanus--- May 2012 C Zostavax:He said he did check with his insurance company but it was going to cost significant cost to him he is deferring. D influenza vaccine: He received --12/2013, 09/2014, 10/2016, 11/02/2017     F/U OV 6 months, sooner if  needed.   [address] [place], [place], [facility] 11/02/2017 12:49 PM

## 2017-11-03 LAB — BASIC METABOLIC PANEL WITH GFR
BUN/Creatinine Ratio: 16 (calc) (ref 6–22)
BUN: 19 mg/dL (ref 7–25)
CALCIUM: 9.8 mg/dL (ref 8.6–10.3)
CHLORIDE: 102 mmol/L (ref 98–110)
CO2: 29 mmol/L (ref 20–32)
CREATININE: 1.18 mg/dL — AB (ref 0.70–1.11)
GFR, EST AFRICAN AMERICAN: 66 mL/min/{1.73_m2} (ref >=60)
GFR, EST NON AFRICAN AMERICAN: 57 mL/min/{1.73_m2} — AB (ref >=60)
Glucose, Bld: 129 mg/dL — ABNORMAL HIGH (ref 65–99)
Potassium: 3.5 mmol/L (ref 3.5–5.3)
Sodium: 139 mmol/L (ref 135–146)

## 2017-11-28 DIAGNOSIS — H1013 Acute atopic conjunctivitis, bilateral: Secondary | ICD-10-CM | POA: Diagnosis not present

## 2017-11-28 DIAGNOSIS — H25813 Combined forms of age-related cataract, bilateral: Secondary | ICD-10-CM | POA: Diagnosis not present

## 2018-01-27 ENCOUNTER — Emergency Department (HOSPITAL_COMMUNITY): Payer: Medicare Other

## 2018-01-27 ENCOUNTER — Encounter (HOSPITAL_COMMUNITY): Payer: Self-pay | Admitting: *Deleted

## 2018-01-27 ENCOUNTER — Observation Stay (HOSPITAL_COMMUNITY)
Admission: EM | Admit: 2018-01-27 | Discharge: 2018-01-28 | Disposition: A | Discharge status: Home / Self Care | Payer: Medicare Other | Attending: Internal Medicine | Admitting: Internal Medicine

## 2018-01-27 ENCOUNTER — Other Ambulatory Visit: Payer: Self-pay

## 2018-01-27 DIAGNOSIS — E785 Hyperlipidemia, unspecified: Secondary | ICD-10-CM | POA: Insufficient documentation

## 2018-01-27 DIAGNOSIS — Z7982 Long term (current) use of aspirin: Secondary | ICD-10-CM | POA: Diagnosis not present

## 2018-01-27 DIAGNOSIS — R42 Dizziness and giddiness: Secondary | ICD-10-CM | POA: Insufficient documentation

## 2018-01-27 DIAGNOSIS — I10 Essential (primary) hypertension: Secondary | ICD-10-CM | POA: Diagnosis not present

## 2018-01-27 DIAGNOSIS — J449 Chronic obstructive pulmonary disease, unspecified: Secondary | ICD-10-CM | POA: Insufficient documentation

## 2018-01-27 DIAGNOSIS — E7800 Pure hypercholesterolemia, unspecified: Secondary | ICD-10-CM

## 2018-01-27 DIAGNOSIS — Z79899 Other long term (current) drug therapy: Secondary | ICD-10-CM | POA: Insufficient documentation

## 2018-01-27 DIAGNOSIS — R079 Chest pain, unspecified: Secondary | ICD-10-CM | POA: Diagnosis present

## 2018-01-27 DIAGNOSIS — I441 Atrioventricular block, second degree: Secondary | ICD-10-CM

## 2018-01-27 DIAGNOSIS — R0789 Other chest pain: Principal | ICD-10-CM | POA: Insufficient documentation

## 2018-01-27 DIAGNOSIS — Z87891 Personal history of nicotine dependence: Secondary | ICD-10-CM | POA: Insufficient documentation

## 2018-01-27 LAB — BASIC METABOLIC PANEL
Anion gap: 7 (ref 5–15)
BUN: 12 mg/dL (ref 8–23)
CO2: 27 mmol/L (ref 22–32)
Calcium: 9.9 mg/dL (ref 8.9–10.3)
Chloride: 106 mmol/L (ref 98–111)
Creatinine, Ser: 1.08 mg/dL (ref 0.61–1.24)
GFR calc Af Amer: >60 mL/min (ref >60)
GFR calc non Af Amer: >60 mL/min (ref >60)
Glucose, Bld: 117 mg/dL — ABNORMAL HIGH (ref 70–99)
POTASSIUM: 3.7 mmol/L (ref 3.5–5.1)
Sodium: 140 mmol/L (ref 135–145)

## 2018-01-27 LAB — CBC
HCT: 38 % — ABNORMAL LOW (ref 39.0–52.0)
HCT: 35.2 % — ABNORMAL LOW (ref 39.0–52.0)
HEMOGLOBIN: 11.5 g/dL — AB (ref 13.0–17.0)
HEMOGLOBIN: 11.1 g/dL — AB (ref 13.0–17.0)
MCH: 26.6 pg (ref 26.0–34.0)
MCH: 27.8 pg (ref 26.0–34.0)
MCHC: 30.3 g/dL (ref 30.0–36.0)
MCHC: 31.5 g/dL (ref 30.0–36.0)
MCV: 88 fL (ref 80.0–100.0)
MCV: 88.2 fL (ref 80.0–100.0)
Platelets: 256 10*3/uL (ref 150–400)
Platelets: 243 10*3/uL (ref 150–400)
RBC: 4.32 MIL/uL (ref 4.22–5.81)
RBC: 3.99 MIL/uL — ABNORMAL LOW (ref 4.22–5.81)
RDW: 13.5 % (ref 11.5–15.5)
RDW: 13.4 % (ref 11.5–15.5)
WBC: 7.8 10*3/uL (ref 4.0–10.5)
WBC: 8.1 10*3/uL (ref 4.0–10.5)
nRBC: 0 % (ref 0.0–0.2)
nRBC: 0 % (ref 0.0–0.2)

## 2018-01-27 LAB — LIPASE, BLOOD: Lipase: 19 U/L (ref 11–51)

## 2018-01-27 LAB — CREATININE, SERUM
Creatinine, Ser: 0.96 mg/dL (ref 0.61–1.24)
GFR calc Af Amer: >60 mL/min (ref >60)

## 2018-01-27 LAB — HEPATIC FUNCTION PANEL
ALT: 27 U/L (ref 0–44)
AST: 21 U/L (ref 15–41)
Albumin: 3.6 g/dL (ref 3.5–5.0)
Alkaline Phosphatase: 36 U/L — ABNORMAL LOW (ref 38–126)
Bilirubin, Direct: <0.1 mg/dL (ref 0.0–0.2)
Total Bilirubin: 0.5 mg/dL (ref 0.3–1.2)
Total Protein: 6.9 g/dL (ref 6.5–8.1)

## 2018-01-27 LAB — I-STAT TROPONIN, ED: Troponin i, poc: 0.02 ng/mL (ref 0.00–0.08)

## 2018-01-27 LAB — TROPONIN I
Troponin I: <0.03 ng/mL (ref <0.03)
Troponin I: <0.03 ng/mL (ref <0.03)
Troponin I: <0.03 ng/mL (ref <0.03)

## 2018-01-27 MED ORDER — DILTIAZEM HCL 90 MG PO TABS: 90.0000 mg | ORAL_TABLET | Freq: Three times a day (TID) | ORAL | Status: DC

## 2018-01-27 MED ORDER — HEPARIN SODIUM (PORCINE) 5000 UNIT/ML IJ SOLN
5000.0000 [IU] | Freq: Three times a day (TID) | INTRAMUSCULAR | Status: DC
  Administered 2018-01-27 – 2018-01-28 (×2): 5000 [IU] via SUBCUTANEOUS
  Filled 2018-01-27 (×2): qty 1

## 2018-01-27 MED ORDER — NITROGLYCERIN 0.4 MG SL SUBL
0.4000 mg | SUBLINGUAL_TABLET | SUBLINGUAL | Status: DC | PRN
  Administered 2018-01-27 (×2): 0.4 mg via SUBLINGUAL
  Filled 2018-01-27: qty 1

## 2018-01-27 MED ORDER — MORPHINE SULFATE (PF) 4 MG/ML IV SOLN
4.0000 mg | Freq: Once | INTRAVENOUS | Status: DC
  Filled 2018-01-27: qty 1

## 2018-01-27 MED ORDER — VITAMIN D 25 MCG (1000 UNIT) PO TABS
1000.0000 [IU] | ORAL_TABLET | Freq: Every day | ORAL | Status: DC
  Administered 2018-01-28: 1000 [IU] via ORAL
  Filled 2018-01-27 (×2): qty 1

## 2018-01-27 MED ORDER — ASPIRIN 81 MG PO CHEW
324.0000 mg | CHEWABLE_TABLET | Freq: Once | ORAL | Status: AC
  Administered 2018-01-27: 324 mg via ORAL
  Filled 2018-01-27: qty 4

## 2018-01-27 MED ORDER — HYDROCHLOROTHIAZIDE 25 MG PO TABS
25.0000 mg | ORAL_TABLET | Freq: Every day | ORAL | Status: DC
  Administered 2018-01-28: 25 mg via ORAL
  Filled 2018-01-27 (×2): qty 1

## 2018-01-27 MED ORDER — FINASTERIDE 5 MG PO TABS
5.0000 mg | ORAL_TABLET | Freq: Every day | ORAL | Status: DC
  Administered 2018-01-28: 5 mg via ORAL
  Filled 2018-01-27 (×2): qty 1

## 2018-01-27 MED ORDER — ASPIRIN 325 MG PO TABS
325.0000 mg | ORAL_TABLET | Freq: Every day | ORAL | Status: DC
  Administered 2018-01-28: 325 mg via ORAL
  Filled 2018-01-27 (×2): qty 1

## 2018-01-27 MED ORDER — BENAZEPRIL-HYDROCHLOROTHIAZIDE 20-25 MG PO TABS: 1.0000 | ORAL_TABLET | Freq: Every day | ORAL | Status: DC

## 2018-01-27 MED ORDER — ONDANSETRON HCL 4 MG/2ML IJ SOLN: 4.0000 mg | Freq: Four times a day (QID) | INTRAMUSCULAR | Status: DC | PRN

## 2018-01-27 MED ORDER — TERAZOSIN HCL 5 MG PO CAPS
10.0000 mg | ORAL_CAPSULE | Freq: Every day | ORAL | Status: DC
  Administered 2018-01-27: 10 mg via ORAL
  Filled 2018-01-27 (×2): qty 2

## 2018-01-27 MED ORDER — ACETAMINOPHEN 325 MG PO TABS: 650.0000 mg | ORAL_TABLET | ORAL | Status: DC | PRN

## 2018-01-27 MED ORDER — BENAZEPRIL HCL 20 MG PO TABS
20.0000 mg | ORAL_TABLET | Freq: Every day | ORAL | Status: DC
  Administered 2018-01-28: 20 mg via ORAL
  Filled 2018-01-27 (×2): qty 1

## 2018-01-27 NOTE — ED Notes (Signed)
Two unsuccessful IV attempts.

## 2018-01-27 NOTE — Progress Notes (Signed)
Pt received from ED. Pt and his friend "Hassan Rowan" at bedside oriented to room and equipment. VSS. Telemetry applied, shows 2nd degree block. Diet ordered per pt request. Pt denies chest pain at this time.  Fritz Pickerel, RN

## 2018-01-27 NOTE — Consult Note (Addendum)
Admit date: 01/27/2018 Referring Physician  Dr. Waldron Labs  Primary Cardiologist  None Reason for Consultation  Bilateral breast sensitivity  HPI: Patrick Villa is a 85 y.o. male who is being seen today for the evaluation of bilateral skin sensitivity over breasts at the request of Dr. Waldron Labs.  85 year old extremely pleasant African-American male with a history of COPD, hyperlipidemia, hypertension and colonic polyps who was in his usual state of health until after he went to the barbershop around 10 AM.  On the way home he says he started feeling dizzy but that abated without further issues.  He then says he started to feel "like the skin was getting tight over his titties".  He grabbed both sides of his breast saying that the skin just feels tight.  He says it is on top the skin.  He said he also feels a little bit of pressure over the inferior aspect of his sternum when you push on it.  When he let go from palpation he still has a sensation that it is there but very minimal.  There is no radiation of his symptoms to his arms neck or jaw.  There is no associated symptoms of nausea, diaphoresis or shortness of breath.  This discomfort is not exertional and nothing makes it better or worse.  In the ER troponin was 0.02 for point-of-care and regular troponin less than 0.03.  Labs are normal except for a mildly low hemoglobin 11.1.  Chest x-ray showed no active disease.   PMH:   Past Medical History:  Diagnosis Date  . Colon polyps   . COPD (chronic obstructive pulmonary disease) ([place])   . Echocardiogram abnormal   . Ganglion cyst   . Hematuria   . Hyperlipidemia   . Hypertension   . Systolic murmur      PSH:   Past Surgical History:  Procedure Laterality Date  . COLON SURGERY    . TRANSTHORACIC ECHOCARDIOGRAM      Allergies:  Amlodipine besy-benazepril hcl; Lipitor [atorvastatin calcium]; Zocor [simvastatin]; Bactrim [sulfamethoxazole-trimethoprim]; and Pravachol Prior to Admit  Meds:  (Not in a hospital admission)  Fam HX:    Family History  Problem Relation Age of Onset  . Diabetes Mother   . Stroke Brother    Social HX:    Social History   Socioeconomic History  . Marital status: Widowed    Spouse name: Not on file  . Number of children: Not on file  . Years of education: Not on file  . Highest education level: Not on file  Occupational History  . Not on file  Social Needs  . Financial resource strain: Not on file  . Food insecurity:    Worry: Not on file    Inability: Not on file  . Transportation needs:    Medical: Not on file    Non-medical: Not on file  Tobacco Use  . Smoking status: Former Smoker    Last attempt to quit: 10/18/1981    Years since quitting: 36.3  . Smokeless tobacco: Never Used  Substance and Sexual Activity  . Alcohol use: No  . Drug use: No  . Sexual activity: Not on file  Lifestyle  . Physical activity:    Days per week: Not on file    Minutes per session: Not on file  . Stress: Not on file  Relationships  . Social connections:    Talks on phone: Not on file    Gets together: Not on file  Attends religious service: Not on file    Active member of club or organization: Not on file    Attends meetings of clubs or organizations: Not on file    Relationship status: Not on file  . Intimate partner violence:    Fear of current or ex partner: Not on file    Emotionally abused: Not on file    Physically abused: Not on file    Forced sexual activity: Not on file  Other Topics Concern  . 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   No other exercise   Plays guitar.   Separated. Lives alone.     ROS:  All  ROS were addressed and are negative except what is stated in the HPI  Physical Exam: Blood pressure 121/65, pulse 79, temperature 98 F (36.7 C), temperature source Oral, resp. rate 11, SpO2 99 %.    General: Well developed, well nourished, in no  acute distress Head: Eyes PERRLA, No xanthomas.   Normal cephalic and atramatic  Lungs:   Clear bilaterally to auscultation and percussion. Heart:   HRRR S1 S2 Pulses are 2+ & equal.            No carotid bruit. No JVD.  No abdominal bruits. No femoral bruits. Abdomen: Bowel sounds are positive, abdomen soft and non-tender without masses or                  Hernia's noted. Msk:  Back normal, normal gait. Normal strength and tone for age. Extremities:   No clubbing, cyanosis or edema.  DP +1 Neuro: Alert and oriented X 3. Psych:  Good affect, responds appropriately    Labs:   Lab Results  Component Value Date   WBC 8.1 01/27/2018   HGB 11.1 (L) 01/27/2018   HCT 35.2 (L) 01/27/2018   MCV 88.2 01/27/2018   PLT 243 01/27/2018    Recent Labs  Lab 01/27/18 1157 01/27/18 1548  NA 140  --   K 3.7  --   CL 106  --   CO2 27  --   BUN 12  --   CREATININE 1.08  --   CALCIUM 9.9  --   PROT  --  6.9  BILITOT  --  0.5  ALKPHOS  --  36*  ALT  --  27  AST  --  21  GLUCOSE 117*  --    No results found for: PTT No results found for: INR, PROTIME Lab Results  Component Value Date   TROPONINI <0.03 01/27/2018     Lab Results  Component Value Date   CHOL 166 01/02/2014   CHOL 172 10/18/2012   Lab Results  Component Value Date   HDL 43 01/02/2014   HDL 41 10/18/2012   Lab Results  Component Value Date   LDLCALC 113 (H) 01/02/2014   LDLCALC 122 (H) 10/18/2012   Lab Results  Component Value Date   TRIG 51 01/02/2014   TRIG 47 10/18/2012   Lab Results  Component Value Date   CHOLHDL 3.9 01/02/2014   CHOLHDL 4.2 10/18/2012   No results found for: LDLDIRECT    Radiology:  Dg Chest 2 View  Result Date: 01/27/2018 CLINICAL DATA:  Chest pain. EXAM: CHEST - 2 VIEW COMPARISON:  12/10/2002 report. FINDINGS: Mediastinum and hilar structures normal. Lungs are clear. No pleural effusion or pneumothorax. EKG leads noted over the chest. Degenerative change thoracic spine. Mild  thoracic spine scoliosis. IMPRESSION: No acute cardiopulmonary disease. Electronically Signed   By: Marcello Moores  Register   On: 01/27/2018 12:33     Telemetry    Normal sinus rhythm with intermittent second-degree type I Wenckebach AV block- Personally Reviewed  ECG    Normal sinus rhythm with first-degree AV block, left anterior fascicular block and septal infarct- Personally Reviewed   ASSESSMENT/PLAN:   1.  Atypical chest pain -Pain is very atypical and he describes it as "like the skin was getting tight over his titties".  -This pain is very atypical and really not consistent with coronary ischemia. -Does describe a very mild subtle pressure over the inferior aspect of his sternum that is reproduced by palpation and never really completely goes away -Troponin POC is normal and lab troponin normal. -EKG shows no ischemic ST changes -Continue to cycle troponin -IV heparin unless troponins become positive -2D echo in a.m. to assess LV function -Cardiac risk factors include hypertension, hyperlipidemia and advanced age  25.  Hypertension -He was hypertensive on admission at 170/94 mmHg.  Blood pressure is now 121/65 mmHg. -He normally takes Cardizem 90 mg 3 times daily. -Given his second-degree type I Wenckebach block on telemetry would recommend stopping calcium channel blocker and avoiding beta-blockers as well -Continue Lotensin HCT 20-25 mg daily and Hytrin 10 mg nightly -Currently has had an intolerance to amlodipine in the past in which he gets jittery and confused -Continue to follow blood pressure and may need addition of other agents if BP remains elevated  3.  Type I second-degree AV block -This is intermittent on telemetry -Recommend stopping Cardizem  -Avoid AV nodal blocking agents -He did have some dizzy spells earlier in the day which could have been possibly related so we will need to follow on telemetry for high-grade AV block  Fransico Him, MD  01/27/2018  5:08  PM

## 2018-01-27 NOTE — ED Triage Notes (Signed)
Pt in c/o chest pressure that started this morning, states it feels like a weight on his chest, denies shortness of breath denies n/v, no distress noted

## 2018-01-27 NOTE — ED Provider Notes (Signed)
[facility] EMERGENCY DEPARTMENT Provider Note   CSN: [id number] Arrival date & time: 01/27/18  1131     History   Chief Complaint Chief Complaint  Patient presents with  . Chest Pain    HPI AIMAN NOE is a 85 y.o. male.  HPI  85 year old male presents with chest pain.  Started around 10 AM while he was at the [employer].  Felt like his skin in his anterior chest was too tight.  Originally was about a 6 or 7 out of 10 but now has improved and now seems to be a pressure at the inferior aspect of his sternum.  It is about a 2 or 3 out of 10.  Does not radiate.  Nothing seems to make it better or worse.  He took a Tums but it did not help.  There is no other symptoms such as diaphoresis, nausea, vomiting, or dyspnea.  No leg swelling, back pain, or abdominal pain.  He denies any known cardiac history.  Past Medical History:  Diagnosis Date  . Colon polyps   . COPD (chronic obstructive pulmonary disease) ([place])   . Echocardiogram abnormal   . Ganglion cyst   . Hematuria   . Hyperlipidemia   . Hypertension   . Systolic murmur     Patient Active Problem List   Diagnosis Date Noted  . Vitamin D deficiency 07/17/2014  . Hypertension   . COPD (chronic obstructive pulmonary disease) ([place])   . Hyperlipidemia   . Colon polyps   . Systolic murmur   . Echocardiogram abnormal   . Hematuria   . Ganglion cyst     Past Surgical History:  Procedure Laterality Date  . COLON SURGERY    . TRANSTHORACIC ECHOCARDIOGRAM          Home Medications    Prior to Admission medications   Medication Sig Start Date End Date Taking? Authorizing Provider  aspirin 325 MG tablet Take 325 mg by mouth daily.      [provider]  benazepril-hydrochlorthiazide (LOTENSIN HCT) 20-25 MG tablet TAKE 1 TABLET BY MOUTH  DAILY 09/14/17   Orlena Sheldon, PA-C  Cholecalciferol (VITAMIN D) 2000 UNITS CAPS Take 1 capsule (2,000 Units total) by mouth daily. 07/17/14   Orlena Sheldon,  PA-C  diltiazem (CARDIZEM) 90 MG tablet Take 1 tablet (90 mg total) by mouth 3 (three) times daily. 11/25/16   Orlena Sheldon, PA-C  finasteride (PROSCAR) 5 MG tablet Take 1 tablet by mouth daily 11/25/16   Dena Billet B, PA-C  terazosin (HYTRIN) 10 MG capsule Take 1 capsule by mouth daily  HS 11/25/16   Orlena Sheldon, PA-C    Family History Family History  Problem Relation Age of Onset  . Diabetes Mother   . Stroke Brother     Social History Social History   Tobacco Use  . Smoking status: Former Smoker    Last attempt to quit: 10/18/1981    Years since quitting: 36.3  . Smokeless tobacco: Never Used  Substance Use Topics  . Alcohol use: No  . Drug use: No     Allergies   Amlodipine besy-benazepril hcl; Lipitor [atorvastatin calcium]; Zocor [simvastatin]; Bactrim [sulfamethoxazole-trimethoprim]; and Pravachol   Review of Systems Review of Systems  Constitutional: Negative for diaphoresis.  Respiratory: Negative for shortness of breath.   Cardiovascular: Positive for chest pain. Negative for leg swelling.  Gastrointestinal: Negative for abdominal pain, nausea and vomiting.  Musculoskeletal: Negative for back pain.  All other systems reviewed and are negative.    Physical Exam Updated Vital Signs BP (!) 168/66   Pulse 78   Temp 98 F (36.7 C) (Oral)   Resp 14   SpO2 100%   Physical Exam Vitals signs and nursing note reviewed.  Constitutional:      Appearance: He is well-developed. He is not ill-appearing or diaphoretic.  HENT:     Head: Normocephalic and atraumatic.     Right Ear: External ear normal.     Left Ear: External ear normal.     Nose: Nose normal.  Eyes:     General:        Right eye: No discharge.        Left eye: No discharge.  Neck:     Musculoskeletal: Neck supple.  Cardiovascular:     Rate and Rhythm: Normal rate and regular rhythm.     Pulses:          Radial pulses are 2+ on the right side and 2+ on the left side.     Heart sounds:  Murmur present. Systolic murmur present.  Pulmonary:     Effort: Pulmonary effort is normal.     Breath sounds: Normal breath sounds.  Abdominal:     Palpations: Abdomen is soft.     Tenderness: There is no abdominal tenderness.  Musculoskeletal:     Right lower leg: No edema.     Left lower leg: No edema.  Skin:    General: Skin is warm and dry.  Neurological:     Mental Status: He is alert.  Psychiatric:        Mood and Affect: Mood is not anxious.      ED Treatments / Results  Labs (all labs ordered are listed, but only abnormal results are displayed) Labs Reviewed  BASIC METABOLIC PANEL - Abnormal; Notable for the following components:      Result Value   Glucose, Bld 117 (*)    All other components within normal limits  CBC - Abnormal; Notable for the following components:   Hemoglobin 11.5 (*)    HCT 38.0 (*)    All other components within normal limits  HEPATIC FUNCTION PANEL  LIPASE, BLOOD  I-STAT TROPONIN, ED    EKG EKG Interpretation  Date/Time:  Friday January 27 2018 11:41:51 EST Ventricular Rate:  93 PR Interval:  232 QRS Duration: 108 QT Interval:  364 QTC Calculation: 452 R Axis:   -83 Text Interpretation:  Sinus rhythm with 1st degree A-V block Left anterior fascicular block Septal infarct , age undetermined Abnormal ECG No old tracing to compare Confirmed by Sherwood Gambler [phone]) on 01/27/2018 2:58:22 PM   Radiology Dg Chest 2 View  Result Date: 01/27/2018 CLINICAL DATA:  Chest pain. EXAM: CHEST - 2 VIEW COMPARISON:  12/10/2002 report. FINDINGS: Mediastinum and hilar structures normal. Lungs are clear. No pleural effusion or pneumothorax. EKG leads noted over the chest. Degenerative change thoracic spine. Mild thoracic spine scoliosis. IMPRESSION: No acute cardiopulmonary disease. Electronically Signed   By: Marcello Moores  Register   On: 01/27/2018 12:33    Procedures Procedures (including critical care time)  Medications Ordered in ED Medications   nitroGLYCERIN (NITROSTAT) SL tablet 0.4 mg (0.4 mg Sublingual Given 01/27/18 1526)  morphine 4 MG/ML injection 4 mg (has no administration in time range)  aspirin chewable tablet 324 mg (324 mg Oral Given 01/27/18 1518)     Initial Impression / Assessment and Plan / ED Course  I  have reviewed the triage vital signs and the nursing notes.  Pertinent labs & imaging results that were available during my care of the patient were reviewed by me and considered in my medical decision making (see chart for details).     Patient's heart score is a 5.  My suspicion for PE or dissection is pretty low.  His first troponin is negative.  He has a fascicular block but no obvious acute ST/T changes and no old to compare to.  While his pain is a little bit right-sided but mostly midline, I think is reasonable to add on some LFTs and lipase but I am still more concerned about cardiac cause.  Will be admitted to the hospitalist service.  He has been given to sublingual nitroglycerin with mild improvement in his pain but still has a little bit of discomfort.  Hospitalist request IV morphine to get his pain to 0.  Final Clinical Impressions(s) / ED Diagnoses   Final diagnoses:  Chest pain at rest    ED Discharge Orders    None       Sherwood Gambler, MD 01/27/18 1538

## 2018-01-27 NOTE — H&P (Signed)
[facility] H&P   Patient Demographics:    Mister Krahenbuhl, is a 85 y.o. male  MRN: [id number]   DOB - 07/19/1933  Admit Date - 01/27/2018  Outpatient Primary MD for the patient is Buelah Manis, Modena Nunnery, MD  Referring MD/NP/PA: Dr Regenia Skeeter  Patient coming from: Home  Chief Complaint  Patient presents with  . Chest Pain      HPI:    Ottavio Norem  is a 85 y.o. 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 -In ED troponins were negative, he is currently chest pain-free, no significant lab abnormalities, no previous EKG to compare, EKG in ED was significant for left fascicular block, he received full dose aspirin, I was called to admit.    Review of systems:    In addition to the HPI above, No Fever-chills, does report lightheadedness No Headache, No changes with Vision or hearing, No problems swallowing food or Liquids, For chest pain, no cough or Shortness of Breath, No Abdominal pain, No Nausea or Vommitting, Bowel movements are regular, No Blood in stool or Urine, No dysuria, No new skin rashes or bruises, No new joints pains-aches,  No new weakness, tingling, numbness in any extremity, No recent weight gain or loss, No polyuria, polydypsia or polyphagia, No significant Mental Stressors.  A full 10 point Review of Systems was done, except as stated above, all other Review of Systems were negative.   With Past History of the  following :    Past Medical History:  Diagnosis Date  . Colon polyps   . COPD (chronic obstructive pulmonary disease) ([place])   . Echocardiogram abnormal   . Ganglion cyst   . Hematuria   . Hyperlipidemia   . Hypertension   . Systolic murmur       Past Surgical History:  Procedure Laterality Date  . COLON SURGERY    . TRANSTHORACIC ECHOCARDIOGRAM        Social History:     Social History   Tobacco Use  . Smoking status: Former Smoker    Last attempt to quit: 10/18/1981    Years since quitting: 36.3  . Smokeless tobacco: Never Used  Substance Use Topics  . Alcohol use: No     Lives -home  Mobility -independent    Family History :     Family History  Problem Relation Age of Onset  . Diabetes Mother   . Stroke Brother       Home Medications:  Prior to Admission medications   Medication Sig Start Date End Date Taking? Authorizing Provider  aspirin 325 MG tablet Take 325 mg by mouth daily.     Yes [provider]  benazepril-hydrochlorthiazide (LOTENSIN HCT) 20-25 MG tablet TAKE 1 TABLET BY MOUTH  DAILY 09/14/17  Yes Dena Billet B, PA-C  carboxymethylcellulose (REFRESH PLUS) 0.5 % SOLN Place 1 drop into both eyes as needed (dry eyes).   Yes [provider]  Cholecalciferol (VITAMIN D) 2000 UNITS CAPS Take 1 capsule (2,000 Units total) by mouth daily. 07/17/14  Yes Orlena Sheldon, PA-C  diltiazem (CARDIZEM) 90 MG tablet Take 1 tablet (90 mg total) by mouth 3 (three) times daily. 11/25/16  Yes Orlena Sheldon, PA-C  finasteride (PROSCAR) 5 MG tablet Take 1 tablet by mouth daily 11/25/16  Yes Dena Billet B, PA-C  terazosin (HYTRIN) 10 MG capsule Take 1 capsule by mouth daily  HS 11/25/16  Yes Orlena Sheldon, PA-C     Allergies:     Allergies  Allergen Reactions  . Amlodipine Besy-Benazepril Hcl     Jittery, gets confused.  . Lipitor [Atorvastatin Calcium]     Weakness.  . Zocor [Simvastatin]   . Bactrim [Sulfamethoxazole-Trimethoprim] Rash  .  Pravachol Hives and Rash     Physical Exam:   Vitals  Blood pressure 121/65, pulse 79, temperature 98 F (36.7 C), temperature source Oral, resp. rate 11, SpO2 99 %.   1. General well-developed male lying in bed in NAD,    2. Normal affect and insight, Not Suicidal or Homicidal, Awake Alert, Oriented X 3.  3. No F.N deficits, ALL C.Nerves Intact, Strength 5/5 all 4 extremities, Sensation intact all 4 extremities, Plantars down going.  4. Ears and Eyes appear Normal, Conjunctivae clear, PERRLA. Moist Oral Mucosa.  5. Supple Neck, No JVD, No cervical lymphadenopathy appriciated, No Carotid Bruits.  6. Symmetrical Chest wall movement, Good air movement bilaterally, CTAB.  7. RRR, No Gallops, Rubs or Murmurs, No Parasternal Heave.  8. Positive Bowel Sounds, Abdomen Soft, No tenderness, No organomegaly appriciated,No rebound -guarding or rigidity.  9.  No Cyanosis, Normal Skin Turgor, No Skin Rash or Bruise.  10. Good muscle tone,  joints appear normal , no effusions, Normal ROM.  11. No Palpable Lymph Nodes in Neck or Axillae    Data Review:    CBC Recent Labs  Lab 01/27/18 1157 01/27/18 1548  WBC 7.8 8.1  HGB 11.5* 11.1*  HCT 38.0* 35.2*  PLT 256 243  MCV 88.0 88.2  MCH 26.6 27.8  MCHC 30.3 31.5  RDW 13.5 13.4   ------------------------------------------------------------------------------------------------------------------  Chemistries  Recent Labs  Lab 01/27/18 1157 01/27/18 1548  NA 140  --   K 3.7  --   CL 106  --   CO2 27  --   GLUCOSE 117*  --   BUN 12  --   CREATININE 1.08  --   CALCIUM 9.9  --   AST  --  21  ALT  --  27  ALKPHOS  --  36*  BILITOT  --  0.5   ------------------------------------------------------------------------------------------------------------------ CrCl cannot be calculated (Unknown ideal weight.). ------------------------------------------------------------------------------------------------------------------ No  results for input(s): TSH, T4TOTAL, T3FREE, THYROIDAB in the last 72 hours.  Invalid input(s): FREET3  Coagulation profile No results for input(s): INR, PROTIME in the last 168 hours. ------------------------------------------------------------------------------------------------------------------- No results for input(s): DDIMER in the last 72 hours. -------------------------------------------------------------------------------------------------------------------  Cardiac Enzymes Recent Labs  Lab 01/27/18 1548  TROPONINI <0.03   ------------------------------------------------------------------------------------------------------------------  No results found for: BNP   ---------------------------------------------------------------------------------------------------------------  Urinalysis    Component Value Date/Time   COLORURINE YELLOW 02/22/2014 [place] 02/22/2014 1215   LABSPEC 1.015 02/22/2014 1215   PHURINE 6.5 02/22/2014 1215   GLUCOSEU NEG 02/22/2014 1215   HGBUR SMALL (A) 02/22/2014 1215   BILIRUBINUR NEG 02/22/2014 1215   KETONESUR NEG 02/22/2014 1215   PROTEINUR NEG 02/22/2014 1215   UROBILINOGEN 0.2 02/22/2014 1215   NITRITE NEG 02/22/2014 1215   LEUKOCYTESUR TRACE (A) 02/22/2014 1215    ----------------------------------------------------------------------------------------------------------------   Imaging Results:    Dg Chest 2 View  Result Date: 01/27/2018 CLINICAL DATA:  Chest pain. EXAM: CHEST - 2 VIEW COMPARISON:  12/10/2002 report. FINDINGS: Mediastinum and hilar structures normal. Lungs are clear. No pleural effusion or pneumothorax. EKG leads noted over the chest. Degenerative change thoracic spine. Mild thoracic spine scoliosis. IMPRESSION: No acute cardiopulmonary disease. Electronically Signed   By: Marcello Moores  Register   On: 01/27/2018 12:33    My personal review of EKG: Rhythm NSR, Rate  92 /min, QTc 452 , no baseline EKG to  compare, has left fascicular block   Assessment & Plan:    Active Problems:   Hypertension   Hyperlipidemia   Chest pain   Chest pain -Resents with chest pain, relieved by nitro, cardiac history in the past, he will be admitted for observation, serial full dose aspirin, currently chest pain-free, will cycle troponins, monitor on telemetry, continue with aspirin, statin, will check lipid panel, Cardiology were consulted to see if is appropriate to obtain stress test during hospital stay.  Hypertension -Pressure acceptable, continue with home medication  Hyperlipedemia -Check lipid panel, continue with home dose statin   DVT Prophylaxis Heparin SCDs   AM Labs Ordered, also please review Full Orders  Family Communication: Admission, patients condition and plan of care including tests being ordered have been discussed with the patient and wife who indicate understanding and agree with the plan and Code Status.  Code Status Full  Likely DC to  Home  Condition GUARDED   Consults called: Cardiology  Admission status: Observation  Time spent in minutes :45 minutes   Phillips Climes M.D on 01/27/2018 at 5:08 PM  Between 7am to 7pm - Pager - [phone]. After 7pm go to [email or web address] - password [email or web address]  [facility] - Office  [phone]

## 2018-01-28 ENCOUNTER — Observation Stay (HOSPITAL_BASED_OUTPATIENT_CLINIC_OR_DEPARTMENT_OTHER): Payer: Medicare Other

## 2018-01-28 DIAGNOSIS — I10 Essential (primary) hypertension: Secondary | ICD-10-CM | POA: Diagnosis not present

## 2018-01-28 DIAGNOSIS — R079 Chest pain, unspecified: Secondary | ICD-10-CM | POA: Diagnosis not present

## 2018-01-28 DIAGNOSIS — I441 Atrioventricular block, second degree: Secondary | ICD-10-CM | POA: Diagnosis not present

## 2018-01-28 LAB — LIPID PANEL
Cholesterol: 147 mg/dL (ref 0–200)
HDL: 31 mg/dL — ABNORMAL LOW (ref >40)
LDL Cholesterol: 98 mg/dL (ref 0–99)
Total CHOL/HDL Ratio: 4.7 RATIO
Triglycerides: 90 mg/dL (ref <150)
VLDL: 18 mg/dL (ref 0–40)

## 2018-01-28 LAB — ECHOCARDIOGRAM COMPLETE
HEIGHTINCHES: 66 in
WEIGHTICAEL: 3255.75 [oz_av]

## 2018-01-28 NOTE — Progress Notes (Signed)
  Echocardiogram 2D Echocardiogram has been performed.  Patrick Villa 01/28/2018, 2:14 PM

## 2018-01-28 NOTE — Progress Notes (Signed)
Patient ID: KYLE LUPPINO, male   DOB: 06-08-1933, 85 y.o.   MRN: [id number]   Progress Note  Patient Name: Patrick Villa Date of Encounter: 01/28/2018  Primary Cardiologist: No primary care provider on file.   Subjective   No further chest pain.  Troponin negative.   Inpatient Medications    Scheduled Meds: . aspirin  325 mg Oral Daily  . benazepril  20 mg Oral Daily   And  . hydrochlorothiazide  25 mg Oral Daily  . cholecalciferol  1,000 Units Oral Daily  . finasteride  5 mg Oral Daily  . heparin  5,000 Units Subcutaneous Q8H  .  morphine injection  4 mg Intravenous Once  . terazosin  10 mg Oral QHS   Continuous Infusions:  PRN Meds: acetaminophen, nitroGLYCERIN, ondansetron (ZOFRAN) IV   Vital Signs    Vitals:   01/27/18 1951 01/27/18 2224 01/28/18 0009 01/28/18 0635  BP: 133/73  (!) 143/74 (!) 170/89  Pulse: 66  70 83  Resp: 17  16 17   Temp: 98 F (36.7 C)  98.4 F (36.9 C) 98.6 F (37 C)  TempSrc: Oral  Oral Oral  SpO2: 100%  98% 98%  Weight:  92.3 kg    Height:  5\' 6"  (1.676 m)     No intake or output data in the 24 hours ending 01/28/18 1316 Filed Weights   01/27/18 2224  Weight: 92.3 kg    Telemetry    Episodes of type 1 2nd degree AVB - Personally Reviewed   Physical Exam   GEN: No acute distress.   Neck: No JVD Cardiac: RRR, no murmurs, rubs, or gallops.  Respiratory: Clear to auscultation bilaterally. GI: Soft, nontender, non-distended  MS: No edema; No deformity. Neuro:  Nonfocal  Psych: Normal affect   Labs    Chemistry Recent Labs  Lab 01/27/18 1157 01/27/18 1548  NA 140  --   K 3.7  --   CL 106  --   CO2 27  --   GLUCOSE 117*  --   BUN 12  --   CREATININE 1.08 0.96  CALCIUM 9.9  --   PROT  --  6.9  ALBUMIN  --  3.6  AST  --  21  ALT  --  27  ALKPHOS  --  36*  BILITOT  --  0.5  GFRNONAA >60 >60  GFRAA >60 >60  ANIONGAP 7  --      Hematology Recent Labs  Lab 01/27/18 1157 01/27/18 1548  WBC 7.8 8.1  RBC  4.32 3.99*  HGB 11.5* 11.1*  HCT 38.0* 35.2*  MCV 88.0 88.2  MCH 26.6 27.8  MCHC 30.3 31.5  RDW 13.5 13.4  PLT 256 243    Cardiac Enzymes Recent Labs  Lab 01/27/18 1548 01/27/18 1823 01/27/18 2140  TROPONINI <0.03 <0.03 <0.03    Recent Labs  Lab 01/27/18 1204  TROPIPOC 0.02     BNPNo results for input(s): BNP, PROBNP in the last 168 hours.   DDimer No results for input(s): DDIMER in the last 168 hours.   Radiology    Dg Chest 2 View  Result Date: 01/27/2018 CLINICAL DATA:  Chest pain. EXAM: CHEST - 2 VIEW COMPARISON:  12/10/2002 report. FINDINGS: Mediastinum and hilar structures normal. Lungs are clear. No pleural effusion or pneumothorax. EKG leads noted over the chest. Degenerative change thoracic spine. Mild thoracic spine scoliosis. IMPRESSION: No acute cardiopulmonary disease. Electronically Signed   By: Marcello Moores  Register   On:  01/27/2018 12:33     Assessment & Plan    1. Chest pain: Atypical.  Troponin negative.  ECG with no acute changes.  - Reasonable to get echocardiogram.  - Think he is safe for discharge and cardiology followup for outpatient Cardiolite.  2. HTN: May need addition of another agent off diltiazem.  3. Type 1 2nd degree AVB: This is the only arrhythmia noted on telemetry overnight.  No significant bradycardia.  - No indication for pacemaker.  - Would avoid nodal blocking agents.   [facility] will sign off.   Medication Recommendations:  Stop diltiazem.  Other recommendations (labs, testing, etc):  Can followup with cardiology for outpatient stress test.  Follow up as an outpatient:  [facility] cardiology For questions or updates, please contact [place] Please consult [email or web address] for contact info under     Signed, Loralie Champagne, MD  01/28/2018, 1:16 PM

## 2018-01-30 NOTE — Progress Notes (Signed)
Physician Discharge Summary  QUINNTIN MALTER NKN:[id number] DOB: 06-21-1933 DOA: 01/27/2018  PCP: Alycia Rossetti, MD  Admit date: 01/27/2018 Discharge date: 01/28/2018  Admitted From: home Discharge disposition: home   Recommendations for Outpatient Follow-Up:   1. Outpatient cardiology follow up for stress test 2. Titrate BP medications avoiding nodal blocking agents   Discharge Diagnosis:   Active Problems:   Hypertension   Hyperlipidemia   Chest pain    Discharge Condition: Improved.  Diet recommendation: Low sodium, heart healthy  Wound care: None.  Code status: Full.   History of Present Illness:   Patrick Villa  is a 85 y.o. 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 -In ED troponins were negative, he is currently chest pain-free, no significant lab abnormalities, no previous EKG to compare, EKG in ED was significant for left fascicular block, he received full dose aspirin, I was called to admit.   Hospital Course by Problem:    Chest pain: Atypical.  Troponin negative.  ECG with no acute changes.  - echocardiogram: EF normal  - outpatient Cardiolite.   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  No significant bradycardia.  - No indication for pacemaker.  - Would avoid nodal blocking agents.     Medical Consultants:     cards  Discharge Exam:   Vitals:   01/28/18 0635 01/28/18 1528  BP: (!) 170/89 (!) 159/94  Pulse: 83 90  Resp: 17 18  Temp: 98.6 F (37 C) 98.3 F (36.8 C)  SpO2: 98% 96%   Vitals:   01/27/18 2224 01/28/18 0009 01/28/18 0635 01/28/18 1528  BP:  (!) 143/74 (!) 170/89 (!) 159/94  Pulse:  70 83 90  Resp:  16 17 18   Temp:  98.4 F (36.9 C) 98.6 F (37 C) 98.3 F (36.8 C)  TempSrc:  Oral Oral Oral  SpO2:  98% 98% 96%  Weight: 92.3 kg     Height: 5\' 6"  (1.676 m)       General exam: dressed and ready to go, no complaints  The results of significant diagnostics from this hospitalization (including imaging, microbiology, ancillary and laboratory) are listed below for reference.     Procedures and Diagnostic Studies:   Dg Chest 2 View  Result Date: 01/27/2018 CLINICAL DATA:  Chest pain. EXAM: CHEST - 2 VIEW COMPARISON:  12/10/2002 report. FINDINGS: Mediastinum and hilar structures normal. Lungs are clear. No pleural effusion or pneumothorax. EKG leads noted over the chest. Degenerative change thoracic spine. Mild thoracic spine scoliosis. IMPRESSION: No acute cardiopulmonary disease. Electronically Signed   By: Marcello Moores  Register   On: 01/27/2018 12:33     Labs:   Basic Metabolic Panel: Recent Labs  Lab 01/27/18 1157 01/27/18 1548  NA 140  --   K 3.7  --   CL 106  --   CO2 27  --   GLUCOSE 117*  --  BUN 12  --   CREATININE 1.08 0.96  CALCIUM 9.9  --    GFR Estimated Creatinine Clearance: 63.1 mL/min (by C-G formula based on SCr of 0.96 mg/dL). Liver Function Tests: Recent Labs  Lab 01/27/18 1548  AST 21  ALT 27  ALKPHOS 36*  BILITOT 0.5  PROT 6.9  ALBUMIN 3.6   Recent Labs  Lab 01/27/18 1548  LIPASE 19   No results for input(s): AMMONIA in the last 168 hours. Coagulation profile No results for input(s): INR, PROTIME in the last 168 hours.  CBC: Recent Labs  Lab 01/27/18 1157 01/27/18 1548  WBC 7.8 8.1  HGB 11.5* 11.1*  HCT 38.0* 35.2*   MCV 88.0 88.2  PLT 256 243   Cardiac Enzymes: Recent Labs  Lab 01/27/18 1548 01/27/18 1823 01/27/18 2140  TROPONINI <0.03 <0.03 <0.03   BNP: Invalid input(s): POCBNP CBG: No results for input(s): GLUCAP in the last 168 hours. D-Dimer No results for input(s): DDIMER in the last 72 hours. Hgb A1c No results for input(s): HGBA1C in the last 72 hours. Lipid Profile Recent Labs    01/28/18 0538  CHOL 147  HDL 31*  LDLCALC 98  TRIG 90  CHOLHDL 4.7   Thyroid function studies No results for input(s): TSH, T4TOTAL, T3FREE, THYROIDAB in the last 72 hours.  Invalid input(s): FREET3 Anemia work up No results for input(s): VITAMINB12, FOLATE, FERRITIN, TIBC, IRON, RETICCTPCT in the last 72 hours. Microbiology No results found for this or any previous visit (from the past 240 hour(s)).   Discharge Instructions:   Discharge Instructions    Diet - low sodium heart healthy   Complete by:  As directed    Increase activity slowly   Complete by:  As directed      Allergies as of 01/28/2018      Reactions   Amlodipine Besy-benazepril Hcl    Jittery, gets confused.   Lipitor [atorvastatin Calcium]    Weakness.   Zocor [simvastatin]    Bactrim [sulfamethoxazole-trimethoprim] Rash   Pravachol Hives, Rash      Medication List    STOP taking these medications   diltiazem 90 MG tablet Commonly known as:  CARDIZEM     TAKE these medications   aspirin 325 MG tablet Take 325 mg by mouth daily.   benazepril-hydrochlorthiazide 20-25 MG tablet Commonly known as:  LOTENSIN HCT TAKE 1 TABLET BY MOUTH  DAILY   carboxymethylcellulose 0.5 % Soln Commonly known as:  REFRESH PLUS Place 1 drop into both eyes as needed (dry eyes).   finasteride 5 MG tablet Commonly known as:  PROSCAR Take 1 tablet by mouth daily   terazosin 10 MG capsule Commonly known as:  HYTRIN Take 1 capsule by mouth daily  HS   Vitamin D 50 MCG (2000 UT) Caps Take 1 capsule (2,000 Units total) by  mouth daily.      Follow-up Information    [place], Modena Nunnery, MD Follow up in 1 week(s).   Specialty:  Family Medicine Why:  may need additional BP agent-- avoiding rate limiting medications Contact information: [place] [address] [place] [address] [phone]            Time coordinating discharge: 25 min  Signed:  Geradine Girt DO  [facility] 01/30/2018, 4:17 PM

## 2018-01-30 NOTE — Discharge Summary (Signed)
D/c summary is under progress note from 1/4. JV

## 2018-02-07 ENCOUNTER — Other Ambulatory Visit: Payer: Self-pay

## 2018-02-07 ENCOUNTER — Ambulatory Visit (INDEPENDENT_AMBULATORY_CARE_PROVIDER_SITE_OTHER): Payer: Medicare Other | Admitting: Family Medicine

## 2018-02-07 ENCOUNTER — Encounter: Payer: Self-pay | Admitting: Family Medicine

## 2018-02-07 VITALS — BP 170/88 | HR 80 | Temp 98.5°F | Resp 14 | Ht 66.0 in | Wt 203.0 lb

## 2018-02-07 DIAGNOSIS — I441 Atrioventricular block, second degree: Secondary | ICD-10-CM

## 2018-02-07 DIAGNOSIS — I10 Essential (primary) hypertension: Secondary | ICD-10-CM | POA: Diagnosis not present

## 2018-02-07 DIAGNOSIS — E7800 Pure hypercholesterolemia, unspecified: Secondary | ICD-10-CM

## 2018-02-07 DIAGNOSIS — R202 Paresthesia of skin: Secondary | ICD-10-CM

## 2018-02-07 NOTE — Progress Notes (Signed)
Subjective:    Patient ID: Patrick Villa, male    DOB: 08/25/1933, 85 y.o.   MRN: [id number]  Patient presents for Hospital F/U (chest pain- cardizem was restricting blood flow, so medication was stopped) and Foot Pain (has some irtiating tingles to calfs and bottom of feet- states that he also has some balance issues)  Pt here for hospital follow up.  Was admitted to the hospital after he had episode of chest tightness.  He was found to have AV nodal block he was taken off of the diltiazem.  It was recommended that he have outpatient stress testing Cardiolite.  Echo was normal.  He did give him nitroglycerin while in the hospital and that did relieve his chest pressure. No further chest pain, stays active outside     BP at home 130/70-80's   Tingling in both feet and balance was a litle off. He went to a wedding 2 weeks ago, and stood for a very long period and afterwards had symptoms. He still has some tingling but balance is better No diabetes , no numbness  In the past seen by Dr. Joya Salm  Has some arthritis in spine, no radicular symptoms Chiropracter- [place] - does adjustments for his arthritis   BPH he is following with urology.  He has Proscar and terazosin   Review Of Systems:  GEN- denies fatigue, fever, weight loss,weakness, recent illness HEENT- denies eye drainage, change in vision, nasal discharge, CVS- denies chest pain, palpitations RESP- denies SOB, cough, wheeze ABD- denies N/V, change in stools, abd pain GU- denies dysuria, hematuria, dribbling, incontinence MSK- denies joint pain, muscle aches, injury Neuro- denies headache, dizziness, syncope, seizure activity       Objective:    BP (!) 170/88   Pulse 80   Temp 98.5 F (36.9 C) (Oral)   Resp 14   Ht 5\' 6"  (1.676 m)   Wt 203 lb (92.1 kg)   SpO2 99%   BMI 32.77 kg/m  GEN- NAD, alert and oriented x3 HEENT- PERRL, EOMI, non injected sclera, pink conjunctiva, MMM, oropharynx clear CVS- RRR, no  murmur RESP-CTAB ABD-NABS,soft,NT,ND EXT- No edema Neuro- normal Monofilament, strength equal bilat LE, sensation in tact bilat  Pulses- Radial, DP- 2+        Assessment & Plan:      Problem List Items Addressed This Visit      Unprioritized   Hyperlipidemia    Cholesterol is diet controlled.  He will be referred to cardiology for the Mobitz type I and the need for outpatient stress testing based on their recommendations.      Relevant Orders   Ambulatory referral to Cardiology   Hypertension - Primary    Pressure elevated today however he states that he was worked up before he came in for this visit.  At home his blood pressures have looked good.  We will have him continue to monitor.  Will refer him to cardiology.  He is going to call me if his blood pressures are reaching 326 systolic at home will start him on amlodipine 5 mg.      Relevant Orders   Ambulatory referral to Cardiology    Other Visit Diagnoses    Mobitz type 1 second degree AV block       Relevant Orders   Ambulatory referral to Cardiology   Paresthesia of both feet       Normal examination sensation grossly intact his balance has improved his symptoms are actually going  away by themselves.  We will just monitor at this time no r      Note: This dictation was prepared with Dragon dictation along with smaller phrase technology. Any transcriptional errors that result from this process are unintentional.

## 2018-02-07 NOTE — Assessment & Plan Note (Signed)
Cholesterol is diet controlled.  He will be referred to cardiology for the Mobitz type I and the need for outpatient stress testing based on their recommendations.

## 2018-02-07 NOTE — Assessment & Plan Note (Signed)
Pressure elevated today however he states that he was worked up before he came in for this visit.  At home his blood pressures have looked good.  We will have him continue to monitor.  Will refer him to cardiology.  He is going to call me if his blood pressures are reaching 644 systolic at home will start him on amlodipine 5 mg.

## 2018-02-07 NOTE — Patient Instructions (Addendum)
Referral to cardiology  Call if blood pressure at home is > 450'T systolic F/U 6 months for Physical

## 2018-02-13 ENCOUNTER — Telehealth: Payer: Self-pay | Admitting: Family Medicine

## 2018-02-13 NOTE — Telephone Encounter (Signed)
[phone] Patient is calling to say that his blood pressure is still running high  Would like a call back regarding what he should do

## 2018-02-13 NOTE — Telephone Encounter (Signed)
Call placed to patient to inquire. LMTRC. 

## 2018-02-17 NOTE — Telephone Encounter (Signed)
Pt called inquiring about bp again states he still has not heard from [place] please call him back.

## 2018-02-20 ENCOUNTER — Ambulatory Visit: Payer: Medicare Other | Admitting: Cardiology

## 2018-02-20 ENCOUNTER — Encounter: Payer: Self-pay | Admitting: Cardiology

## 2018-02-20 ENCOUNTER — Ambulatory Visit: Payer: Self-pay | Admitting: Interventional Cardiology

## 2018-02-20 VITALS — BP 160/86 | Ht 66.0 in | Wt 203.6 lb

## 2018-02-20 DIAGNOSIS — R011 Cardiac murmur, unspecified: Secondary | ICD-10-CM | POA: Diagnosis not present

## 2018-02-20 DIAGNOSIS — R079 Chest pain, unspecified: Secondary | ICD-10-CM | POA: Diagnosis not present

## 2018-02-20 DIAGNOSIS — I10 Essential (primary) hypertension: Secondary | ICD-10-CM | POA: Diagnosis not present

## 2018-02-20 DIAGNOSIS — I441 Atrioventricular block, second degree: Secondary | ICD-10-CM | POA: Insufficient documentation

## 2018-02-20 MED ORDER — AMLODIPINE BESYLATE 5 MG PO TABS: 5.0000 mg | ORAL_TABLET | Freq: Every day | ORAL | 6 refills | Status: DC

## 2018-02-20 NOTE — Telephone Encounter (Signed)
Call placed to patient. LMTRC.  

## 2018-02-20 NOTE — Patient Instructions (Signed)
Medication Instructions:  Start: Amlodipine 5 mg daily If you need a refill on your cardiac medications before your next appointment, please call your pharmacy.   Lab work: None  Testing/Procedures: None  Follow-Up: At [facility], you and your health needs are our priority.  As part of our continuing mission to provide you with exceptional heart care, we have created designated Provider Care Teams.  These Care Teams include your primary Cardiologist (physician) and Advanced Practice Providers (APPs -  Physician Assistants and Nurse Practitioners) who all work together to provide you with the care you need, when you need it. You will need a follow up appointment in 6 months.  Please call our office 2 months in advance to schedule this appointment.  You may see Dr. Harrell Gave or one of the following Advanced Practice Providers on your designated Care Team:   Rosaria Ferries, PA-C . Jory Sims, DNP, ANP  Your physician recommends that you schedule a follow-up appointment in 3-4 weeks with [facility].

## 2018-02-20 NOTE — Progress Notes (Signed)
Cardiology Office Note:    Date:  02/20/2018   ID:  JADRIEN NARINE, DOB 03-20-1933, MRN [id number]  PCP:  Alycia Rossetti, MD  Cardiologist:  Buford Dresser, MD PhD  Referring MD: Alycia Rossetti, MD   CC: hospital follow up, hypertension, chest pain, abnormal rhythm  History of Present Illness:    Patrick Villa is a 85 y.o. male with a hx of COPD, hyperlipidemia, hypertension, second degree AV block type I who is seen as a new consult at the request of Buelah Manis, Modena Nunnery, MD for the evaluation and management of second degree, type I heart block.   He was seen in the hospital on 01/27/18 and 01/28/18 by Drs. Fredrik Rigger for bilateral breast tenderness. At that time, troponins were negative, echo was unremarkable. Cardizem was stopped due to second degree AV block, type I. He was recommended for outpatient chest pain evaluation.  Since that time, feels like his ribs are tight when he takes a deep breath. May be slightly better than when he was in the hospital, but not resolved. Also has what he describes as "skin tightness" across his bilateral breast area, though he says it is no longer tender to palpation. Has had similar symptoms in the distant past (>20 years ago), was told he had early ulcers.   Hypertension: home blood pressures have remained >607 systolic since returning home. Cardizem was stopped due to second degree type I AV block. We reviewed options for medications: no diltiazem/verapamil or beta blocker due to AV block, no MRA due to breast/chest wall tenderness. Options would be to go up on benazepril dose or re-trial amlodipine. He has a listed side effect to amlodipine, but he does not recall the symptoms of jitteriness/confusion. He wonders if it was a sexual side effect. He does not remember it being a major issue, and he is willing to re-trial it. Counseled on LE edema effect.  No syncope/LOC. Reviewed his ECG with him.    Past Medical History:  Diagnosis Date    . Colon polyps   . COPD (chronic obstructive pulmonary disease) ([place])   . Echocardiogram abnormal   . Ganglion cyst   . Hematuria   . Hyperlipidemia   . Hypertension   . Systolic murmur     Past Surgical History:  Procedure Laterality Date  . COLON SURGERY    . TRANSTHORACIC ECHOCARDIOGRAM      Current Medications: Current Outpatient Medications on File Prior to Visit  Medication Sig  . aspirin 325 MG tablet Take 325 mg by mouth daily.    . benazepril-hydrochlorthiazide (LOTENSIN HCT) 20-25 MG tablet TAKE 1 TABLET BY MOUTH  DAILY  . Cholecalciferol (VITAMIN D) 2000 UNITS CAPS Take 1 capsule (2,000 Units total) by mouth daily.  . finasteride (PROSCAR) 5 MG tablet Take 1 tablet by mouth daily  . terazosin (HYTRIN) 10 MG capsule Take 1 capsule by mouth daily  HS   No current facility-administered medications on file prior to visit.      Allergies:   Amlodipine besy-benazepril hcl; Lipitor [atorvastatin calcium]; Zocor [simvastatin]; Bactrim [sulfamethoxazole-trimethoprim]; and Pravachol   Social History   Socioeconomic History  . Marital status: Widowed    Spouse name: Not on file  . Number of children: Not on file  . Years of education: Not on file  . Highest education level: Not on file  Occupational History  . Not on file  Social Needs  . Financial resource strain: Not on file  .  Food insecurity:    Worry: Not on file    Inability: Not on file  . Transportation needs:    Medical: Not on file    Non-medical: Not on file  Tobacco Use  . Smoking status: Former Smoker    Last attempt to quit: 10/18/1981    Years since quitting: 36.3  . Smokeless tobacco: Never Used  Substance and Sexual Activity  . Alcohol use: No  . Drug use: No  . Sexual activity: Not on file  Lifestyle  . Physical activity:    Days per week: Not on file    Minutes per session: Not on file  . Stress: Not on file  Relationships  . Social connections:    Talks on phone: Not on file     Gets together: Not on file    Attends religious service: Not on file    Active member of club or organization: Not on file    Attends meetings of clubs or organizations: Not on file    Relationship status: Not on file  Other Topics Concern  . 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   No other exercise   Plays guitar.   Separated. Lives alone.     Family History: The patient's family history includes Diabetes in his mother; Stroke in his brother.  ROS:   Please see the history of present illness.  Additional pertinent ROS:  Constitutional: Negative for chills, fever, night sweats, unintentional weight loss  HENT: Negative for ear pain and hearing loss.   Eyes: Negative for loss of vision and eye pain.  Respiratory: Negative for cough, sputum, shortness of breath, wheezing.   Cardiovascular: Positive for chest discomfort. Negative for palpitations, PND, orthopnea, lower extremity edema and claudication.  Gastrointestinal: Negative for abdominal pain, melena, and hematochezia.  Genitourinary: Negative for dysuria and hematuria.  Musculoskeletal: Negative for falls and myalgias.  Skin: Negative for itching and rash.  Neurological: Negative for focal weakness, focal sensory changes and loss of consciousness.  Endo/Heme/Allergies: Does not bruise/bleed easily.    EKGs/Labs/Other Studies Reviewed:    The following studies were reviewed today: Echo 01/28/18 - Left ventricle: The cavity size was normal. Wall thickness was   normal. Systolic function was normal. The estimated ejection   fraction was in the range of 55% to 60%. Wall motion was normal;   there were no regional wall motion abnormalities. The study is   not technically sufficient to allow evaluation of LV diastolic   function. - Atrial septum: There was an atrial septal aneurysm.  Impressions:  - Normal LV function; sclerotic aortic valve.  EKG:  EKG is  personally reviewed.  The ekg ordered today demonstrates sinus rhythm with 2nd degree AV block type I (Wenkebach)  Recent Labs: 01/27/2018: ALT 27; BUN 12; Creatinine, Ser 0.96; Hemoglobin 11.1; Platelets 243; Potassium 3.7; Sodium 140  Recent Lipid Panel    Component Value Date/Time   CHOL 147 01/28/2018 0538   TRIG 90 01/28/2018 0538   HDL 31 (L) 01/28/2018 0538   CHOLHDL 4.7 01/28/2018 0538   VLDL 18 01/28/2018 0538   LDLCALC 98 01/28/2018 0538    Physical Exam:    VS:  BP (!) 160/86 (BP Location: Left Arm)   Ht 5\' 6"  (1.676 m)   Wt 203 lb 9.6 oz (92.4 kg)   BMI 32.86 kg/m     Wt Readings from Last 3 Encounters:  02/20/18 203 lb  9.6 oz (92.4 kg)  02/07/18 203 lb (92.1 kg)  01/27/18 203 lb 7.8 oz (92.3 kg)     GEN: Well nourished, well developed in no acute distress HEENT: Normal NECK: No JVD; No carotid bruits LYMPHATICS: No lymphadenopathy CARDIAC: regular rhythm, normal S1 and S2, no rubs, gallops.1/6 SEM radiating to carotids. Radial and DP pulses 2+ bilaterally. RESPIRATORY:  Clear to auscultation without rales, wheezing or rhonchi  ABDOMEN: Soft, non-tender, non-distended MUSCULOSKELETAL:  Trace bilateral LE edema; No deformity  SKIN: Warm and dry NEUROLOGIC:  Alert and oriented PSYCHIATRIC:  Normal affect   ASSESSMENT:    1. Chest pain, unspecified type   2. Essential hypertension   3. Systolic murmur   4. Second degree AV block, Mobitz type I    PLAN:    1. Chest discomfort: atypical symptoms. Can walk on a treadmill, no diabetes. However, with his baseline elevated blood pressure (some recent in the 170s), do not want to exercise him and get his blood pressure very highly elevated -will plan to optimize BP control first -once BP improved, plan for exercise treadmill test for chest pain. Will also be helpful to see his HR response -instructed on red flag symptoms that warrant emergency medical attention  2. Hypertension  -will trial amlodipine 5 mg  starting today (he is unsure what prior reaction was, but not true allergy)  -PharmD hypertension clinic in 3-4 weeks. Can either uptitrate amlodipine if tolerating or increase benazepril portion of combo dose for better response  -no diltiazem/verapamil, beta blocker due to AV block  -with breast tenderness, would avoid MRA for gynecomastia side effects  3. Murmur: aortic sclerosis without stenosis, no further workup needed. Echo 01/2018  4. Wenkebach (second degree AV block, type I): no high risk symptoms. No indication for pacemaker. Avoid AV nodal agents. Will see HR response on treadmill study.  Plan for follow up: HTN clinic 3-4 weeks, PCP in April, me in 6 mos  TIME SPENT WITH PATIENT: 35 minutes of direct patient care. More than 50% of that time was spent on coordination of care and counseling regarding symptoms and management.  Buford Dresser, MD, PhD [place]  [facility]   Medication Adjustments/Labs and Tests Ordered: Current medicines are reviewed at length with the patient today.  Concerns regarding medicines are outlined above.  Orders Placed This Encounter  Procedures  . EKG 12-Lead   Meds ordered this encounter  Medications  . amLODipine (NORVASC) 5 MG tablet    Sig: Take 1 tablet (5 mg total) by mouth daily.    Dispense:  30 tablet    Refill:  6    Patient Instructions  Medication Instructions:  Start: Amlodipine 5 mg daily If you need a refill on your cardiac medications before your next appointment, please call your pharmacy.   Lab work: None  Testing/Procedures: None  Follow-Up: At [facility], you and your health needs are our priority.  As part of our continuing mission to provide you with exceptional heart care, we have created designated Provider Care Teams.  These Care Teams include your primary Cardiologist (physician) and Advanced Practice Providers (APPs -  Physician Assistants and Nurse Practitioners) who all work together to  provide you with the care you need, when you need it. You will need a follow up appointment in 6 months.  Please call our office 2 months in advance to schedule this appointment.  You may see Dr. Harrell Gave or one of the following Advanced Practice Providers on your  designated Care Team:   Rosaria Ferries, PA-C . Jory Sims, DNP, ANP  Your physician recommends that you schedule a follow-up appointment in 3-4 weeks with Hypertension Clinic.       Signed, Buford Dresser, MD PhD 02/20/2018 2:57 PM    [facility]

## 2018-02-21 NOTE — Telephone Encounter (Signed)
Multiple calls placed to patient with no answer and no return call.   Message to be closed.  

## 2018-02-21 NOTE — Telephone Encounter (Signed)
Call placed to patient. LMTRC.  

## 2018-03-02 ENCOUNTER — Encounter: Payer: Self-pay | Admitting: Family Medicine

## 2018-03-14 ENCOUNTER — Encounter (INDEPENDENT_AMBULATORY_CARE_PROVIDER_SITE_OTHER): Payer: Self-pay

## 2018-03-14 ENCOUNTER — Ambulatory Visit: Payer: Medicare Other | Admitting: Pharmacist Clinician (PhC)/ Clinical Pharmacy Specialist

## 2018-03-14 VITALS — BP 156/72 | HR 80 | Resp 16 | Ht 66.0 in | Wt 204.2 lb

## 2018-03-14 DIAGNOSIS — I10 Essential (primary) hypertension: Secondary | ICD-10-CM | POA: Diagnosis not present

## 2018-03-14 MED ORDER — BENAZEPRIL HCL 20 MG PO TABS: 20.0000 mg | ORAL_TABLET | Freq: Every day | ORAL | 3 refills | Status: DC

## 2018-03-14 NOTE — Progress Notes (Signed)
03/14/2018 Patrick Villa 06-10-1933 [id number]   HPI:  MELVEN STOCKARD is a 85 y.o. male patient of Dr Harrell Gave, with a [place] below who presents today for hypertension clinic evaluation.  In addition to hypertension his medical history is significant for COPD, hyperlipidemia, and second degree AV block (type 1).  He was hospitalized overnight in January for chest pain, which was when the AV block was noted.  He had been on diltiazem for his blood pressure, but that was discontinued on discharge.    He saw Dr. Harrell Gave after discharge, on 1-27 and was noted to have a blood pressure of 160/86.  His home blood pressure readings were mostly in the same range, so she added amlodipine 5 mg daily.  He returns today for follow up.  He notes that he took the amlodipine for only about 4-5 days, but had to stop due to jitteriness, feeling off balance, and tremors.  The symptoms resolved within 48 hours of discontinuation.  He had previously complained about chest wall pain, but is feeling fine today.   Blood Pressure Goal:  130/80  Current Medications:  Benazepril hctz 20/25 mg qd  Terazosin 10 mg qhs  Family Hx:  Mother died at 75, "heart disease", hypertension, DM  Father died at 67  3 siblings, 1 sister with heart disease, 1 brother deceased (asthma)  1 daughter no health issues  Social Hx:  No tobacco, quit 40 years ago; occasional social alcohol; coffee occasionally, pepsi or mt dew couple of times per week.  Diet:  Mostly home cooked, occasional fried foods; mostly fresh or frozen veggies; nuts and popcorn; salsa and chips;   Exercise:  Always up and moving, takes care of yard, tinkers in garage all day, doesn't sit still much  Home BP readings:  Home readings between 149/67 and 169/72; home cuff 4-5 years. Arm cuff, not sure of brand  Intolerances:  CCB/BB - cannot take due to AV block  Spironolactone - avoid due to chest wall pain  Amlodipine - jitteriness, tremors, unstable  gait  Labs:  01/27/2018:  Na 140, K 3.7, Glu 117, BUN 12, SCr 1.08, GFR > 60 Wt Readings from Last 3 Encounters:  03/14/18 204 lb 3.2 oz (92.6 kg)  02/20/18 203 lb 9.6 oz (92.4 kg)  02/07/18 203 lb (92.1 kg)   BP Readings from Last 3 Encounters:  03/14/18 (!) 156/72  02/20/18 (!) 160/86  02/07/18 (!) 170/88   Pulse Readings from Last 3 Encounters:  03/14/18 80  02/07/18 80  01/28/18 90    Current Outpatient Medications  Medication Sig Dispense Refill  . aspirin 325 MG tablet Take 325 mg by mouth daily.      . benazepril-hydrochlorthiazide (LOTENSIN HCT) 20-25 MG tablet TAKE 1 TABLET BY MOUTH  DAILY 90 tablet 2  . Cholecalciferol (VITAMIN D) 2000 UNITS CAPS Take 1 capsule (2,000 Units total) by mouth daily. 30 capsule 11  . finasteride (PROSCAR) 5 MG tablet Take 1 tablet by mouth daily 90 tablet 11  . terazosin (HYTRIN) 10 MG capsule Take 1 capsule by mouth daily  HS 90 capsule 6  . benazepril (LOTENSIN) 20 MG tablet Take 1 tablet (20 mg total) by mouth daily. 30 tablet 3   No current facility-administered medications for this visit.     Allergies  Allergen Reactions  . Amlodipine Other (See Comments)    Patient developed jitteriness, tremors and unstable gait.  Same issues when re-challenged  . Lipitor [Atorvastatin Calcium]  Weakness.  . Zocor [Simvastatin]   . Bactrim [Sulfamethoxazole-Trimethoprim] Rash  . Pravachol Hives and Rash    Past Medical History:  Diagnosis Date  . Colon polyps   . COPD (chronic obstructive pulmonary disease) ([place])   . Echocardiogram abnormal   . Ganglion cyst   . Hematuria   . Hyperlipidemia   . Hypertension   . Systolic murmur     Blood pressure (!) 156/72, pulse 80, resp. rate 16, height 5\' 6"  (1.676 m), weight 204 lb 3.2 oz (92.6 kg).  Hypertension Patient with essential hypertension, currently not well controlled.  He now needs to avoid all calcium channel blockers and beta blockers.  Would recommend avoiding spironolactone  due to his prior chest wall pains. For now I am going to increase his benazepril to 40 mg daily.  He currently takes the combo tablet benazepril/hctz 20/25, so will add a 20 mg benazepril tablet to that.  Will see him back in [facility] in 3 weeks.  If his pressure is well controlled, we can switch him to the benazpril 40 mg and hctz 25 mg separate tablets.     Tommy Medal PharmD CPP [facility] [address] [place], [place] [address] [phone]

## 2018-03-14 NOTE — Patient Instructions (Addendum)
Return for a a follow up appointment in 3 weeks  Go to the lab in 10-14 days  Your blood pressure today is 156/72  Check your blood pressure at home daily and keep record of the readings.  Take your BP meds as follows:  Start benazepril 20 mg once daily - in addition to the benazepril/hctz 20/25 mg tablet you already take.  Continue with all other medications.   If you don't have enough of the benazepril/hctz to last 3 weeks, please call me so that we can change the prescription at your mail order pharmacy now.     Bring all of your meds, your BP cuff and your record of home blood pressures to your next appointment.  Exercise as you're able, try to walk approximately 30 minutes per day.  Keep salt intake to a minimum, especially watch canned and prepared boxed foods.  Eat more fresh fruits and vegetables and fewer canned items.  Avoid eating in fast food restaurants.    HOW TO TAKE YOUR BLOOD PRESSURE: . Rest 5 minutes before taking your blood pressure. .  Don't smoke or drink caffeinated beverages for at least 30 minutes before. . Take your blood pressure before (not after) you eat. . Sit comfortably with your back supported and both feet on the floor (don't cross your legs). . Elevate your arm to heart level on a table or a desk. . Use the proper sized cuff. It should fit smoothly and snugly around your bare upper arm. There should be enough room to slip a fingertip under the cuff. The bottom edge of the cuff should be 1 inch above the crease of the elbow. . Ideally, take 3 measurements at one sitting and record the average.

## 2018-03-14 NOTE — Assessment & Plan Note (Signed)
Patient with essential hypertension, currently not well controlled.  He now needs to avoid all calcium channel blockers and beta blockers.  Would recommend avoiding spironolactone due to his prior chest wall pains. For now I am going to increase his benazepril to 40 mg daily.  He currently takes the combo tablet benazepril/hctz 20/25, so will add a 20 mg benazepril tablet to that.  Will see him back in [facility] in 3 weeks.  If his pressure is well controlled, we can switch him to the benazpril 40 mg and hctz 25 mg separate tablets.

## 2018-04-04 ENCOUNTER — Ambulatory Visit (INDEPENDENT_AMBULATORY_CARE_PROVIDER_SITE_OTHER): Payer: Medicare Other | Admitting: Pharmacist Clinician (PhC)/ Clinical Pharmacy Specialist

## 2018-04-04 DIAGNOSIS — I10 Essential (primary) hypertension: Secondary | ICD-10-CM

## 2018-04-04 LAB — BASIC METABOLIC PANEL
BUN/Creatinine Ratio: 14 (ref 10–24)
BUN: 17 mg/dL (ref 8–27)
CO2: 22 mmol/L (ref 20–29)
Calcium: 10.2 mg/dL (ref 8.6–10.2)
Chloride: 101 mmol/L (ref 96–106)
Creatinine, Ser: 1.18 mg/dL (ref 0.76–1.27)
GFR calc Af Amer: 66 mL/min/{1.73_m2} (ref >59)
GFR calc non Af Amer: 57 mL/min/{1.73_m2} — ABNORMAL LOW (ref >59)
GLUCOSE: 108 mg/dL — AB (ref 65–99)
Potassium: 3.7 mmol/L (ref 3.5–5.2)
SODIUM: 142 mmol/L (ref 134–144)

## 2018-04-04 MED ORDER — HYDRALAZINE HCL 25 MG PO TABS: 25.0000 mg | ORAL_TABLET | Freq: Three times a day (TID) | ORAL | 3 refills | Status: DC

## 2018-04-04 NOTE — Patient Instructions (Signed)
Return for a a follow up appointment April 3  Go to the lab today  Your blood pressure today is 178/84  Check your blood pressure at home daily and keep record of the readings.  Take your BP meds as follows:   Add hydralazine 25 mg twice daily  Continue benazepril/hctz but stop the benazepril 20 mg tablet  Bring all of your meds, your BP cuff and your record of home blood pressures to your next appointment.  Exercise as you're able, try to walk approximately 30 minutes per day.  Keep salt intake to a minimum, especially watch canned and prepared boxed foods.  Eat more fresh fruits and vegetables and fewer canned items.  Avoid eating in fast food restaurants.    HOW TO TAKE YOUR BLOOD PRESSURE: . Rest 5 minutes before taking your blood pressure. .  Don't smoke or drink caffeinated beverages for at least 30 minutes before. . Take your blood pressure before (not after) you eat. . Sit comfortably with your back supported and both feet on the floor (don't cross your legs). . Elevate your arm to heart level on a table or a desk. . Use the proper sized cuff. It should fit smoothly and snugly around your bare upper arm. There should be enough room to slip a fingertip under the cuff. The bottom edge of the cuff should be 1 inch above the crease of the elbow. . Ideally, take 3 measurements at one sitting and record the average.

## 2018-04-04 NOTE — Progress Notes (Signed)
04/05/2018 KHAYREE DELELLIS 03-Mar-1933 [id number]   HPI:  Patrick Villa is a 85 y.o. male patient of Dr Harrell Gave, with a [place] below who presents today for hypertension clinic evaluation.  In addition to hypertension his medical history is significant for COPD, hyperlipidemia, and second degree AV block (type 1).  He was hospitalized overnight in January for chest pain, which was when the AV block was noted.  He had been on diltiazem for his blood pressure, but that was discontinued on discharge.    He saw Dr. Harrell Gave after discharge, on 1-27 and was noted to have a blood pressure of 160/86.  His home blood pressure readings were mostly in the same range, and she added amlodipine 5 mg daily.  He was only able to take this for 4-5 days before he developed jitteriness, feeling off balance, and tremors.  The symptoms resolved within 48 hours of discontinuation.  At his last visit with [facility] we added benazepril 20 mg to give him a total of benazepril 40 mg plus hydrochlorothiazide 25 mg daily.  Unfortunately this did nothing to improve home blood pressure readings and patient complained of worsening ED with the high dose of benazepril.    Blood Pressure Goal:  130/80  Current Medications:  Benazepril hctz 20/25 mg qd + benazepril 20 mg  Terazosin 10 mg qhs  Family Hx:  Mother died at 31, "heart disease", hypertension, DM  Father died at 58  3 siblings, 1 sister with heart disease, 1 brother deceased (asthma)  1 daughter no health issues  Social Hx:  No tobacco, quit 40 years ago; occasional social alcohol; coffee occasionally, pepsi or mt dew couple of times per week.  Diet:  Mostly home cooked, occasional fried foods; mostly fresh or frozen veggies; nuts and popcorn; salsa and chips;   Exercise:  Always up and moving, takes care of yard, tinkers in garage all day, doesn't sit still much  Home BP readings:  Home readings ranging from 140's to 180's with average systolic of about  119.  Slightly lower readings in the evenings, but not significantly so.    Intolerances:  CCB/BB - cannot take due to AV block  Spironolactone - avoid due to chest wall pain  Amlodipine - jitteriness, tremors, unstable gait, AV block  Labs:  01/27/2018:  Na 140, K 3.7, Glu 117, BUN 12, SCr 1.08, GFR > 60 Wt Readings from Last 3 Encounters:  04/04/18 201 lb 6.4 oz (91.4 kg)  03/14/18 204 lb 3.2 oz (92.6 kg)  02/20/18 203 lb 9.6 oz (92.4 kg)   BP Readings from Last 3 Encounters:  04/04/18 (!) 178/84  03/14/18 (!) 156/72  02/20/18 (!) 160/86   Pulse Readings from Last 3 Encounters:  04/04/18 76  03/14/18 80  02/07/18 80    Current Outpatient Medications  Medication Sig Dispense Refill  . aspirin 325 MG tablet Take 325 mg by mouth daily.      . benazepril (LOTENSIN) 20 MG tablet Take 1 tablet (20 mg total) by mouth daily. 30 tablet 3  . benazepril-hydrochlorthiazide (LOTENSIN HCT) 20-25 MG tablet TAKE 1 TABLET BY MOUTH  DAILY 90 tablet 2  . Cholecalciferol (VITAMIN D) 2000 UNITS CAPS Take 1 capsule (2,000 Units total) by mouth daily. 30 capsule 11  . finasteride (PROSCAR) 5 MG tablet Take 1 tablet by mouth daily 90 tablet 11  . terazosin (HYTRIN) 10 MG capsule Take 1 capsule by mouth daily  HS 90 capsule 6  .  hydrALAZINE (APRESOLINE) 25 MG tablet Take 1 tablet (25 mg total) by mouth 3 (three) times daily. 60 tablet 3   No current facility-administered medications for this visit.     Allergies  Allergen Reactions  . Amlodipine Other (See Comments)    Patient developed jitteriness, tremors and unstable gait.  Same issues when re-challenged  . Lipitor [Atorvastatin Calcium]     Weakness.  . Zocor [Simvastatin]   . Bactrim [Sulfamethoxazole-Trimethoprim] Rash  . Pravachol Hives and Rash    Past Medical History:  Diagnosis Date  . Colon polyps   . COPD (chronic obstructive pulmonary disease) ([place])   . Echocardiogram abnormal   . Ganglion cyst   . Hematuria   .  Hyperlipidemia   . Hypertension   . Systolic murmur     Blood pressure (!) 178/84, pulse 76, resp. rate 16, height 5\' 6"  (1.676 m), weight 201 lb 6.4 oz (91.4 kg), SpO2 97 %.  Hypertension Patient with essential hypertension, currently not well controlled.  Will add hydralazine 25 mg twice daily and have patient continue with regular home BP checks.  He will return to clinic in 3-4 weeks for follow up.  Did have patient go to lab today for metabolic panel due to previous increase in benazepril, even though we will cut him back to 20 mg today.  It was reviewed with patient that ED can worsen with some antihypertensive medications, but that it can also worsen with uncontrolled hypertension.     Tommy Medal PharmD CPP [facility] [address] [place], [place] [address] [phone]

## 2018-04-05 NOTE — Assessment & Plan Note (Signed)
Patient with essential hypertension, currently not well controlled.  Will add hydralazine 25 mg twice daily and have patient continue with regular home BP checks.  He will return to clinic in 3-4 weeks for follow up.  Did have patient go to lab today for metabolic panel due to previous increase in benazepril, even though we will cut him back to 20 mg today.  It was reviewed with patient that ED can worsen with some antihypertensive medications, but that it can also worsen with uncontrolled hypertension.

## 2018-04-25 ENCOUNTER — Telehealth: Payer: Self-pay | Admitting: Pharmacist

## 2018-04-25 NOTE — Telephone Encounter (Signed)
Will cancel appt for 4/2 due do COVID-19. Plan to reschdule as virtual of phone visit ASAP.   Patient denies problems with current therapy and reports improvement on BP readyings.  BP 140s to 150s. He understands this is not an idea readying but much improved from 170-180s in recent past.

## 2018-04-28 ENCOUNTER — Ambulatory Visit: Payer: Medicare Other

## 2018-05-04 ENCOUNTER — Encounter: Payer: Self-pay | Admitting: Physician Assistant

## 2018-05-05 ENCOUNTER — Encounter: Payer: Self-pay | Admitting: Family Medicine

## 2018-05-23 ENCOUNTER — Ambulatory Visit: Payer: Self-pay | Admitting: Family Medicine

## 2018-05-23 ENCOUNTER — Ambulatory Visit (INDEPENDENT_AMBULATORY_CARE_PROVIDER_SITE_OTHER): Payer: Medicare Other | Admitting: Family Medicine

## 2018-05-23 ENCOUNTER — Encounter: Payer: Self-pay | Admitting: Family Medicine

## 2018-05-23 ENCOUNTER — Other Ambulatory Visit: Payer: Self-pay

## 2018-05-23 DIAGNOSIS — E559 Vitamin D deficiency, unspecified: Secondary | ICD-10-CM

## 2018-05-23 DIAGNOSIS — N400 Enlarged prostate without lower urinary tract symptoms: Secondary | ICD-10-CM | POA: Diagnosis not present

## 2018-05-23 DIAGNOSIS — I10 Essential (primary) hypertension: Secondary | ICD-10-CM

## 2018-05-23 DIAGNOSIS — E7800 Pure hypercholesterolemia, unspecified: Secondary | ICD-10-CM | POA: Diagnosis not present

## 2018-05-23 MED ORDER — FINASTERIDE 5 MG PO TABS: ORAL_TABLET | ORAL | 3 refills | Status: DC

## 2018-05-23 MED ORDER — TERAZOSIN HCL 10 MG PO CAPS: ORAL_CAPSULE | ORAL | 3 refills | Status: DC

## 2018-05-23 MED ORDER — HYDRALAZINE HCL 25 MG PO TABS: 25.0000 mg | ORAL_TABLET | Freq: Three times a day (TID) | ORAL | 3 refills | Status: DC

## 2018-05-23 MED ORDER — BENAZEPRIL-HYDROCHLOROTHIAZIDE 20-25 MG PO TABS: 1.0000 | ORAL_TABLET | Freq: Every day | ORAL | 2 refills | Status: DC

## 2018-05-23 NOTE — Progress Notes (Signed)
Virtual Visit via Telephone Note  I connected with Patrick Villa on 05/23/18 at 9:24am  by telephone and verified that I am speaking with the correct person using two identifiers.   Pt location: at home  Physician location: in office, [facility] , Vic Blackbird MD  I discussed the limitations, risks, security and privacy concerns of performing an evaluation and management service by telephone and the availability of in person appointments. I also discussed with the patient that there may be a patient responsible charge related to this service. The patient expressed understanding and agreed to proceed.   History of Present Illness: HTN- his cardiologist initially added norvasc 5mg  but he had side effects with this medication he then had an additional benazepril added but this did not help with his blood pressure and he had more erectile dysfunction so it was stopped.  At his last visit, in March with the blood pressure clinic he was started on hydralazine 25mg  BID, he was continued on Benzapril- HCTZ 20-25mg  and Terasozin  at home his BP is typically 150-160/ 70-80 That he feels well he has not had any chest pain shortness of breath no headaches no dizziness.  He is staying active. Hyperlipidemia-is at goal in January he is not on any current statin drug BPH- taking proscar  Taking Vitamin D  Observations/Objective: Unable to observe   Assessment and Plan: HTN- fair control for his age, but improved from previous, discussed if BP starts to run in 170s or higher please call, we can add additional hydralazine  Hyperlipidemia- LDL at goal in December  Vitamin D def-continue on supplement  BPH- symptoms controlled with current meds    Follow Up Instructions: Plan for AWV in July     I discussed the assessment and treatment plan with the patient. The patient was provided an opportunity to ask questions and all were answered. The patient agreed with the plan and demonstrated  an understanding of the instructions.   The patient was advised to call back or seek an in-person evaluation if the symptoms worsen or if the condition fails to improve as anticipated.  I provided 11 minutes of non-face-to-face time during this encounter. End Time: 9:35am   Vic Blackbird, MD

## 2018-08-25 ENCOUNTER — Encounter: Payer: Self-pay | Admitting: Family Medicine

## 2018-08-25 ENCOUNTER — Ambulatory Visit (INDEPENDENT_AMBULATORY_CARE_PROVIDER_SITE_OTHER): Payer: Medicare Other | Admitting: Family Medicine

## 2018-08-25 VITALS — BP 158/72 | HR 94 | Temp 97.8°F | Resp 16 | Ht 66.0 in | Wt 193.0 lb

## 2018-08-25 DIAGNOSIS — E559 Vitamin D deficiency, unspecified: Secondary | ICD-10-CM

## 2018-08-25 DIAGNOSIS — Z0000 Encounter for general adult medical examination without abnormal findings: Secondary | ICD-10-CM

## 2018-08-25 DIAGNOSIS — N400 Enlarged prostate without lower urinary tract symptoms: Secondary | ICD-10-CM | POA: Diagnosis not present

## 2018-08-25 DIAGNOSIS — Z0001 Encounter for general adult medical examination with abnormal findings: Secondary | ICD-10-CM | POA: Diagnosis not present

## 2018-08-25 DIAGNOSIS — E7800 Pure hypercholesterolemia, unspecified: Secondary | ICD-10-CM

## 2018-08-25 DIAGNOSIS — I10 Essential (primary) hypertension: Secondary | ICD-10-CM | POA: Diagnosis not present

## 2018-08-25 NOTE — Progress Notes (Signed)
Subjective:   Patient presents for Medicare Annual/Subsequent preventive examination.   Patient here for annual wellness visit.  Medications reviewed. Hypertension  With AV block he is followed by cardiology he is taking his medications as prescribed. Blood pressure at home has been running good at home 130-140/ 62-80 He is currently taking Lotensin HCTZ along with hydralazine 25 mg 3 times a day  His BPH he is on finasteride and Hytrin from urology   Hyperlipidemia- does not tolerate statin drugs    - Lipids done in Jan     Review Past Medical/Family/Social: per EMR    Risk Factors  Current exercise habits: stays active Dietary issues discussed: Tes  Cardiac risk factors: HTN, Hyperlipidemia  Depression Screen  (Note: if answer to either of the following is "Yes", a more complete depression screening is indicated)  Over the past two weeks, have you felt down, depressed or hopeless? No Over the past two weeks, have you felt little interest or pleasure in doing things? No Have you lost interest or pleasure in daily life? No Do you often feel hopeless? No Do you cry easily over simple problems? No   Activities of Daily Living  In your present state of health, do you have any difficulty performing the following activities?:  Driving? No  Managing money? No  Feeding yourself? No  Getting from bed to chair? No  Climbing a flight of stairs? No  Preparing food and eating?: No  Bathing or showering? No  Getting dressed: No  Getting to the toilet? No  Using the toilet:No  Moving around from place to place: No  In the past year have you fallen or had a near fall?:No  Are you sexually active? No  Do you have more than one partner? No   Hearing Difficulties: No  Do you often ask people to speak up or repeat themselves? No  Do you experience ringing or noises in your ears? No Do you have difficulty understanding soft or whispered voices? No  Do you feel that you have a problem  with memory? No Do you often misplace items? No  Do you feel safe at home? Yes  Cognitive Testing  Alert? Yes Normal Appearance?Yes  Oriented to person? Yes Place? Yes  Time? Yes  Recall of three objects? Yes  Can perform simple calculations? Yes  Displays appropriate judgment?Yes  Can read the correct time from a watch face?Yes   List the Names of Other Physician/Practitioners you currently use:  Cardiology Chiropracter Urology GI- Dr. Cristina Gong   Screening Tests / Date Colonoscopy   - Dr. Cristina Gong- as history of colon polyps he is due for repeat scope- just waiting for GI   Zostavax - Due for shingrix  Pneumonia-UTD Influenza Vaccine  UTD Tetanus/tdap - UTD  ROS: GEN- denies fatigue, fever, weight loss,weakness, recent illness HEENT- denies eye drainage, change in vision, nasal discharge, CVS- denies chest pain, palpitations RESP- denies SOB, cough, wheeze ABD- denies N/V, change in stools, abd pain GU- denies dysuria, hematuria, dribbling, incontinence MSK- denies joint pain, muscle aches, injury Neuro- denies headache, dizziness, syncope, seizure activity  Physical: GEN- NAD, alert and oriented x3 HEENT- PERRL, EOMI, non injected sclera, pink conjunctiva, MMM, oropharynx clear Neck- Supple, no thryomegaly, no bruit  CVS- RRR, no murmur RESP-CTAB ABD-NABS,soft,NT,ND EXT- No edema Pulses- Radial, DP- 2+   Assessment:    Annual wellness medicare exam   Plan:    During the course of the visit the patient was educated and counseled  about appropriate screening and preventive services including:    FALL/AUDIT C/DEPRESSION SCREEN NEGATIVE   HTN- mildly elevated at home, he does monitor no changes to meds  Vitamin D- continue supplement, check Vitamin D levels   BPH per urology and PSA defer to urology   Immunizations- will send shingrix to see if covered   Given handout on advanced directives     Diet review for nutrition referral? Yes ____ Not  Indicated __x__  Patient Instructions (the written plan) was given to the patient.  Medicare Attestation  I have personally reviewed:  The patient's medical and social history  Their use of alcohol, tobacco or illicit drugs  Their current medications and supplements  The patient's functional ability including ADLs,fall risks, home safety risks, cognitive, and hearing and visual impairment  Diet and physical activities  Evidence for depression or mood disorders  The patient's weight, height, BMI, and visual acuity have been recorded in the chart. I have made referrals, counseling, and provided education to the patient based on review of the above and I have provided the patient with a written personalized care plan for preventive services.

## 2018-08-25 NOTE — Patient Instructions (Signed)
F/U 6 months

## 2018-08-26 LAB — COMPREHENSIVE METABOLIC PANEL
AG Ratio: 1.5 (calc) (ref 1.0–2.5)
ALT: 18 U/L (ref 9–46)
AST: 19 U/L (ref 10–35)
Albumin: 4.1 g/dL (ref 3.6–5.1)
Alkaline phosphatase (APISO): 54 U/L (ref 35–144)
BUN/Creatinine Ratio: 16 (calc) (ref 6–22)
BUN: 20 mg/dL (ref 7–25)
CO2: 26 mmol/L (ref 20–32)
Calcium: 10.2 mg/dL (ref 8.6–10.3)
Chloride: 105 mmol/L (ref 98–110)
Creat: 1.29 mg/dL — ABNORMAL HIGH (ref 0.70–1.11)
Globulin: 2.7 g/dL (calc) (ref 1.9–3.7)
Glucose, Bld: 109 mg/dL — ABNORMAL HIGH (ref 65–99)
Potassium: 3.7 mmol/L (ref 3.5–5.3)
Sodium: 142 mmol/L (ref 135–146)
Total Bilirubin: 0.3 mg/dL (ref 0.2–1.2)
Total Protein: 6.8 g/dL (ref 6.1–8.1)

## 2018-08-26 LAB — VITAMIN D 25 HYDROXY (VIT D DEFICIENCY, FRACTURES): Vit D, 25-Hydroxy: 34 ng/mL (ref 30–100)

## 2018-08-26 LAB — CBC WITH DIFFERENTIAL/PLATELET
Absolute Monocytes: 562 cells/uL (ref 200–950)
Basophils Absolute: 51 cells/uL (ref 0–200)
Basophils Relative: 0.7 %
Eosinophils Absolute: 423 cells/uL (ref 15–500)
Eosinophils Relative: 5.8 %
HCT: 37 % — ABNORMAL LOW (ref 38.5–50.0)
Hemoglobin: 12.4 g/dL — ABNORMAL LOW (ref 13.2–17.1)
Lymphs Abs: 2044 cells/uL (ref 850–3900)
MCH: 28.1 pg (ref 27.0–33.0)
MCHC: 33.5 g/dL (ref 32.0–36.0)
MCV: 83.7 fL (ref 80.0–100.0)
MPV: 11.5 fL (ref 7.5–12.5)
Monocytes Relative: 7.7 %
Neutro Abs: 4219 cells/uL (ref 1500–7800)
Neutrophils Relative %: 57.8 %
Platelets: 252 10*3/uL (ref 140–400)
RBC: 4.42 10*6/uL (ref 4.20–5.80)
RDW: 13.9 % (ref 11.0–15.0)
Total Lymphocyte: 28 %
WBC: 7.3 10*3/uL (ref 3.8–10.8)

## 2018-08-28 ENCOUNTER — Telehealth: Payer: Self-pay | Admitting: *Deleted

## 2018-08-28 MED ORDER — SHINGRIX 50 MCG/0.5ML IM SUSR: 0.5000 mL | Freq: Once | INTRAMUSCULAR | 1 refills | Status: AC

## 2018-08-28 NOTE — Telephone Encounter (Signed)
Prescription sent to pharmacy.

## 2018-08-28 NOTE — Telephone Encounter (Signed)
-----   Message from Alycia Rossetti, MD sent at 08/25/2018  5:09 PM EDT ----- Regarding: Send Shingrix to pharmacy

## 2018-09-18 ENCOUNTER — Other Ambulatory Visit: Payer: Self-pay | Admitting: Family Medicine

## 2018-10-03 ENCOUNTER — Telehealth: Payer: Self-pay | Admitting: *Deleted

## 2018-10-03 NOTE — Telephone Encounter (Signed)
A message was left, re: follow up visit. 

## 2018-10-05 DIAGNOSIS — R310 Gross hematuria: Secondary | ICD-10-CM | POA: Diagnosis not present

## 2018-10-12 ENCOUNTER — Ambulatory Visit (INDEPENDENT_AMBULATORY_CARE_PROVIDER_SITE_OTHER): Payer: Medicare Other | Admitting: Cardiology

## 2018-10-12 ENCOUNTER — Other Ambulatory Visit: Payer: Self-pay

## 2018-10-12 ENCOUNTER — Encounter: Payer: Self-pay | Admitting: Cardiology

## 2018-10-12 VITALS — BP 178/92 | HR 82 | Temp 98.2°F | Ht 66.0 in | Wt 195.6 lb

## 2018-10-12 DIAGNOSIS — I10 Essential (primary) hypertension: Secondary | ICD-10-CM | POA: Diagnosis not present

## 2018-10-12 DIAGNOSIS — Z7189 Other specified counseling: Secondary | ICD-10-CM

## 2018-10-12 NOTE — Progress Notes (Signed)
Cardiology Office Note:    Date:  10/12/2018   ID:  Patrick Villa, DOB December 03, 1933, MRN [id number]  PCP:  Alycia Rossetti, MD  Cardiologist:  Buford Dresser, MD PhD  Referring MD: Alycia Rossetti, MD   CC: follow up of hypertension  History of Present Illness:    Patrick Villa is a 85 y.o. male with a hx of COPD, hyperlipidemia, hypertension, second degree AV block type I who is seen in follow up today. I initially saw him as a new consult 01/2018 at the request of [place], Modena Nunnery, MD for the evaluation and management of second degree, type I heart block.   He was seen in the hospital on 01/27/18 and 01/28/18 by Drs. Fredrik Rigger for bilateral breast tenderness. At that time, troponins were negative, echo was unremarkable. Cardizem was stopped due to second degree AV block, type I.   When he first saw me, we discussed an exercise treadmill stress test, but first goal was to get BP under better control. He initially met with our PharmD HTN clinic for this, but he did not return for follow up.   Today: No complaints, breathing is better. Occasional burning at the top of his stomach, nonexertional, no related symptoms. Has a past history of ulcers, states it feels similar to that.  BP at home 140s-150s/80s. This AM 175/87 before medication, 202/99 on initial check here, on recheck 178/92.  Feels well, gets up every morning around 6 AM, then tinkers/busy all day working, energy is good. No limitations. Rests in the evening, goes to bed at 9 PM. Denies chest pain, shortness of breath at rest or with normal exertion. No PND, orthopnea, LE edema or unexpected weight gain. No syncope or palpitations.  No vision changes or headaches. He cannot tell when his BP is elevated.   We attempted to do a medication reconciliation together today. He was uncertain of some of his medications; for instance, he thought that the benazepril-HCTZ had been stopped or split, but I cannot find record of  this. He will call us when he gets home to let us know what is on his pill bottles.   Past Medical History:  Diagnosis Date  . Colon polyps   . COPD (chronic obstructive pulmonary disease) ([place])   . Echocardiogram abnormal   . Ganglion cyst   . Hematuria   . Hyperlipidemia   . Hypertension   . Systolic murmur     Past Surgical History:  Procedure Laterality Date  . COLON SURGERY    . TRANSTHORACIC ECHOCARDIOGRAM      Current Medications: Current Outpatient Medications on File Prior to Visit  Medication Sig  . aspirin 325 MG tablet Take 325 mg by mouth daily.    . benazepril-hydrochlorthiazide (LOTENSIN HCT) 20-25 MG tablet Take 1 tablet by mouth daily.  . Cholecalciferol (VITAMIN D) 2000 UNITS CAPS Take 1 capsule (2,000 Units total) by mouth daily.  . finasteride (PROSCAR) 5 MG tablet Take 1 tablet by mouth daily  . hydrALAZINE (APRESOLINE) 25 MG tablet TAKE 1 TABLET BY MOUTH 3  TIMES DAILY  . terazosin (HYTRIN) 10 MG capsule Take 1 capsule by mouth daily  HS   No current facility-administered medications on file prior to visit.      Allergies:   Amlodipine, Lipitor [atorvastatin calcium], Zocor [simvastatin], Bactrim [sulfamethoxazole-trimethoprim], and Pravachol   Social History   Socioeconomic History  . Marital status: Widowed    Spouse name: Not on file  .  Number of children: Not on file  . Years of education: Not on file  . Highest education level: Not on file  Occupational History  . Not on file  Social Needs  . Financial resource strain: Not on file  . Food insecurity    Worry: Not on file    Inability: Not on file  . Transportation needs    Medical: Not on file    Non-medical: Not on file  Tobacco Use  . Smoking status: Former Smoker    Quit date: 10/18/1981    Years since quitting: 37.0  . Smokeless tobacco: Never Used  Substance and Sexual Activity  . Alcohol use: No  . Drug use: No  . Sexual activity: Not on file  Lifestyle  . Physical  activity    Days per week: Not on file    Minutes per session: Not on file  . Stress: Not on file  Relationships  . Social Herbalist on phone: Not on file    Gets together: Not on file    Attends religious service: Not on file    Active member of club or organization: Not on file    Attends meetings of clubs or organizations: Not on file    Relationship status: Not on file  Other Topics Concern  . 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   No other exercise   Plays guitar.   Separated. Lives alone.     Family History: The patient's family history includes Diabetes in his mother; Stroke in his brother.  ROS:   Please see the history of present illness.  Additional pertinent ROS: Constitutional: Negative for chills, fever, night sweats, unintentional weight loss  HENT: Negative for ear pain and hearing loss.   Eyes: Negative for loss of vision and eye pain.  Respiratory: Negative for cough, sputum, wheezing.   Cardiovascular: See HPI. Gastrointestinal: Negative for abdominal pain, melena, and hematochezia.  Genitourinary: Negative for dysuria and hematuria.  Musculoskeletal: Negative for falls and myalgias.  Skin: Negative for itching and rash.  Neurological: Negative for focal weakness, focal sensory changes and loss of consciousness.  Endo/Heme/Allergies: Does not bruise/bleed easily.    EKGs/Labs/Other Studies Reviewed:    The following studies were reviewed today: Echo 01/28/18 - Left ventricle: The cavity size was normal. Wall thickness was   normal. Systolic function was normal. The estimated ejection   fraction was in the range of 55% to 60%. Wall motion was normal;   there were no regional wall motion abnormalities. The study is   not technically sufficient to allow evaluation of LV diastolic   function. - Atrial septum: There was an atrial septal aneurysm.  Impressions:  - Normal LV  function; sclerotic aortic valve.  EKG:  EKG is personally reviewed.  The ekg ordered 02/20/18 demonstrates sinus rhythm with 2nd degree AV block type I (Wenkebach)  Recent Labs: 08/25/2018: ALT 18; BUN 20; Creat 1.29; Hemoglobin 12.4; Platelets 252; Potassium 3.7; Sodium 142  Recent Lipid Panel    Component Value Date/Time   CHOL 147 01/28/2018 0538   TRIG 90 01/28/2018 0538   HDL 31 (L) 01/28/2018 0538   CHOLHDL 4.7 01/28/2018 0538   VLDL 18 01/28/2018 0538   LDLCALC 98 01/28/2018 0538    Physical Exam:    VS:  BP (!) 178/92 (BP Location: Left Arm, Patient Position: Sitting, Cuff Size: Normal)   Pulse 82  Temp 98.2 F (36.8 C)   Ht 5' 6"  (1.676 m)   Wt 195 lb 9.6 oz (88.7 kg)   SpO2 100%   BMI 31.57 kg/m     Wt Readings from Last 3 Encounters:  10/12/18 195 lb 9.6 oz (88.7 kg)  08/25/18 193 lb (87.5 kg)  04/04/18 201 lb 6.4 oz (91.4 kg)    GEN: Well nourished, well developed in no acute distress HEENT: Normal, moist mucous membranes NECK: No JVD CARDIAC: regular rhythm, normal S1 and S2, no rubs, gallops. 1/6 SEM at LUSB VASCULAR: Radial and DP pulses 2+ bilaterally. No carotid bruits RESPIRATORY:  Clear to auscultation without rales, wheezing or rhonchi  ABDOMEN: Soft, non-tender, non-distended MUSCULOSKELETAL:  Ambulates independently SKIN: Warm and dry, no edema NEUROLOGIC:  Alert and oriented x 3. No focal neuro deficits noted. PSYCHIATRIC:  Normal affect   ASSESSMENT:    1. Essential hypertension   2. Cardiac risk counseling   3. Counseling on health promotion and disease prevention    PLAN:    Hypertension: reports better control at home. High in the office today, asymptomatic -he cannot tell me what medications he is taking definitively. He will look at his pill bottles at home and call us with the meds he is on -cannot make adjustments without knowing his meds. -tried amlodipine, had jitteriness/tremors. Had chest wall pain on spironolactone.  -no  diltiazem/verapamil, beta blocker due to AV block -he isn't sure if he is still taking benazepril-HCTZ -is taking hydralazine, is fairly regular about TID dosing.  -is on terazosin as well  Aspirin use: with a history of ulcer, and given his age, he does not need to be on an aspirin from my perspective. He is uncertain why he is on a full dose aspirin. He will discuss with his PCP, but without known history of PAD/CAD, at most I would have him on 81 mg aspirin daily, and without clear history I do not think he needs aspirin for primary prevention at all.  Murmur: aortic sclerosis without stenosis, no further workup needed. Echo 01/2018  Wenkebach (second degree AV block, type I): no high risk symptoms. No indication for pacemaker. Avoid AV nodal agents. Will see HR response on treadmill study once BP better controlled.  History of chest discomfort: none like his prior symptoms. Plan to evaluated with exercise treadmill once BP better controlled.  Plan for follow up: HTN clinic 3-4 weeks, me in 3 mos  Medication Adjustments/Labs and Tests Ordered: Current medicines are reviewed at length with the patient today.  Concerns regarding medicines are outlined above.  No orders of the defined types were placed in this encounter.  No orders of the defined types were placed in this encounter.   Patient Instructions  Medication Instructions:  Your Physician recommend you continue on your current medication as directed.    If you need a refill on your cardiac medications before your next appointment, please call your pharmacy.   Lab work: None  Testing/Procedures: None  Follow-Up: At [facility], you and your health needs are our priority.  As part of our continuing mission to provide you with exceptional heart care, we have created designated Provider Care Teams.  These Care Teams include your primary Cardiologist (physician) and Advanced Practice Providers (APPs -  Physician Assistants and  Nurse Practitioners) who all work together to provide you with the care you need, when you need it. You will need a follow up appointment in 3 months.  Please call our  office 2 months in advance to schedule this appointment.  You may see Buford Dresser, MD or one of the following Advanced Practice Providers on your designated Care Team:   Rosaria Ferries, PA-C . Jory Sims, DNP, ANP  Your physician recommends that you schedule a follow-up appointment in 4 weeks with Hypertension Clinic.        Signed, Buford Dresser, MD PhD 10/12/2018 10:03 PM    [place]

## 2018-10-12 NOTE — Patient Instructions (Signed)
Medication Instructions:  Your Physician recommend you continue on your current medication as directed.    If you need a refill on your cardiac medications before your next appointment, please call your pharmacy.   Lab work: None  Testing/Procedures: None  Follow-Up: At [facility], you and your health needs are our priority.  As part of our continuing mission to provide you with exceptional heart care, we have created designated Provider Care Teams.  These Care Teams include your primary Cardiologist (physician) and Advanced Practice Providers (APPs -  Physician Assistants and Nurse Practitioners) who all work together to provide you with the care you need, when you need it. You will need a follow up appointment in 3 months.  Please call our office 2 months in advance to schedule this appointment.  You may see Buford Dresser, MD or one of the following Advanced Practice Providers on your designated Care Team:   Rosaria Ferries, PA-C . Jory Sims, DNP, ANP  Your physician recommends that you schedule a follow-up appointment in 4 weeks with Hypertension Clinic.

## 2018-10-16 ENCOUNTER — Encounter: Payer: Self-pay | Admitting: Cardiology

## 2018-10-17 ENCOUNTER — Telehealth: Payer: Self-pay

## 2018-10-17 NOTE — Telephone Encounter (Signed)
Called pt to go over current medication list. Left message to call back.

## 2018-10-18 NOTE — Telephone Encounter (Signed)
Spoke to pt and verified medication list is current.

## 2018-10-19 ENCOUNTER — Other Ambulatory Visit: Payer: Self-pay

## 2018-10-19 ENCOUNTER — Ambulatory Visit (INDEPENDENT_AMBULATORY_CARE_PROVIDER_SITE_OTHER): Payer: Medicare Other | Admitting: *Deleted

## 2018-10-19 DIAGNOSIS — Z23 Encounter for immunization: Secondary | ICD-10-CM | POA: Diagnosis not present

## 2018-10-19 NOTE — Progress Notes (Signed)
Patient seen in office for Influenza Vaccination.   Tolerated IM administration well.   Immunization history updated.  

## 2019-01-12 ENCOUNTER — Other Ambulatory Visit: Payer: Self-pay

## 2019-01-12 ENCOUNTER — Encounter: Payer: Self-pay | Admitting: Cardiology

## 2019-01-12 ENCOUNTER — Ambulatory Visit: Payer: Medicare Other | Admitting: Cardiology

## 2019-01-12 ENCOUNTER — Encounter (INDEPENDENT_AMBULATORY_CARE_PROVIDER_SITE_OTHER): Payer: Self-pay

## 2019-01-12 VITALS — BP 168/94 | HR 72 | Ht 66.0 in | Wt 200.4 lb

## 2019-01-12 DIAGNOSIS — R079 Chest pain, unspecified: Secondary | ICD-10-CM

## 2019-01-12 DIAGNOSIS — Z7982 Long term (current) use of aspirin: Secondary | ICD-10-CM

## 2019-01-12 DIAGNOSIS — Z79899 Other long term (current) drug therapy: Secondary | ICD-10-CM

## 2019-01-12 DIAGNOSIS — R011 Cardiac murmur, unspecified: Secondary | ICD-10-CM | POA: Diagnosis not present

## 2019-01-12 DIAGNOSIS — I10 Essential (primary) hypertension: Secondary | ICD-10-CM

## 2019-01-12 MED ORDER — ASPIRIN EC 81 MG PO TBEC: 81.0000 mg | DELAYED_RELEASE_TABLET | Freq: Every day | ORAL | 11 refills | Status: DC

## 2019-01-12 MED ORDER — HYDRALAZINE HCL 50 MG PO TABS: 50.0000 mg | ORAL_TABLET | Freq: Three times a day (TID) | ORAL | 11 refills | Status: DC

## 2019-01-12 NOTE — Progress Notes (Signed)
Cardiology Office Note:    Date:  02/11/2019   ID:  Patrick Villa, DOB May 16, 1933, MRN [id number]  PCP:  Alycia Rossetti, MD  Cardiologist:  Buford Dresser, MD PhD  Referring MD: Alycia Rossetti, MD   CC: follow up of hypertension  History of Present Illness:    Patrick Villa is a 85 y.o. male with a hx of COPD, hyperlipidemia, hypertension, second degree AV block type I who is seen in follow up today. I initially saw him as a new consult 01/2018 at the request of [place], Modena Nunnery, MD for the evaluation and management of second degree, type I heart block.   He was seen in the hospital on 01/27/18 and 01/28/18 by Drs. Fredrik Rigger for bilateral breast tenderness. At that time, troponins were negative, echo was unremarkable. Cardizem was stopped due to second degree AV block, type I.   When he first saw me, we discussed an exercise treadmill stress test, but first goal was to get BP under better control. He initially met with our PharmD HTN clinic for this, but he did not return for follow up.   Today: BP at home runs up to 242P systolic, lowest 536 that he has seen. Always high in the office. We spent significant time today reviewing goal BP, medications, diet, exercise. We have had trouble with medication reconciliation in the past--doesn't have the bottle with him today but verbally agrees with the list I have.   No symptoms, feels well. Energy is good. Denies chest pain, shortness of breath at rest or with normal exertion. No PND, orthopnea, LE edema or unexpected weight gain. No syncope or palpitations.  Having left breast burning/sharp pain with tenderness. Tender on exam today. Has a history of breast tenderness, and this is similar to prior. He plans to discuss with his PCP.  Past Medical History:  Diagnosis Date  . Colon polyps   . COPD (chronic obstructive pulmonary disease) ([place])   . Echocardiogram abnormal   . Ganglion cyst   . Hematuria   . Hyperlipidemia   .  Hypertension   . Systolic murmur     Past Surgical History:  Procedure Laterality Date  . COLON SURGERY    . TRANSTHORACIC ECHOCARDIOGRAM      Current Medications: Current Outpatient Medications on File Prior to Visit  Medication Sig  . Cholecalciferol (VITAMIN D) 2000 UNITS CAPS Take 1 capsule (2,000 Units total) by mouth daily.  . finasteride (PROSCAR) 5 MG tablet Take 1 tablet by mouth daily  . terazosin (HYTRIN) 10 MG capsule Take 1 capsule by mouth daily  HS   No current facility-administered medications on file prior to visit.     Allergies:   Amlodipine, Lipitor [atorvastatin calcium], Zocor [simvastatin], Bactrim [sulfamethoxazole-trimethoprim], and Pravachol   Social History   Tobacco Use  . Smoking status: Former Smoker    Quit date: 10/18/1981    Years since quitting: 37.3  . Smokeless tobacco: Never Used  Substance Use Topics  . Alcohol use: No  . Drug use: No    Family History: The patient's family history includes Diabetes in his mother; Stroke in his brother.  ROS:   Please see the history of present illness.  Additional pertinent ROS negative except as documented.    EKGs/Labs/Other Studies Reviewed:    The following studies were reviewed today: Echo 01/28/18 - Left ventricle: The cavity size was normal. Wall thickness was   normal. Systolic function was normal. The estimated ejection  fraction was in the range of 55% to 60%. Wall motion was normal;   there were no regional wall motion abnormalities. The study is   not technically sufficient to allow evaluation of LV diastolic   function. - Atrial septum: There was an atrial septal aneurysm.  Impressions:  - Normal LV function; sclerotic aortic valve.  EKG:  EKG is personally reviewed.  The ekg ordered today demonstrates sinus rhythm, 1st degree AV block, LAFB, PRWP  Recent Labs: 08/25/2018: ALT 18 02/02/2019: BUN 18; Creatinine, Ser 1.17; Hemoglobin 12.2; Platelets 214; Potassium 3.7; Sodium  138  Recent Lipid Panel    Component Value Date/Time   CHOL 147 01/28/2018 0538   TRIG 90 01/28/2018 0538   HDL 31 (L) 01/28/2018 0538   CHOLHDL 4.7 01/28/2018 0538   VLDL 18 01/28/2018 0538   LDLCALC 98 01/28/2018 0538    Physical Exam:    VS:  BP (!) 168/94 (BP Location: Right Arm, Patient Position: Supine, Cuff Size: Large)   Pulse 72   Ht _0  (1.676 m)   Wt 200 lb 6.4 oz (90.9 kg)   SpO2 99%   BMI 32.35 kg/m     Wt Readings from Last 3 Encounters:  02/06/19 196 lb (88.9 kg)  02/02/19 196 lb (88.9 kg)  02/02/19 196 lb (88.9 kg)    GEN: Well nourished, well developed in no acute distress HEENT: Normal, moist mucous membranes NECK: No JVD CARDIAC: regular rhythm, normal S1 and S2, no rubs or gallops. 1/6 SEM. Mildly TTP bilateral chest. VASCULAR: Radial and DP pulses 2+ bilaterally. No carotid bruits RESPIRATORY:  Clear to auscultation without rales, wheezing or rhonchi  ABDOMEN: Soft, non-tender, non-distended MUSCULOSKELETAL:  Ambulates independently SKIN: Warm and dry, no edema NEUROLOGIC:  Alert and oriented x 3. No focal neuro deficits noted. PSYCHIATRIC:  Normal affect   ASSESSMENT:    1. Essential hypertension   2. Chest pain, unspecified type   3. Medication management   4. Systolic murmur   5. Current use of aspirin    PLAN:    Hypertension: remains elevated above goal -check BMET. If ok, add additional dose of benazepril to his combo benazepril-HCTZ dose -tried amlodipine, had jitteriness/tremors. Had chest wall pain on spironolactone.  -no diltiazem/verapamil, beta blocker due to AV block -is taking hydralazine, is fairly regular about TID dosing. Increase from 25 mg to 50 mg TID today  -is on terazosin as well  Aspirin use: with a history of ulcer, and given his age, he does not need to be on an aspirin from my perspective. He is uncertain why he is on a full dose aspirin. He will drop back to 81 mg aspirin, and pending results of stress test may  be able to drop entirely.  Murmur: aortic sclerosis without stenosis, no further workup needed. Echo 01/2018  History of chest discomfort: discussed lexiscan stress test. Atypical symptoms, tender to palpation. Recommend he follow up with his PCP if lexiscan normal.  Plan for follow up: 2 mos or sooner PRN  Medication Adjustments/Labs and Tests Ordered: Current medicines are reviewed at length with the patient today.  Concerns regarding medicines are outlined above.  Orders Placed This Encounter  Procedures  . Basic Metabolic Panel (BMET)  . EKG 12-Lead   Meds ordered this encounter  Medications  . hydrALAZINE (APRESOLINE) 50 MG tablet    Sig: Take 1 tablet (50 mg total) by mouth 3 (three) times daily.    Dispense:  90 tablet  Refill:  11    Pt will call at time of refill  . aspirin EC 81 MG tablet    Sig: Take 1 tablet (81 mg total) by mouth daily.    Dispense:  30 tablet    Refill:  11    Patient Instructions  Medication Instructions:  Recommend dropping from 325 mg aspirin to 81 mg aspirin (same cardiovascular benefit, less risk of bleeding and stomach upset).  INCREASE HYDRALAZINE TO 50MG THREE TIMES A DAY  *If you need a refill on your cardiac medications before your next appointment, please call your pharmacy*  Lab Work:  Will check kidney function today. If ok, we will go up on the benazepril part of your combo pill--will send an extra dose of the benazepril only until you are due for refills.  If you have labs (blood work) drawn today and your tests are completely normal, you will receive your results only by: Marland Kitchen MyChart Message (if you have MyChart) OR . A paper copy in the mail If you have any lab test that is abnormal or we need to change your treatment, we will call you to review the results.  Testing/Procedures: NONE NEEDED   Follow-Up: At [facility], you and your health needs are our priority.  As part of our continuing mission to provide you with  exceptional heart care, we have created designated Provider Care Teams.  These Care Teams include your primary Cardiologist (physician) and Advanced Practice Providers (APPs -  Physician Assistants and Nurse Practitioners) who all work together to provide you with the care you need, when you need it.  Your next appointment:   2 month(s)  The format for your next appointment:   Virtual Visit   Provider:   Buford Dresser, MD .     Signed, Buford Dresser, MD PhD 02/11/2019    [place]

## 2019-01-12 NOTE — Patient Instructions (Addendum)
Medication Instructions:  Recommend dropping from 325 mg aspirin to 81 mg aspirin (same cardiovascular benefit, less risk of bleeding and stomach upset).  INCREASE HYDRALAZINE TO 50MG  THREE TIMES A DAY  *If you need a refill on your cardiac medications before your next appointment, please call your pharmacy*  Lab Work:  Will check kidney function today. If ok, we will go up on the benazepril part of your combo pill--will send an extra dose of the benazepril only until you are due for refills.  If you have labs (blood work) drawn today and your tests are completely normal, you will receive your results only by: Marland Kitchen MyChart Message (if you have MyChart) OR . A paper copy in the mail If you have any lab test that is abnormal or we need to change your treatment, we will call you to review the results.  Testing/Procedures: NONE NEEDED   Follow-Up: At [facility], you and your health needs are our priority.  As part of our continuing mission to provide you with exceptional heart care, we have created designated Provider Care Teams.  These Care Teams include your primary Cardiologist (physician) and Advanced Practice Providers (APPs -  Physician Assistants and Nurse Practitioners) who all work together to provide you with the care you need, when you need it.  Your next appointment:   2 month(s)  The format for your next appointment:   Virtual Visit   Provider:   Buford Dresser, MD .

## 2019-01-13 LAB — BASIC METABOLIC PANEL
BUN/Creatinine Ratio: 17 (ref 10–24)
BUN: 17 mg/dL (ref 8–27)
CO2: 27 mmol/L (ref 20–29)
Calcium: 10.5 mg/dL — ABNORMAL HIGH (ref 8.6–10.2)
Chloride: 101 mmol/L (ref 96–106)
Creatinine, Ser: 1.03 mg/dL (ref 0.76–1.27)
GFR calc Af Amer: 77 mL/min/{1.73_m2} (ref >59)
GFR calc non Af Amer: 67 mL/min/{1.73_m2} (ref >59)
Glucose: 87 mg/dL (ref 65–99)
Potassium: 4.2 mmol/L (ref 3.5–5.2)
Sodium: 141 mmol/L (ref 134–144)

## 2019-01-15 ENCOUNTER — Other Ambulatory Visit: Payer: Self-pay

## 2019-01-15 DIAGNOSIS — Z79899 Other long term (current) drug therapy: Secondary | ICD-10-CM

## 2019-01-15 MED ORDER — BENAZEPRIL HCL 20 MG PO TABS: 20.0000 mg | ORAL_TABLET | Freq: Every day | ORAL | 11 refills | Status: DC

## 2019-02-02 ENCOUNTER — Emergency Department (HOSPITAL_COMMUNITY): Payer: Medicare Other

## 2019-02-02 ENCOUNTER — Encounter: Payer: Self-pay | Admitting: Family Medicine

## 2019-02-02 ENCOUNTER — Ambulatory Visit (INDEPENDENT_AMBULATORY_CARE_PROVIDER_SITE_OTHER): Payer: Medicare Other | Admitting: Family Medicine

## 2019-02-02 ENCOUNTER — Other Ambulatory Visit: Payer: Self-pay

## 2019-02-02 ENCOUNTER — Other Ambulatory Visit: Payer: Self-pay | Admitting: Cardiology

## 2019-02-02 ENCOUNTER — Emergency Department (HOSPITAL_COMMUNITY)
Admission: EM | Admit: 2019-02-02 | Discharge: 2019-02-02 | Disposition: A | Discharge status: Home / Self Care | Payer: Medicare Other | Attending: Emergency Medicine | Admitting: Emergency Medicine

## 2019-02-02 ENCOUNTER — Other Ambulatory Visit: Payer: Self-pay | Admitting: Family Medicine

## 2019-02-02 VITALS — BP 184/92 | HR 94 | Temp 98.7°F | Resp 14 | Ht 66.0 in | Wt 196.0 lb

## 2019-02-02 DIAGNOSIS — Z7982 Long term (current) use of aspirin: Secondary | ICD-10-CM | POA: Diagnosis not present

## 2019-02-02 DIAGNOSIS — R0789 Other chest pain: Secondary | ICD-10-CM | POA: Diagnosis not present

## 2019-02-02 DIAGNOSIS — I10 Essential (primary) hypertension: Secondary | ICD-10-CM | POA: Insufficient documentation

## 2019-02-02 DIAGNOSIS — R072 Precordial pain: Secondary | ICD-10-CM | POA: Diagnosis not present

## 2019-02-02 DIAGNOSIS — Z79899 Other long term (current) drug therapy: Secondary | ICD-10-CM | POA: Diagnosis not present

## 2019-02-02 DIAGNOSIS — R42 Dizziness and giddiness: Secondary | ICD-10-CM | POA: Diagnosis not present

## 2019-02-02 DIAGNOSIS — R079 Chest pain, unspecified: Secondary | ICD-10-CM | POA: Diagnosis not present

## 2019-02-02 DIAGNOSIS — I499 Cardiac arrhythmia, unspecified: Secondary | ICD-10-CM | POA: Diagnosis not present

## 2019-02-02 DIAGNOSIS — I441 Atrioventricular block, second degree: Secondary | ICD-10-CM | POA: Diagnosis not present

## 2019-02-02 DIAGNOSIS — Z87891 Personal history of nicotine dependence: Secondary | ICD-10-CM | POA: Diagnosis not present

## 2019-02-02 DIAGNOSIS — Z743 Need for continuous supervision: Secondary | ICD-10-CM | POA: Diagnosis not present

## 2019-02-02 DIAGNOSIS — J449 Chronic obstructive pulmonary disease, unspecified: Secondary | ICD-10-CM | POA: Insufficient documentation

## 2019-02-02 LAB — BASIC METABOLIC PANEL
Anion gap: 8 (ref 5–15)
BUN: 18 mg/dL (ref 8–23)
CO2: 25 mmol/L (ref 22–32)
Calcium: 9.5 mg/dL (ref 8.9–10.3)
Chloride: 105 mmol/L (ref 98–111)
Creatinine, Ser: 1.17 mg/dL (ref 0.61–1.24)
GFR calc Af Amer: >60 mL/min (ref >60)
GFR calc non Af Amer: 57 mL/min — ABNORMAL LOW (ref >60)
Glucose, Bld: 91 mg/dL (ref 70–99)
Potassium: 3.7 mmol/L (ref 3.5–5.1)
Sodium: 138 mmol/L (ref 135–145)

## 2019-02-02 LAB — CBC WITH DIFFERENTIAL/PLATELET
Abs Immature Granulocytes: 0.01 10*3/uL (ref 0.00–0.07)
Basophils Absolute: 0 10*3/uL (ref 0.0–0.1)
Basophils Relative: 1 %
Eosinophils Absolute: 0.2 10*3/uL (ref 0.0–0.5)
Eosinophils Relative: 4 %
HCT: 39.1 % (ref 39.0–52.0)
Hemoglobin: 12.2 g/dL — ABNORMAL LOW (ref 13.0–17.0)
Immature Granulocytes: 0 %
Lymphocytes Relative: 24 %
Lymphs Abs: 1.3 10*3/uL (ref 0.7–4.0)
MCH: 27.7 pg (ref 26.0–34.0)
MCHC: 31.2 g/dL (ref 30.0–36.0)
MCV: 88.7 fL (ref 80.0–100.0)
Monocytes Absolute: 0.6 10*3/uL (ref 0.1–1.0)
Monocytes Relative: 10 %
Neutro Abs: 3.3 10*3/uL (ref 1.7–7.7)
Neutrophils Relative %: 61 %
Platelets: 214 10*3/uL (ref 150–400)
RBC: 4.41 MIL/uL (ref 4.22–5.81)
RDW: 13.5 % (ref 11.5–15.5)
WBC: 5.5 10*3/uL (ref 4.0–10.5)
nRBC: 0 % (ref 0.0–0.2)

## 2019-02-02 LAB — TROPONIN I (HIGH SENSITIVITY)
Troponin I (High Sensitivity): 19 ng/L — ABNORMAL HIGH (ref <18)
Troponin I (High Sensitivity): 20 ng/L — ABNORMAL HIGH (ref <18)

## 2019-02-02 MED ORDER — HYDRALAZINE HCL 25 MG PO TABS
50.0000 mg | ORAL_TABLET | Freq: Three times a day (TID) | ORAL | Status: DC
  Filled 2019-02-02: qty 2

## 2019-02-02 MED ORDER — NITROGLYCERIN 0.4 MG SL SUBL
0.4000 mg | SUBLINGUAL_TABLET | Freq: Once | SUBLINGUAL | Status: AC
  Administered 2019-02-02: 0.4 mg via SUBLINGUAL

## 2019-02-02 MED ORDER — ASPIRIN 81 MG PO CHEW
243.0000 mg | CHEWABLE_TABLET | Freq: Once | ORAL | Status: AC
  Administered 2019-02-02: 243 mg via ORAL

## 2019-02-02 MED ORDER — MORPHINE SULFATE (PF) 4 MG/ML IV SOLN
4.0000 mg | Freq: Once | INTRAVENOUS | Status: AC
  Administered 2019-02-02: 11:00:00 4 mg via INTRAVENOUS
  Filled 2019-02-02: qty 1

## 2019-02-02 NOTE — ED Provider Notes (Signed)
[facility] EMERGENCY DEPARTMENT Provider Note   CSN: [id number] Arrival date & time: 02/02/19  1018    History Chest pain  Patrick Villa is a 86 y.o. male with past medical history significant for COPD, hypertension, hyperlipidemia, second-degree AV block, Mobitz type I who presents for evaluation of chest pain.  Patient states he developed left-sided chest pain yesterday.  Patient states originally started out as a burning type sensation in the left portion of his chest.  Patient states he went to sleep woke up this morning and it radiated across the right side of his chest and is now into his right arm.  Patient states his right arm feels like it has an intermittent burning sensation to it.  Patient states his left-sided chest pain feels like a "tightening" sensation as well as burning.  He denies pain to his left arm, left jaw.  No associated diaphoresis, lightheaded.  Patient denies any recent Covid exposures.  Was seen by his PCP earlier today given nitro and aspirin.  Patient states his pain was originally an 8 which was brought down to a 2 after nitro and aspirin.  Patient states his pain is currently a 3.  He has had some intermittent dizziness.  He is followed by cardiology, Dr. Harrell Gave for hypertension and Mobitz block.  His hydralazine was recently increased to 40 mg 3 times daily due to persistently elevated blood pressures.  Was initially on Cardizem however this was stopped due to AV block.  Patient was supposed to have exercise stress test however this was deferred to get better blood pressure control.  He was supposed to follow-up with Pharm.D. hypertension clinic however he did not return for this.  Does admit to some occasional epigastric pain.  No shortness of breath, cough, fever, chills, nausea, vomiting, abdominal pain, unilateral weakness.  No syncope.  He has occasional palpitations.  No recent weight gain, PND orthopnea.  Per cardiology's note patient has  had some left breast burning and sharp pain with tenderness.  ECHO 01/28/18 EF 55-60  Per cardiology last EKG on 02/20/2018 with second-degree AV block type I  HPI     Past Medical History:  Diagnosis Date  . Colon polyps   . COPD (chronic obstructive pulmonary disease) ([place])   . Echocardiogram abnormal   . Ganglion cyst   . Hematuria   . Hyperlipidemia   . Hypertension   . Systolic murmur     Patient Active Problem List   Diagnosis Date Noted  . BPH (benign prostatic hyperplasia) 05/23/2018  . 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 Vitamin D deficiency 07/17/2014  . Hypertension   . COPD (chronic obstructive pulmonary disease) ([place])   . Hyperlipidemia   . Colon polyps   . Systolic murmur   . Ganglion cyst     Past Surgical History:  Procedure Laterality Date  . COLON SURGERY    . TRANSTHORACIC ECHOCARDIOGRAM         Family History  Problem Relation Age of Onset  . Diabetes Mother   . Stroke Brother     Social History   Tobacco Use  . Smoking status: Former Smoker    Quit date: 10/18/1981    Years since quitting: 37.3  . Smokeless tobacco: Never Used  Substance Use Topics  . Alcohol use: No  . Drug use: No    Home Medications Prior to Admission medications   Medication Sig Start Date End Date Taking? Authorizing Provider  aspirin EC 81 MG tablet Take 1 tablet (81 mg total) by mouth daily. 01/12/19   Buford Dresser, MD  benazepril (LOTENSIN) 20 MG tablet Take 1 tablet (20 mg total) by mouth daily. Along with combination dose of benazepril-hydrochlorothiazide 20-25 mg 01/15/19   Buford Dresser, MD  benazepril-hydrochlorthiazide (LOTENSIN HCT) 20-25 MG tablet Take 1 tablet by mouth daily. 05/23/18   Alycia Rossetti, MD  Cholecalciferol (VITAMIN D) 2000 UNITS CAPS Take 1 capsule (2,000 Units total) by mouth daily. 07/17/14   Orlena Sheldon, PA-C  finasteride (PROSCAR) 5 MG tablet Take 1 tablet by mouth daily 05/23/18   Alycia Rossetti, MD  hydrALAZINE (APRESOLINE) 50 MG tablet Take 1 tablet (50 mg total) by mouth 3 (three) times daily. 01/12/19 04/12/19  Buford Dresser, MD  terazosin (HYTRIN) 10 MG capsule Take 1 capsule by mouth daily  HS 05/23/18   [place], Modena Nunnery, MD    Allergies    Amlodipine, Lipitor [atorvastatin calcium], Zocor [simvastatin], Bactrim [sulfamethoxazole-trimethoprim], and Pravachol  Review of Systems   Review of Systems  Constitutional: Negative.   HENT: Negative.   Respiratory: Negative.   Cardiovascular: Positive for chest pain. Negative for palpitations and leg swelling.  Gastrointestinal: Negative.   Genitourinary: Negative.   Musculoskeletal: Negative.   Skin: Negative.   Neurological: Negative.   All other systems reviewed and are negative.   Physical Exam Updated Vital Signs BP (!) 180/90   Pulse 75   Temp 98.2 F (36.8 C) (Oral)   Resp 11   Ht 5\' 6"  (1.676 m)   Wt 88.9 kg   SpO2 98%   BMI 31.64 kg/m   Physical Exam Vitals and nursing note reviewed.  Constitutional:      General: He is not in acute distress.    Appearance: He is well-developed. He is not ill-appearing or diaphoretic.  HENT:     Head: Normocephalic and atraumatic.     Jaw: There is normal jaw occlusion.     Nose: Nose normal.  Eyes:     Extraocular Movements: Extraocular movements intact.     Pupils: Pupils are equal, round, and reactive to light.     Comments: EOMs intact.  Neck:     Vascular: No carotid bruit or JVD.     Trachea: Trachea and phonation normal.     Meningeal: Brudzinski's sign and Kernig's sign absent.     Comments: No nystagmus.  Pupils equal reactive to light. Cardiovascular:     Rate and Rhythm: Normal rate and regular rhythm.     Pulses: Normal pulses.          Radial pulses are 2+ on the right side and 2+ on the left side.       Dorsalis pedis pulses are 2+ on the right side and 2+ on the left side.       Posterior tibial pulses are 2+ on the right side  and 2+ on the left side.     Heart sounds: Normal heart sounds.  Pulmonary:     Effort: Pulmonary effort is normal. No respiratory distress.     Breath sounds: Normal breath sounds and air entry.     Comments: Clear to auscultation bilateral wheeze, rhonchi or rales.  Speaks in full sentences without difficulty. Chest:     Chest wall: No deformity, swelling, tenderness, crepitus or edema.       Comments: Chest wall tenderness to bilateral breast tissue. Abdominal:     General: There is no distension.  Palpations: Abdomen is soft.     Tenderness: There is no abdominal tenderness. There is no right CVA tenderness, left CVA tenderness, guarding or rebound.     Hernia: No hernia is present.     Comments: Soft, nontender.  No pulsatile abdominal mass.  Musculoskeletal:        General: Normal range of motion.     Cervical back: Full passive range of motion without pain, normal range of motion and neck supple.     Right lower leg: No edema.     Left lower leg: No edema.     Comments: Moves all 4 extremities at difficulty.  Compartments soft.  No evidence of DVT on exam.  Homans' sign negative  Feet:     Right foot:     Skin integrity: Skin integrity normal.     Left foot:     Skin integrity: Skin integrity normal.  Skin:    General: Skin is warm and dry.     Capillary Refill: Capillary refill takes less than 2 seconds.     Comments: Brisk capillary refill.  No rashes or lesions.  Neurological:     General: No focal deficit present.     Mental Status: He is alert and oriented to person, place, and time.     Cranial Nerves: Cranial nerves are intact.     Sensory: Sensation is intact.     Motor: Motor function is intact.     Coordination: Coordination is intact.     Gait: Gait is intact.     Comments: Ambulatory without difficulty.    ED Results / Procedures / Treatments   Labs (all labs ordered are listed, but only abnormal results are displayed) Labs Reviewed  CBC WITH  DIFFERENTIAL/PLATELET - Abnormal; Notable for the following components:      Result Value   Hemoglobin 12.2 (*)    All other components within normal limits  BASIC METABOLIC PANEL - Abnormal; Notable for the following components:   GFR calc non Af Amer 57 (*)    All other components within normal limits  TROPONIN I (HIGH SENSITIVITY) - Abnormal; Notable for the following components:   Troponin I (High Sensitivity) 19 (*)    All other components within normal limits  TROPONIN I (HIGH SENSITIVITY) - Abnormal; Notable for the following components:   Troponin I (High Sensitivity) 20 (*)    All other components within normal limits    EKG EKG Interpretation  Date/Time:  Friday February 02 2019 10:24:20 EST Ventricular Rate:  80 PR Interval:    QRS Duration: 113 QT Interval:  417 QTC Calculation: 482 R Axis:   -59 Text Interpretation: Second degree AV block, Mobitz II Incomplete RBBB and LAFB Anteroseptal infarct, age indeterminate New since previous tracing Confirmed by Fredia Sorrow [phone]) on 02/02/2019 10:28:54 AM   Radiology DG Chest 2 View  Result Date: 02/02/2019 CLINICAL DATA:  Chest pain. EXAM: CHEST - 2 VIEW COMPARISON:  01/27/2018. FINDINGS: Mediastinum hilar structures normal. Cardiomegaly with normal pulmonary vascularity. No focal infiltrate. No pleural effusion or pneumothorax. No acute bony abnormality identified. IMPRESSION: 1.  Cardiomegaly with normal pulmonary vascularity. 2.  No acute pulmonary disease. Electronically Signed   By: Marcello Moores  Register   On: 02/02/2019 10:59    Procedures Procedures (including critical care time)  Medications Ordered in ED Medications  hydrALAZINE (APRESOLINE) tablet 50 mg (has no administration in time range)  morphine 4 MG/ML injection 4 mg (4 mg Intravenous Given 02/02/19 1124)  ED Course  I have reviewed the triage vital signs and the nursing notes.  Pertinent labs & imaging results that were available during my care of the  patient were reviewed by me and considered in my medical decision making (see chart for details).  86 year old male appears otherwise well presents for evaluation of chest pain.  Patient seen by PCP this morning, given nitro and aspirin which resolved his pain.  On arrival patient has minimal pain, or rates a 2/10.  Given morphine with resolve of pain.  Pain does not radiate into left arm, jaw, back.  He has no extremity paresthesias.  No associated diaphoresis, lightheadedness, dizziness, shortness of breath.  Equal pulses bilaterally.  He has a nonfocal neuro exam without deficits.  Patient states he does have a burning sensation in his right upper chest into his right shoulder however patient states he has had this for an extended period of time.  Patient describes his pain as a burning and a squeezing sensation to his left upper chest.  Pain began yesterday evening.  Plan for labs, imaging, EKG.  Clinical Course as of Feb 02 1556  Fri Feb 02, 2019  1045 Given ASA, nitro by PCP office PTA   [BH]  1216 Pain currently resolved   [BH]  1327 Troponin 19--20 on delta  Troponin I (High Sensitivity)(!) [BH]    Clinical Course User Index [BH] Teri Diltz A, PA-C   1200: Patient continues to be without any pain in the ED.  He has stable vital signs.  No tachycardia, tachypnea or hypoxia to suggest PE.  Low suspicion for dissection, myocarditis, pericarditis.  Discussed with Dr. Bobby Rumpf, attending physician who does not think patient needs a CT scan to rule out dissection or PE at this time.  His delta troponin has been flat at 19 and 20.  Per cardiology's note they have been trying to get him in for a stress test outpatient however patient has been lost to follow-up.  We will plan to consult with cardiology to determine disposition.  1515:Consult  with Dr. Meda Coffee with cardiology.  She recommends since negative delta troponins patient can follow-up outpatient for stress test on Monday. Does not  think patient needs admission at this time given CP free throughout ED stay.  He will begiven strict return precautions.  I have low suspicion for ACS, PE, dissection, myocarditis, pericarditis as cause of his symptoms.  He was given his home blood pressure medicines here in the ED.  He is supposed to be on hydralazine 3 times daily, 50 mg.  Patient has had intermittent hypertension here in the ED however according to cardiology and PCP note appears to be at his baseline at home.  Discussed with patient strict return precautions, follow-up with cardiology return to emergency department for any new or worsening symptoms.  He has continued to be chest pain-free for 5 hours since he has been in the ED.  Discussed plan with patient.  He would like to follow-up outpatient with I feel is reasonable after discussion with Dr. Meda Coffee with cardiology.  Attending physician, Dr. Rogene Houston has personally evaluated patient and agrees with above treatment, plan and disposition.   VSS, no tracheal deviation, no JVD or new murmur, RRR, breath sounds equal bilaterally, EKG without acute abnormalities, negative troponin, and negative CXR. Pt has been advised to return to the ED if CP becomes exertional, associated with diaphoresis or nausea, radiates to left jaw/arm, worsens or becomes concerning in any way. Pt appears reliable  for follow up and is agreeable to discharge.     MDM Rules/Calculators/A&P                      Final Clinical Impression(s) / ED Diagnoses Final diagnoses:  Precordial pain    Rx / DC Orders ED Discharge Orders    None       Ayinde Swim A, PA-C 02/02/19 1559    Fredia Sorrow, MD 02/04/19 [phone]

## 2019-02-02 NOTE — Assessment & Plan Note (Signed)
Patient was routed to [facility] for cardiac work-up.  He did respond to the nitroglycerin his pain went down to a 2.  His blood pressure also improved 20 improved 20 points.  Systolic was in the [id number] by the time EMS came.  Daughter was not notified and was at the bedside.

## 2019-02-02 NOTE — Patient Instructions (Signed)
To ER

## 2019-02-02 NOTE — ED Triage Notes (Signed)
Pt c/o L side CP starting last PM with radiation to R side arm with ":coild and hot" sensation.  Denies any SOB, N/V/D, cough or fever.  In by EMS from MD office ASA and Nitro given at MD office pain went from an 8-2.  Currently pain free, alert and oriented.  Pleasant and in NAD.

## 2019-02-02 NOTE — ED Notes (Signed)
Pt to x-ray with transporter .

## 2019-02-02 NOTE — ED Notes (Signed)
E-signature not available, verbalized understanding of DC instructions and follow up care.

## 2019-02-02 NOTE — Progress Notes (Signed)
Subjective:    Patient ID: Patrick Villa, male    DOB: 12/01/1933, 86 y.o.   MRN: [id number]  Patient presents for Chest Pain (x1 day- burning sensation to chest starting last night, dizziness, R arm pain)  Pt here with chest pain that started last night.  He was brought in by his daughter.  He states that he started having left-sided chest discomfort last night but then it started burning across towards the right side.  This morning he still has the pressure and pain in his chest but then he started having pain into his right arm.  He denies any shortness of breath he has not had any upper respiratory symptoms has not had any fever.  His blood pressure has been running up.  Yesterday he states before he started having the pain his blood pressure was 162/76.  He has had some cardiac work-up a year ago secondary to chest pain he has a known AV block as well as fascicular block.  He has been having difficulties with his blood pressure  His hydralazine was increased by cardiology to 52m TID on 12/18 due to persistant elevated blood pressures , his ASA was decreased to 81mg  due to risk factors   He has not had BP meds today/  Upon coming into the office he was complaining of substernal chest pressure with pain into the right arm.  EKG was placed showing sinus rhythm but with AV block fascicular block questionable old infarct and some repolarization noted in the for V3.  He was given nitroglycerin as well as aspirin 3 tablets as he had already had one baby aspirin this morning.  EMS was called.   Review Of Systems:  GEN- denies fatigue, fever, weight loss,weakness, recent illness HEENT- denies eye drainage, change in vision, nasal discharge, CVS- denies chest pain, palpitations RESP- denies SOB, cough, wheeze ABD- denies N/V, change in stools, abd pain GU- denies dysuria, hematuria, dribbling, incontinence MSK- denies joint pain, muscle aches, injury Neuro- denies headache, dizziness, syncope,  seizure activity       Objective:    BP (!) 184/92 (BP Location: Right Arm, Patient Position: Sitting, Cuff Size: Normal)   Pulse 94   Temp 98.7 F (37.1 C) (Temporal)   Resp 14   Ht 5\' 6"  (1.676 m)   Wt 196 lb (88.9 kg)   SpO2 99%   BMI 31.64 kg/m  GEN- NAD, alert and oriented x3 HEENT- PERRL, EOMI, non injected sclera, pink conjunctiva, MMM, oropharynx clear CVS- RRR, no murmur RESP-CTAB ABD-NABS,soft,NT,ND EXT- No edema Pulses- Radial, DP- 2+        Assessment & Plan:      Problem List Items Addressed This Visit      Unprioritized   Hypertension    Patient was routed to [facility] for cardiac work[facility]up.  He did respond to the nitroglycerin his pain went down to a 2.  His blood pressure also improved 20 improved 20 points.  Systolic was in the [id number] by the time EMS came.  Daughter was not notified and was at the bedside.      Second degree AV block, Mobitz type I    Other Visit Diagnoses    Chest pain, unspecified type    -  Primary   Chest pain concerning for unstable angina coronary artery disease.  He has had elevated blood pressures uncontrolled.   Relevant Medications   aspirin chewable tablet 243 mg (Completed)   nitroGLYCERIN (NITROSTAT) SL tablet 0.4 mg (  Completed)   Other Relevant Orders   EKG 12-Lead (Completed)      Note: This dictation was prepared with Dragon dictation along with smaller phrase technology. Any transcriptional errors that result from this process are unintentional.

## 2019-02-02 NOTE — Discharge Instructions (Signed)
You will be called for a stress test on Monday.  If you develop any worsening chest pain, shortness of breath, numbness to arms please seek reevaluation the emergency department

## 2019-02-02 NOTE — ED Notes (Signed)
On his cell

## 2019-02-05 ENCOUNTER — Telehealth (HOSPITAL_COMMUNITY): Payer: Self-pay

## 2019-02-05 NOTE — Telephone Encounter (Signed)
Attempted to contact the patient, on his cell phone and home phone. A message was on his cell phone to call us back with any questions. S.Giovoni Bunch EMTP

## 2019-02-05 NOTE — Telephone Encounter (Signed)
Patient given detailed instructions per Myocardial Perfusion Study Information Sheet for the test on 02/06/2019 at 7:15. Patient notified to arrive 15 minutes early and that it is imperative to arrive on time for appointment to keep from having the test rescheduled.  If you need to cancel or reschedule your appointment, please call the office within 24 hours of your appointment. . Patient verbalized understanding.ehk

## 2019-02-06 ENCOUNTER — Ambulatory Visit (HOSPITAL_COMMUNITY): Payer: Medicare Other | Attending: Internal Medicine

## 2019-02-06 ENCOUNTER — Other Ambulatory Visit: Payer: Self-pay

## 2019-02-06 DIAGNOSIS — R079 Chest pain, unspecified: Secondary | ICD-10-CM | POA: Diagnosis not present

## 2019-02-06 LAB — MYOCARDIAL PERFUSION IMAGING
LV dias vol: 163 mL (ref 62–150)
LV sys vol: 85 mL
Peak HR: 95 {beats}/min
Rest HR: 73 {beats}/min
SDS: 1
SRS: 0
SSS: 1
TID: 1.08

## 2019-02-06 MED ORDER — REGADENOSON 0.4 MG/5ML IV SOLN
0.4000 mg | Freq: Once | INTRAVENOUS | Status: AC
  Administered 2019-02-06: 0.4 mg via INTRAVENOUS

## 2019-02-06 MED ORDER — TECHNETIUM TC 99M TETROFOSMIN IV KIT
31.8000 | PACK | Freq: Once | INTRAVENOUS | Status: AC | PRN
  Administered 2019-02-06: 31.8 via INTRAVENOUS
  Filled 2019-02-06: qty 32

## 2019-02-06 MED ORDER — TECHNETIUM TC 99M TETROFOSMIN IV KIT
11.0000 | PACK | Freq: Once | INTRAVENOUS | Status: AC | PRN
  Administered 2019-02-06: 11 via INTRAVENOUS
  Filled 2019-02-06: qty 11

## 2019-02-11 ENCOUNTER — Encounter: Payer: Self-pay | Admitting: Cardiology

## 2019-02-15 ENCOUNTER — Ambulatory Visit (INDEPENDENT_AMBULATORY_CARE_PROVIDER_SITE_OTHER): Payer: Medicare Other | Admitting: Cardiology

## 2019-02-15 ENCOUNTER — Other Ambulatory Visit: Payer: Self-pay

## 2019-02-15 ENCOUNTER — Encounter: Payer: Self-pay | Admitting: Cardiology

## 2019-02-15 VITALS — BP 162/88 | HR 80 | Temp 97.0°F | Ht 66.0 in | Wt 195.4 lb

## 2019-02-15 DIAGNOSIS — I10 Essential (primary) hypertension: Secondary | ICD-10-CM | POA: Diagnosis not present

## 2019-02-15 DIAGNOSIS — Z79899 Other long term (current) drug therapy: Secondary | ICD-10-CM

## 2019-02-15 DIAGNOSIS — I441 Atrioventricular block, second degree: Secondary | ICD-10-CM | POA: Diagnosis not present

## 2019-02-15 DIAGNOSIS — I1A0 Resistant hypertension: Secondary | ICD-10-CM

## 2019-02-15 MED ORDER — BENAZEPRIL HCL 40 MG PO TABS: 40.0000 mg | ORAL_TABLET | Freq: Every day | ORAL | 3 refills | Status: DC

## 2019-02-15 MED ORDER — HYDRALAZINE HCL 100 MG PO TABS: 100.0000 mg | ORAL_TABLET | Freq: Two times a day (BID) | ORAL | 3 refills | Status: DC

## 2019-02-15 MED ORDER — CHLORTHALIDONE 25 MG PO TABS: 25.0000 mg | ORAL_TABLET | Freq: Every day | ORAL | 3 refills | Status: DC

## 2019-02-15 NOTE — Progress Notes (Signed)
Cardiology Office Note:    Date:  02/15/2019   ID:  Patrick Villa, DOB April 22, 1933, MRN [id number]  PCP:  Alycia Rossetti, MD  Cardiologist:  Buford Dresser, MD PhD  Referring MD: Alycia Rossetti, MD   CC: follow up of hypertension  History of Present Illness:    Patrick Villa is a 86 y.o. male with a hx of COPD, hyperlipidemia, hypertension, second degree AV block type I who is seen in follow up today. I initially saw him as a new consult 01/2018 at the request of [place], Modena Nunnery, MD for the evaluation and management of second degree, type I heart block.   He was seen in the hospital on 01/27/18 and 01/28/18 by Drs. Fredrik Rigger for bilateral breast tenderness. At that time, troponins were negative, echo was unremarkable. Cardizem was stopped due to second degree AV block, type I.   When he first saw me, we discussed an exercise treadmill stress test, but first goal was to get BP under better control. He initially met with our PharmD HTN clinic for this, but he did not return for follow up.   Today: Feeling well overall, got first Covid shot yesterday.   He presented to ER with chest pain 02/02/19, ER notes reviewed. He reports it was a worsening of his chronic tender chest wall pain. Is improved today.   Bp at home highly variable. Lowest 144, highest 299 systolic. Worst after dinner, does admit to salty snacks after dinner (peanuts, popcorn).   We reviewed medication options and plan today, see summary below. He does admit that he has only been taking hydralazine BID most of the time, rarely TID.   Denies shortness of breath at rest or with normal exertion. No PND, orthopnea, LE edema or unexpected weight gain. No syncope or palpitations.  Past Medical History:  Diagnosis Date  . Colon polyps   . COPD (chronic obstructive pulmonary disease) ([place])   . Echocardiogram abnormal   . Ganglion cyst   . Hematuria   . Hyperlipidemia   . Hypertension   . Systolic murmur      Past Surgical History:  Procedure Laterality Date  . COLON SURGERY    . TRANSTHORACIC ECHOCARDIOGRAM      Current Medications: Current Outpatient Medications on File Prior to Visit  Medication Sig  . aspirin EC 81 MG tablet Take 1 tablet (81 mg total) by mouth daily.  . Cholecalciferol (VITAMIN D) 2000 UNITS CAPS Take 1 capsule (2,000 Units total) by mouth daily.  . finasteride (PROSCAR) 5 MG tablet Take 1 tablet by mouth daily  . terazosin (HYTRIN) 10 MG capsule Take 1 capsule by mouth daily  HS   No current facility-administered medications on file prior to visit.     Allergies:   Amlodipine, Lipitor [atorvastatin calcium], Zocor [simvastatin], Bactrim [sulfamethoxazole-trimethoprim], and Pravachol   Social History   Tobacco Use  . Smoking status: Former Smoker    Quit date: 10/18/1981    Years since quitting: 37.3  . Smokeless tobacco: Never Used  Substance Use Topics  . Alcohol use: No  . Drug use: No    Family History: The patient's family history includes Diabetes in his mother; Stroke in his brother.  ROS:   Please see the history of present illness.  Additional pertinent ROS negative except as documented.    EKGs/Labs/Other Studies Reviewed:    The following studies were reviewed today: Echo 01/28/18 - Left ventricle: The cavity size was normal. Wall  thickness was   normal. Systolic function was normal. The estimated ejection   fraction was in the range of 55% to 60%. Wall motion was normal;   there were no regional wall motion abnormalities. The study is   not technically sufficient to allow evaluation of LV diastolic   function. - Atrial septum: There was an atrial septal aneurysm.  Impressions:  - Normal LV function; sclerotic aortic valve.  EKG:  EKG is personally reviewed.  The ekg ordered 02/02/19 demonstrates sinus rhythm, 2nd degree AV block type 1, LAFB, PRWP  Recent Labs: 08/25/2018: ALT 18 02/02/2019: BUN 18; Creatinine, Ser 1.17;  Hemoglobin 12.2; Platelets 214; Potassium 3.7; Sodium 138  Recent Lipid Panel    Component Value Date/Time   CHOL 147 01/28/2018 0538   TRIG 90 01/28/2018 0538   HDL 31 (L) 01/28/2018 0538   CHOLHDL 4.7 01/28/2018 0538   VLDL 18 01/28/2018 0538   LDLCALC 98 01/28/2018 0538    Physical Exam:    VS:  BP (!) 162/88   Pulse 80   Temp (!) 97 F (36.1 C)   Ht 5' 6"  (1.676 m)   Wt 195 lb 6.4 oz (88.6 kg)   SpO2 100%   BMI 31.54 kg/m     Wt Readings from Last 3 Encounters:  02/15/19 195 lb 6.4 oz (88.6 kg)  02/06/19 196 lb (88.9 kg)  02/02/19 196 lb (88.9 kg)    GEN: Well nourished, well developed in no acute distress HEENT: Normal, moist mucous membranes NECK: No JVD CARDIAC: regular rhythm, normal S1 and S2, no rubs or gallops. 1/6 SE murmur. VASCULAR: Radial and DP pulses 2+ bilaterally. No carotid bruits RESPIRATORY:  Clear to auscultation without rales, wheezing or rhonchi  ABDOMEN: Soft, non-tender, non-distended MUSCULOSKELETAL:  Ambulates independently SKIN: Warm and dry, no edema NEUROLOGIC:  Alert and oriented x 3. No focal neuro deficits noted. PSYCHIATRIC:  Normal affect   ASSESSMENT:    1. Resistant hypertension   2. Essential hypertension   3. Medication management   4. Second degree AV block, Mobitz type I    PLAN:    Hypertension: remains elevated above goal. Has been resistant to good doses of medications across multiple classes. -We discussed significant changes today. These are summarized below: STOP Benazepril 20-hydrochlorothiazide 25 mg  STOP Benazepril 20 mg (extra dose) START Benazepril 40 mg daily (same dose, new pill).  START Chlorthalidone (replaces the hydrochlorothiazide). INCREASE Hydralazine to 100 mg at least twice a day, was 50 mg three times a day.  -tried amlodipine, had jitteriness/tremors. Had chest wall pain on spironolactone.  -no diltiazem/verapamil, beta blocker due to second degree AV block type 1 ([place]enkebach) -is on  terazosin as well  Aspirin use: will discuss discontinuing next visit given age, no ischemia on stress test  Murmur: aortic sclerosis without stenosis, no further workup needed. Echo 01/2018  History of chest discomfort: longstanding, atypical. Lexiscan normal. He will discuss with his PCP  Plan for follow up: 2 mos or sooner PRN if he has issues or blood pressure does not improve  Total time of encounter: 35 minutes total time of encounter, including 30 minutes spent in face-to-face patient care. This time includes coordination of care and counseling regarding medication options, changes, teachback. Remainder of non-face-to-face time involved reviewing chart documents/testing relevant to the patient encounter and documentation in the medical record.  Buford Dresser, MD, PhD [place]  [facility]   Medication Adjustments/Labs and Tests Ordered: Current medicines are reviewed at length  with the patient today.  Concerns regarding medicines are outlined above.  No orders of the defined types were placed in this encounter.  Meds ordered this encounter  Medications  . benazepril (LOTENSIN) 40 MG tablet    Sig: Take 1 tablet (40 mg total) by mouth daily.    Dispense:  90 tablet    Refill:  3  . hydrALAZINE (APRESOLINE) 100 MG tablet    Sig: Take 1 tablet (100 mg total) by mouth 2 (two) times daily.    Dispense:  180 tablet    Refill:  3    Pt will call at time of refill  . chlorthalidone (HYGROTON) 25 MG tablet    Sig: Take 1 tablet (25 mg total) by mouth daily.    Dispense:  90 tablet    Refill:  3    Patient will call when ready for this, finishing prior prescription first.    Patient Instructions  Medication Instructions:  STOP Benazepril 20-hydrochlorothiazide 25 mg  STOP Benazepril 20 mg (extra dose) START Benazepril 40 mg daily (same dose, new pill).  START Chlorthalidone (replaces the hydrochlorothiazide). INCREASE Hydralazine to 100 mg at least twice a day,  was 50 mg three times a day. *If you need a refill on your cardiac medications before your next appointment, please call your pharmacy*  Lab Work: None  If you have labs (blood work) drawn today and your tests are completely normal, you will receive your results only by: Marland Kitchen MyChart Message (if you have MyChart) OR . A paper copy in the mail If you have any lab test that is abnormal or we need to change your treatment, we will call you to review the results.  Testing/Procedures: None   Follow-Up: At [facility], you and your health needs are our priority.  As part of our continuing mission to provide you with exceptional heart care, we have created designated Provider Care Teams.  These Care Teams include your primary Cardiologist (physician) and Advanced Practice Providers (APPs -  Physician Assistants and Nurse Practitioners) who all work together to provide you with the care you need, when you need it.  Your next appointment:   2 month(s)  The format for your next appointment:   Virtual Visit   Provider:   Buford Dresser, MD  Other Instructions     Signed, Buford Dresser, MD PhD 02/15/2019    [place]

## 2019-02-15 NOTE — Patient Instructions (Addendum)
Medication Instructions:  STOP Benazepril 20-hydrochlorothiazide 25 mg  STOP Benazepril 20 mg (extra dose) START Benazepril 40 mg daily (same dose, new pill).  START Chlorthalidone (replaces the hydrochlorothiazide). INCREASE Hydralazine to 100 mg at least twice a day, was 50 mg three times a day. *If you need a refill on your cardiac medications before your next appointment, please call your pharmacy*  Lab Work: None  If you have labs (blood work) drawn today and your tests are completely normal, you will receive your results only by: Marland Kitchen MyChart Message (if you have MyChart) OR . A paper copy in the mail If you have any lab test that is abnormal or we need to change your treatment, we will call you to review the results.  Testing/Procedures: None   Follow-Up: At [facility], you and your health needs are our priority.  As part of our continuing mission to provide you with exceptional heart care, we have created designated Provider Care Teams.  These Care Teams include your primary Cardiologist (physician) and Advanced Practice Providers (APPs -  Physician Assistants and Nurse Practitioners) who all work together to provide you with the care you need, when you need it.  Your next appointment:   2 month(s)  The format for your next appointment:   Virtual Visit   Provider:   Buford Dresser, MD  Other Instructions

## 2019-02-26 ENCOUNTER — Encounter: Payer: Self-pay | Admitting: Family Medicine

## 2019-02-26 ENCOUNTER — Ambulatory Visit (INDEPENDENT_AMBULATORY_CARE_PROVIDER_SITE_OTHER): Payer: Medicare Other | Admitting: Family Medicine

## 2019-02-26 ENCOUNTER — Other Ambulatory Visit: Payer: Self-pay

## 2019-02-26 VITALS — BP 192/88 | HR 82 | Temp 98.2°F | Resp 14 | Ht 66.0 in | Wt 198.0 lb

## 2019-02-26 DIAGNOSIS — I441 Atrioventricular block, second degree: Secondary | ICD-10-CM | POA: Diagnosis not present

## 2019-02-26 DIAGNOSIS — N400 Enlarged prostate without lower urinary tract symptoms: Secondary | ICD-10-CM

## 2019-02-26 DIAGNOSIS — E7800 Pure hypercholesterolemia, unspecified: Secondary | ICD-10-CM

## 2019-02-26 DIAGNOSIS — I10 Essential (primary) hypertension: Secondary | ICD-10-CM

## 2019-02-26 LAB — BASIC METABOLIC PANEL
BUN/Creatinine Ratio: 12 (calc) (ref 6–22)
BUN: 15 mg/dL (ref 7–25)
CO2: 30 mmol/L (ref 20–32)
Calcium: 9.7 mg/dL (ref 8.6–10.3)
Chloride: 106 mmol/L (ref 98–110)
Creat: 1.27 mg/dL — ABNORMAL HIGH (ref 0.70–1.11)
Glucose, Bld: 121 mg/dL — ABNORMAL HIGH (ref 65–99)
Potassium: 4.3 mmol/L (ref 3.5–5.3)
Sodium: 141 mmol/L (ref 135–146)

## 2019-02-26 MED ORDER — CLONIDINE HCL 0.1 MG PO TABS
0.1000 mg | ORAL_TABLET | Freq: Once | ORAL | Status: AC
  Administered 2019-02-26: 0.1 mg via ORAL

## 2019-02-26 MED ORDER — HYDRALAZINE HCL 50 MG PO TABS: 50.0000 mg | ORAL_TABLET | Freq: Three times a day (TID) | ORAL | 2 refills | Status: DC

## 2019-02-26 NOTE — Progress Notes (Signed)
Subjective:    Patient ID: Patrick Villa, male    DOB: 03/23/1933, 86 y.o.   MRN: [id number]  Patient presents for Follow-up (is not fasting)   Pt here for intermin f/u on chronic medical problems He has uncontrolled HTN,with episodes of chest pain  At our last visit he had elevated BP with chest pain, given NTG and sent to ER for evaliation Cardiology  Decided to proceed with outpatient work up   His last visit with cardiology was on  1/21 Dr. Norlene Campbell.. BP at that visit was 162/88 He has AV block as well. His cardizem was stopped due to the AV block He is not very compllaint with the hydralzaine, states he has only been taking 50mg  BID, prescribed 100mg  TID He is also on proscar for his BPH and hyrin 10mg   At his last visit he was changed to Benezapril 40mg  once a day , and hydralazine 100mg  BID   - He sates he tried hydralizine 100mg  and it caused his chest to hurt so he stopped taking it and went back to 50mg  BP readings from home-  120-170/75-90 hr 70-80 He admits he had spaghettti and chips and salsa and Bp at home this AM was 172/90 He denies any Chest pain, SOB, HA, dizzy spells    Note he has tried multiple other meds inclusing norvasc, adldactone, no BB or cardizem due to AV block   Review Of Systems:  GEN- denies fatigue, fever, weight loss,weakness, recent illness HEENT- denies eye drainage, change in vision, nasal discharge, CVS- denies chest pain, palpitations RESP- denies SOB, cough, wheeze ABD- denies N/V, change in stools, abd pain GU- denies dysuria, hematuria, dribbling, incontinence MSK- denies joint pain, muscle aches, injury Neuro- denies headache, dizziness, syncope, seizure activity       Objective:    BP (!) 192/88   Pulse 82   Temp 98.2 F (36.8 C) (Temporal)   Resp 14   Ht 5\' 6"  (1.676 m)   Wt 198 lb (89.8 kg)   SpO2 97%   BMI 31.96 kg/m  GEN- NAD, alert and oriented x3, well appearing  HEENT- PERRL, EOMI, non injected sclera, pink  conjunctiva, MMM, oropharynx clear Neck- Supple, no thyromegaly CVS- RRR, no murmur RESP-CTAB ABD-NABS,soft,NT,ND EXT- No edema Pulses- Radial 2+        Assessment & Plan:      Problem List Items Addressed This Visit      Unprioritized   BPH (benign prostatic hyperplasia)   Hyperlipidemia    Plan for lipid panel on Friday      Relevant Medications   hydrALAZINE (APRESOLINE) 50 MG tablet   Second degree AV block, Mobitz type I - Primary   Relevant Medications   hydrALAZINE (APRESOLINE) 50 MG tablet   Other Relevant Orders   Basic metabolic panel   Uncontrolled hypertension    Blood  Pressure is uncontrolled, compliance with medication is an issue Discussed importance of his meds and the limited choices we have due to allergies, intolerance and his heart block Given clonidine 0.1mg  in the office with systolic > [id number] He was asymptomatic BP did slowly improve after sitting in office for another 30 minutes BMET also drawn Discussed dietary changes, reducing sodium in diet as well,a voiding fried foods, pork products  Will have him go back to 50mg  TID of hydralazine Continue benezapril 40mg  Terazosin 10mg   Recheck BP on Friday       Relevant Medications   hydrALAZINE (APRESOLINE) 50 MG tablet  Note: This dictation was prepared with Dragon dictation along with smaller phrase technology. Any transcriptional errors that result from this process are unintentional.

## 2019-02-26 NOTE — Assessment & Plan Note (Signed)
Plan for lipid panel on Friday

## 2019-02-26 NOTE — Assessment & Plan Note (Addendum)
Blood  Pressure is uncontrolled, compliance with medication is an issue Discussed importance of his meds and the limited choices we have due to allergies, intolerance and his heart block Given clonidine 0.1mg  in the office with systolic > [id number] He was asymptomatic BP did slowly improve after sitting in office for another 30 minutes BMET also drawn Discussed dietary changes, reducing sodium in diet as well,a voiding fried foods, pork products  Will have him go back to 50mg  TID of hydralazine Continue benezapril 40mg  Terazosin 10mg   Recheck BP on Friday

## 2019-02-26 NOTE — Patient Instructions (Signed)
F/U Friday for blood pressure- come fasting for labs

## 2019-03-02 ENCOUNTER — Other Ambulatory Visit: Payer: Self-pay

## 2019-03-02 ENCOUNTER — Ambulatory Visit (INDEPENDENT_AMBULATORY_CARE_PROVIDER_SITE_OTHER): Payer: Medicare Other | Admitting: Family Medicine

## 2019-03-02 ENCOUNTER — Encounter: Payer: Self-pay | Admitting: Family Medicine

## 2019-03-02 VITALS — BP 168/90 | HR 91 | Temp 98.0°F | Resp 16 | Ht 66.0 in | Wt 192.2 lb

## 2019-03-02 DIAGNOSIS — E7800 Pure hypercholesterolemia, unspecified: Secondary | ICD-10-CM | POA: Diagnosis not present

## 2019-03-02 DIAGNOSIS — I441 Atrioventricular block, second degree: Secondary | ICD-10-CM | POA: Diagnosis not present

## 2019-03-02 DIAGNOSIS — I10 Essential (primary) hypertension: Secondary | ICD-10-CM

## 2019-03-02 MED ORDER — CHLORTHALIDONE 25 MG PO TABS: 12.5000 mg | ORAL_TABLET | Freq: Every day | ORAL | 3 refills | Status: DC

## 2019-03-02 NOTE — Patient Instructions (Addendum)
F/U 3 weeks for blood pressure  Continue taking all the  Blood pressure medication Change the chlorthalidone to 1/2 tablet once a day

## 2019-03-02 NOTE — Progress Notes (Signed)
Subjective:    Patient ID: Patrick Villa, male    DOB: 09/15/1933, 86 y.o.   MRN: [id number]  Patient presents for Hypertension  Patient here for interim follow-up on his blood pressure.  He was seen a couple days ago his blood pressure was significantly elevated that he was asymptomatic not taking his blood pressure medications as prescribed by cardiology.  He felt like the 100 mg of hydralazine caused him to have chest discomfort so he was only taking 50 mg twice a day. Last visit he was given clonidine in the office if his systolic blood pressure was greater than 200 this did slowly come down.  I had him go back on 50 mg of hydralazine 3 times a day he was continued on benazepril 40 mg once a day Terazosin 10 mg once a day His metabolic panel was done his creatinine was mildly elevated at 1.27 as this is still in his baseline range/ BUN 15. He wsa not taking chlorthalidone at the last visit   He has been checking his Bp yesterday [id number] systolic, this AM  0000000 He states he has been urinating a lot. He had not been taking the chlorthalidone consistently only a few times, but started again this week. He had watery eyes and a mild headache , but no fever, cough, sneezing , facial pain He states it was the water pill he also had some leg cramps      Review Of Systems:  GEN- denies fatigue, fever, weight loss,weakness, recent illness HEENT-+ eye drainage,  Denies change in vision, nasal discharge, CVS- denies chest pain, palpitations RESP- denies SOB, cough, wheeze ABD- denies N/V, change in stools, abd pain GU- denies dysuria, hematuria, dribbling, incontinence MSK- denies joint pain,+ muscle aches, injury Neuro- + headache, dizziness, syncope, seizure activity       Objective:    BP (!) 168/90   Pulse 91   Temp 98 F (36.7 C) (Temporal)   Resp 16   Ht 5\' 6"  (1.676 m)   Wt 192 lb 4 oz (87.2 kg)   SpO2 99%   BMI 31.03 kg/m  GEN- NAD, alert and oriented x3 , well appearing   HEENT- PERRL., EOMI, non icteric,nares clear CVS- RRR, no murmur RESP-CTAB Neuro-CNII-XII in tact no focal deficits  EXT- No edema Pulses- Radial 2+        Assessment & Plan:      Problem List Items Addressed This Visit      Unprioritized   Hyperlipidemia   Relevant Medications   chlorthalidone (HYGROTON) 25 MG tablet   Other Relevant Orders   Lipid panel   Second degree AV block, Mobitz type I   Relevant Medications   chlorthalidone (HYGROTON) 25 MG tablet   Uncontrolled hypertension - Primary    Blood pressure has improved  Continue current regimen with change to chlorthalidone We can get him consistent on his regimen I think that his blood pressure will stabilize.  We will likely keep him but going a systolic of 1 [id number] diastolic [id number]. We will have him decrease the chlorthalidone to 12.5 mg once a day to help with compliance.  This will also help with urinating so much unlikely the cramps.  I will recheck his metabolic panel to make sure that he did not have any hypokalemia with the medication.  He may have some allergies with regards to the watery eyes but have benign examination today.  Georgina Peer have him follow back up in 3 weeks for  his blood pressure.  He is due for lipid panel today.      Relevant Medications   chlorthalidone (HYGROTON) 25 MG tablet   Other Relevant Orders   Basic metabolic panel      Note: This dictation was prepared with Dragon dictation along with smaller phrase technology. Any transcriptional errors that result from this process are unintentional.

## 2019-03-02 NOTE — Assessment & Plan Note (Addendum)
Blood pressure has improved  Continue current regimen with change to chlorthalidone We can get him consistent on his regimen I think that his blood pressure will stabilize.  We will likely keep him but going a systolic of 1 [id number] diastolic [id number]. We will have him decrease the chlorthalidone to 12.5 mg once a day to help with compliance.  This will also help with urinating so much unlikely the cramps.  I will recheck his metabolic panel to make sure that he did not have any hypokalemia with the medication.  He may have some allergies with regards to the watery eyes but have benign examination today.  Georgina Peer have him follow back up in 3 weeks for his blood pressure.  He is due for lipid panel today.

## 2019-03-03 LAB — BASIC METABOLIC PANEL
BUN/Creatinine Ratio: 16 (calc) (ref 6–22)
BUN: 19 mg/dL (ref 7–25)
CO2: 25 mmol/L (ref 20–32)
Calcium: 10.2 mg/dL (ref 8.6–10.3)
Chloride: 103 mmol/L (ref 98–110)
Creat: 1.21 mg/dL — ABNORMAL HIGH (ref 0.70–1.11)
Glucose, Bld: 97 mg/dL (ref 65–99)
Potassium: 3.9 mmol/L (ref 3.5–5.3)
Sodium: 141 mmol/L (ref 135–146)

## 2019-03-03 LAB — LIPID PANEL
Cholesterol: 178 mg/dL (ref <200)
HDL: 43 mg/dL (ref >=40)
LDL Cholesterol (Calc): 121 mg/dL (calc) — ABNORMAL HIGH
Non-HDL Cholesterol (Calc): 135 mg/dL (calc) — ABNORMAL HIGH (ref <130)
Total CHOL/HDL Ratio: 4.1 (calc) (ref <5.0)
Triglycerides: 53 mg/dL (ref <150)

## 2019-03-06 ENCOUNTER — Encounter: Payer: Self-pay | Admitting: *Deleted

## 2019-03-14 ENCOUNTER — Telehealth: Payer: Medicare Other | Admitting: Cardiology

## 2019-03-27 ENCOUNTER — Ambulatory Visit (INDEPENDENT_AMBULATORY_CARE_PROVIDER_SITE_OTHER): Payer: Medicare Other | Admitting: Family Medicine

## 2019-03-27 ENCOUNTER — Other Ambulatory Visit: Payer: Self-pay

## 2019-03-27 ENCOUNTER — Encounter: Payer: Self-pay | Admitting: Family Medicine

## 2019-03-27 DIAGNOSIS — I10 Essential (primary) hypertension: Secondary | ICD-10-CM

## 2019-03-27 DIAGNOSIS — N522 Drug-induced erectile dysfunction: Secondary | ICD-10-CM

## 2019-03-27 DIAGNOSIS — N529 Male erectile dysfunction, unspecified: Secondary | ICD-10-CM | POA: Insufficient documentation

## 2019-03-27 MED ORDER — CHLORTHALIDONE 25 MG PO TABS: 25.0000 mg | ORAL_TABLET | Freq: Every day | ORAL | 3 refills | Status: DC

## 2019-03-27 MED ORDER — SILDENAFIL CITRATE 20 MG PO TABS: ORAL_TABLET | ORAL | 1 refills | Status: DC

## 2019-03-27 NOTE — Patient Instructions (Signed)
F/U 4 months  

## 2019-03-27 NOTE — Assessment & Plan Note (Signed)
Erectile dysfunction I think that this is multifactorial he has known BPH he also has uncontrolled hypertension his age.  He wants better quality of life therefore I have given him sildenafil 20 mg he can take up to 60 mg 1 hour before intercourse discussed the side effects of this medication.

## 2019-03-27 NOTE — Progress Notes (Signed)
   Subjective:    Patient ID: Patrick Villa, male    DOB: 1933-12-30, 86 y.o.   MRN: [id number]  Patient presents for Follow-up (is not fasting)  Patient here for interim follow-up on his blood pressure.  At her last visit he was not taking his blood pressure medicines as described by cardiology. Today he states that he went back to hydralazine 100 mg but is taking this twice a day consistently.  He increase the chlorthalidone back to 25 mg daily and he is taking benazepril 40 mg but on occasion he had taken extra 20 mg.  He is also still taking his Terazosin 10 mg at bedtime.  He feels great today.  Has no complaints of chest pain shortness of breath dizziness.  His blood pressure readings over the past 2 weeks have ranged from 138-164/69-88 on average 130s to 140s his heart rate has been 70s to 80s.  His other concern is erectile dysfunction.  States he has difficulty sustaining his erection and getting them.  He would like to try medication to help.  Advised I would not recommend coming off of any of his blood pressure medications but they may be contributing to some of the issue.    Review Of Systems:  GEN- denies fatigue, fever, weight loss,weakness, recent illness HEENT- denies eye drainage, change in vision, nasal discharge, CVS- denies chest pain, palpitations RESP- denies SOB, cough, wheeze ABD- denies N/V, change in stools, abd pain GU- denies dysuria, hematuria, dribbling, incontinence MSK- denies joint pain, muscle aches, injury Neuro- denies headache, dizziness, syncope, seizure activity       Objective:    BP (!) 150/64 (BP Location: Right Arm, Patient Position: Sitting, Cuff Size: Normal)   Pulse 90   Temp 97.9 F (36.6 C) (Temporal)   Resp 16   Ht 5\' 6"  (1.676 m)   Wt 192 lb (87.1 kg)   SpO2 98%   BMI 30.99 kg/m  GEN- NAD, alert and oriented x3 CVS- RRR, no murmur RESP-CTAB EXT- No edema Pulses- Radial, 2+        Assessment & Plan:      Problem List  Items Addressed This Visit      Unprioritized   ED (erectile dysfunction)    Erectile dysfunction I think that this is multifactorial he has known BPH he also has uncontrolled hypertension his age.  He wants better quality of life therefore I have given him sildenafil 20 mg he can take up to 60 mg 1 hour before intercourse discussed the side effects of this medication.      Uncontrolled hypertension    Overall his average blood pressure has improved with his current regimen.  Unfortunately he had to change it again so this difficulty really tell when the improvement occurred for now he will continue hydralazine 100 mg twice a day.  Benazepril maximum 40 mg once a day Terazosin 10 mg at bedtime which also helps his BPH symptoms as well as chlorthalidone 25 mg once a day      Relevant Medications   hydrALAZINE (APRESOLINE) 100 MG tablet   chlorthalidone (HYGROTON) 25 MG tablet   sildenafil (REVATIO) 20 MG tablet      Note: This dictation was prepared with Dragon dictation along with smaller phrase technology. Any transcriptional errors that result from this process are unintentional.

## 2019-03-27 NOTE — Assessment & Plan Note (Signed)
Overall his average blood pressure has improved with his current regimen.  Unfortunately he had to change it again so this difficulty really tell when the improvement occurred for now he will continue hydralazine 100 mg twice a day.  Benazepril maximum 40 mg once a day Terazosin 10 mg at bedtime which also helps his BPH symptoms as well as chlorthalidone 25 mg once a day

## 2019-04-02 ENCOUNTER — Encounter: Payer: Self-pay | Admitting: Cardiology

## 2019-04-02 ENCOUNTER — Telehealth (INDEPENDENT_AMBULATORY_CARE_PROVIDER_SITE_OTHER): Payer: Medicare Other | Admitting: Cardiology

## 2019-04-02 VITALS — BP 145/69 | HR 80 | Ht 66.0 in | Wt 192.0 lb

## 2019-04-02 DIAGNOSIS — I441 Atrioventricular block, second degree: Secondary | ICD-10-CM | POA: Diagnosis not present

## 2019-04-02 DIAGNOSIS — I10 Essential (primary) hypertension: Secondary | ICD-10-CM | POA: Diagnosis not present

## 2019-04-02 DIAGNOSIS — Z87891 Personal history of nicotine dependence: Secondary | ICD-10-CM

## 2019-04-02 DIAGNOSIS — R011 Cardiac murmur, unspecified: Secondary | ICD-10-CM | POA: Diagnosis not present

## 2019-04-02 DIAGNOSIS — E7800 Pure hypercholesterolemia, unspecified: Secondary | ICD-10-CM | POA: Diagnosis not present

## 2019-04-02 DIAGNOSIS — Z7189 Other specified counseling: Secondary | ICD-10-CM | POA: Diagnosis not present

## 2019-04-02 NOTE — Patient Instructions (Signed)

## 2019-04-02 NOTE — Progress Notes (Signed)
Virtual Visit via Telephone Note   This visit type was conducted due to national recommendations for restrictions regarding the COVID-19 Pandemic (e.g. social distancing) in an effort to limit this patient's exposure and mitigate transmission in our community.  Due to his co-morbid illnesses, this patient is at least at moderate risk for complications without adequate follow up.  This format is felt to be most appropriate for this patient at this time.  The patient did not have access to video technology/had technical difficulties with video requiring transitioning to audio format only (telephone).  All issues noted in this document were discussed and addressed.  No physical exam could be performed with this format.  Please refer to the patient's chart for his  consent to telehealth for [facility].   The patient was identified using 2 identifiers.  Date:  05/24/2019   ID:  Patrick Villa, DOB [birth year], MRN [id number]  Patient Location: Home Provider Location: Home  PCP:  Alycia Rossetti, MD  Cardiologist:  Buford Dresser, MD  Electrophysiologist:  None   Evaluation Performed:  Follow-Up Visit  Chief Complaint:  Follow up  History of Present Illness:    Patrick Villa is a 86 y.o. male with a hx of COPD, hyperlipidemia, hypertension, second degree AV block type I who is seen in follow up today. I initially saw him as a new consult 01/2018 at the request of [place], Modena Nunnery, MD for the evaluation and management of second degree, type I heart block.   The patient does not have symptoms concerning for COVID-19 infection (fever, chills, cough, or new shortness of breath).   Reports he is doing well. Blood pressure has been fluctuating. Tolerating medications. 3/1 160/72, 85  3/2 121/60, 97 3/4 164/75, 80  142/69, 94 146/73, 78 3/5 122/59, 97 3/6 118/58, 77  110/57, 89 3/7 125/64, 78 3/8 160/78, 79  145/69, 80  ROS positive for watery eyes, which is new for him.    Hasn't received sildenafil yet.  Denies shortness of breath at rest or with normal exertion. No PND, orthopnea, LE edema or unexpected weight gain. No syncope or palpitations. Rare chest tenderness, unchanged from prior.  Past Medical History:  Diagnosis Date  . Colon polyps   . COPD (chronic obstructive pulmonary disease) ([place])   . Echocardiogram abnormal   . Ganglion cyst   . Hematuria   . Hyperlipidemia   . Hypertension   . Systolic murmur    Past Surgical History:  Procedure Laterality Date  . COLON SURGERY    . TRANSTHORACIC ECHOCARDIOGRAM       Current Meds  Medication Sig  . aspirin EC 81 MG tablet Take 1 tablet (81 mg total) by mouth daily.  . benazepril (LOTENSIN) 40 MG tablet Take 1 tablet (40 mg total) by mouth daily.  . chlorthalidone (HYGROTON) 25 MG tablet Take 1 tablet (25 mg total) by mouth daily.  . Cholecalciferol (VITAMIN D) 2000 UNITS CAPS Take 1 capsule (2,000 Units total) by mouth daily.  . hydrALAZINE (APRESOLINE) 100 MG tablet Take 100 mg by mouth 2 (two) times daily.  . sildenafil (REVATIO) 20 MG tablet Take 3 tablets 1 hour before intercourse  . [DISCONTINUED] finasteride (PROSCAR) 5 MG tablet Take 1 tablet by mouth daily  . [DISCONTINUED] terazosin (HYTRIN) 10 MG capsule Take 1 capsule by mouth daily  HS     Allergies:   Amlodipine, Lipitor [atorvastatin calcium], Zocor [simvastatin], Bactrim [sulfamethoxazole-trimethoprim], and Pravachol   Social History  Tobacco Use  . Smoking status: Former Smoker    Quit date: 10/18/1981    Years since quitting: 37.6  . Smokeless tobacco: Never Used  Substance Use Topics  . Alcohol use: No  . Drug use: No     Family Hx: The patient's family history includes Diabetes in his mother; Stroke in his brother.  ROS:   Please see the history of present illness.    All other systems reviewed and are negative.   Prior CV studies:   The following studies were reviewed today: Myoview 02/06/19  Nuclear  stress EF: 48%.  The left ventricular ejection fraction is mildly decreased (45-54%).  No T wave inversion was noted during stress.  There was no ST segment deviation noted during stress.  This is an intermediate risk study.   No ischemia. LVEF 48% with dilated LV and global hypokinesis. This is an intermediate risk study.  Labs/Other Tests and Data Reviewed:    EKG:  An ECG dated 02/02/19 was personally reviewed today and demonstrated:  SR, second degree AV block, type I, RBBB  Recent Labs: 08/25/2018: ALT 18 02/02/2019: Hemoglobin 12.2; Platelets 214 03/02/2019: BUN 19; Creat 1.21; Potassium 3.9; Sodium 141   Recent Lipid Panel Lab Results  Component Value Date/Time   CHOL 178 03/02/2019 09:38 AM   TRIG 53 03/02/2019 09:38 AM   HDL 43 03/02/2019 09:38 AM   CHOLHDL 4.1 03/02/2019 09:38 AM   LDLCALC 121 (H) 03/02/2019 09:38 AM    Wt Readings from Last 3 Encounters:  04/02/19 192 lb (87.1 kg)  03/27/19 192 lb (87.1 kg)  03/02/19 192 lb 4 oz (87.2 kg)     Objective:    Vital Signs:  BP (!) 145/69   Pulse 80   Ht 5\' 6"  (1.676 m)   Wt 192 lb (87.1 kg)   BMI 30.99 kg/m    Speaking comfortably on the phone, no audible wheezing In no acute distress Alert and oriented Normal affect Normal speech  ASSESSMENT & PLAN:    Hypertension: highly variable, but closer to goal than we have been previously -continue Benazepril 40 mg daily -continue chlorthalidone 25 mg daily -continue Hydralazine 100 mg at least twice a day, could not take three times/day routinely -continue to monitor, call if consistently elevated  -tried amlodipine, had jitteriness/tremors. Had chest wall pain on spironolactone.  -no diltiazem/verapamil, beta blocker due to second degree AV block type 1 ([place]) -is on terazosin as well  Aspirin use: will discuss again next visit given age, no ischemia on stress test  Hypercholesterolemia: LDL 121. Discussed today. He would like to avoid adding  additional medication. No known CAD. Defer statin based on discussion, age.  Murmur: aortic sclerosis without stenosis, no further workup needed. Echo 01/2018  History of chest discomfort: longstanding, atypical. Lexiscan without ischemia.   CV risk and prevention: -recommend heart healthy/Mediterranean diet, with whole grains, fruits, vegetable, fish, lean meats, nuts, and olive oil. Limit salt. -recommend moderate walking, 3-5 times/week for 30-50 minutes each session. Aim for at least 150 minutes.week. Goal should be pace of 3 miles/hours, or walking 1.5 miles in 30 minutes -recommend avoidance of tobacco products. Avoid excess alcohol.  COVID-19 Education: The signs and symptoms of COVID-19 were discussed with the patient and how to seek care for testing (follow up with PCP or arrange E-visit).  The importance of social distancing was discussed today.  Time:   Today, I have spent 10 minutes with the patient with telehealth technology discussing the  above problems.     Patient Instructions  Medication Instructions:  Your Physician recommend you continue on your current medication as directed.    *If you need a refill on your cardiac medications before your next appointment, please call your pharmacy*   Lab Work: None.   Testing/Procedures: None   Follow-Up: At [facility], you and your health needs are our priority.  As part of our continuing mission to provide you with exceptional heart care, we have created designated Provider Care Teams.  These Care Teams include your primary Cardiologist (physician) and Advanced Practice Providers (APPs -  Physician Assistants and Nurse Practitioners) who all work together to provide you with the care you need, when you need it.  We recommend signing up for the patient portal called "MyChart".  Sign up information is provided on this After Visit Summary.  MyChart is used to connect with patients for Virtual Visits (Telemedicine).   Patients are able to view lab/test results, encounter notes, upcoming appointments, etc.  Non-urgent messages can be sent to your provider as well.   To learn more about what you can do with MyChart, go to [email or web address].    Your next appointment:   6 month(s)  The format for your next appointment:   In Person  Provider:   Buford Dresser, MD     Signed, Buford Dresser, MD  05/24/2019 1:00 PM    [place]

## 2019-04-24 ENCOUNTER — Other Ambulatory Visit: Payer: Self-pay | Admitting: Family Medicine

## 2019-05-24 ENCOUNTER — Encounter: Payer: Self-pay | Admitting: Cardiology

## 2019-07-16 ENCOUNTER — Telehealth: Payer: Self-pay | Admitting: Family Medicine

## 2019-07-16 NOTE — Progress Notes (Signed)
  Chronic Care Management   Outreach Note  07/16/2019 Name: Patrick Villa MRN: [id number] DOB: May 03, 1933  Referred by: Alycia Rossetti, MD Reason for referral : No chief complaint on file.   An unsuccessful telephone outreach was attempted today. The patient was referred to the pharmacist for assistance with care management and care coordination.   Follow Up Plan:   [place]

## 2019-07-23 ENCOUNTER — Encounter: Payer: Self-pay | Admitting: Family Medicine

## 2019-07-23 ENCOUNTER — Other Ambulatory Visit: Payer: Self-pay

## 2019-07-23 ENCOUNTER — Ambulatory Visit (INDEPENDENT_AMBULATORY_CARE_PROVIDER_SITE_OTHER): Payer: Medicare Other | Admitting: Family Medicine

## 2019-07-23 VITALS — BP 172/60 | HR 94 | Temp 97.9°F | Resp 14 | Ht 66.0 in | Wt 193.0 lb

## 2019-07-23 DIAGNOSIS — Z125 Encounter for screening for malignant neoplasm of prostate: Secondary | ICD-10-CM

## 2019-07-23 DIAGNOSIS — I10 Essential (primary) hypertension: Secondary | ICD-10-CM

## 2019-07-23 DIAGNOSIS — N644 Mastodynia: Secondary | ICD-10-CM | POA: Diagnosis not present

## 2019-07-23 DIAGNOSIS — A048 Other specified bacterial intestinal infections: Secondary | ICD-10-CM

## 2019-07-23 DIAGNOSIS — K219 Gastro-esophageal reflux disease without esophagitis: Secondary | ICD-10-CM | POA: Diagnosis not present

## 2019-07-23 DIAGNOSIS — R634 Abnormal weight loss: Secondary | ICD-10-CM

## 2019-07-23 LAB — CBC WITH DIFFERENTIAL/PLATELET
Absolute Monocytes: 496 cells/uL (ref 200–950)
Basophils Absolute: 50 cells/uL (ref 0–200)
Basophils Relative: 0.8 %
Eosinophils Absolute: 267 cells/uL (ref 15–500)
Eosinophils Relative: 4.3 %
HCT: 38.5 % (ref 38.5–50.0)
Hemoglobin: 12.5 g/dL — ABNORMAL LOW (ref 13.2–17.1)
Lymphs Abs: 1631 cells/uL (ref 850–3900)
MCH: 28.3 pg (ref 27.0–33.0)
MCHC: 32.5 g/dL (ref 32.0–36.0)
MCV: 87.1 fL (ref 80.0–100.0)
MPV: 11.6 fL (ref 7.5–12.5)
Monocytes Relative: 8 %
Neutro Abs: 3757 cells/uL (ref 1500–7800)
Neutrophils Relative %: 60.6 %
Platelets: 226 10*3/uL (ref 140–400)
RBC: 4.42 10*6/uL (ref 4.20–5.80)
RDW: 12.9 % (ref 11.0–15.0)
Total Lymphocyte: 26.3 %
WBC: 6.2 10*3/uL (ref 3.8–10.8)

## 2019-07-23 LAB — COMPREHENSIVE METABOLIC PANEL
AG Ratio: 1.6 (calc) (ref 1.0–2.5)
ALT: 19 U/L (ref 9–46)
AST: 17 U/L (ref 10–35)
Albumin: 4.1 g/dL (ref 3.6–5.1)
Alkaline phosphatase (APISO): 53 U/L (ref 35–144)
BUN/Creatinine Ratio: 13 (calc) (ref 6–22)
BUN: 15 mg/dL (ref 7–25)
CO2: 28 mmol/L (ref 20–32)
Calcium: 9.9 mg/dL (ref 8.6–10.3)
Chloride: 105 mmol/L (ref 98–110)
Creat: 1.2 mg/dL — ABNORMAL HIGH (ref 0.70–1.11)
Globulin: 2.5 g/dL (calc) (ref 1.9–3.7)
Glucose, Bld: 147 mg/dL — ABNORMAL HIGH (ref 65–99)
Potassium: 3.9 mmol/L (ref 3.5–5.3)
Sodium: 140 mmol/L (ref 135–146)
Total Bilirubin: 0.3 mg/dL (ref 0.2–1.2)
Total Protein: 6.6 g/dL (ref 6.1–8.1)

## 2019-07-23 LAB — LIPASE: Lipase: 17 U/L (ref 7–60)

## 2019-07-23 NOTE — Patient Instructions (Addendum)
CT scan to be done We will call with lab results  Mammogram to be done  F/U pending results

## 2019-07-23 NOTE — Progress Notes (Signed)
Subjective:    Patient ID: Patrick Villa, male    DOB: 1933-11-17, 86 y.o.   MRN: [id number]  Patient presents for Weight Loss (x6 weeks- reports that he has noted that his pants are getting too big- has gone from size 40 to 36- no changes in appetitie or BM)   Pt here with weight loss over the past 1-2 months.   Has a burning sensation in chest and has some left sided abdominal discomfort. This has been worked up by Cardiology.  He complains that is over his left breast constantly.  He sometimes feels a fullness and feels like it is heavier and is worse.  He has never had any imaging done on the breast.  He also states that he gets a burning sensation sometimes it is in the middle of his stomach other times it is across that left side and into that breast area.  He has tried Pepto-Bismol on occasion which has helped some.  He is significantly worried about cancer.  He states that he has lost weight his pant size and went down for sizes however there has been no change on the scale.  He denies any pain with eating states that his appetite is good.  He has not had any change in his bowel movements.  Was evaluated by GI doctor for telehealth visit he was supposed to have colonoscopy but due to his age and comorbidities they decided to hold off on doing this.    HTN- this is uncontrolled , taking benezapril 40mg , chlorathalidone 1/2 tablet , hydralazine twice a day      Chest xray negative in Jan 2021     Review Of Systems:  GEN- denies fatigue, fever, weight loss,weakness, recent illness HEENT- denies eye drainage, change in vision, nasal discharge, CVS-+chest pain, palpitations RESP- denies SOB, cough, wheeze ABD- denies N/V, change in stools,+ abd pain GU- denies dysuria, hematuria, dribbling, incontinence MSK- denies joint pain, muscle aches, injury Neuro- denies headache, dizziness, syncope, seizure activity       Objective:    BP (!) 172/60 (BP Location: Right Arm, Patient  Position: Sitting, Cuff Size: Normal)   Pulse 94   Temp 97.9 F (36.6 C) (Temporal)   Resp 14   Ht 5\' 6"  (1.676 m)   Wt 193 lb (87.5 kg)   SpO2 97%   BMI 31.15 kg/m  GEN- NAD, alert and oriented x3 HEENT- PERRL, EOMI, non injected sclera, pink conjunctiva, MMM, oropharynx clear Neck- Supple, no thyromegaly Breast- normal symmetry, no nipple inversion,no nipple drainage, no nodules or lumps felt, tender to palpation over left breast Nodes- no axillary nodes CVS- RRR, no murmur RESP-CTAB ABD-NABS,soft,NT,ND EXT- No edema Pulses- Radial  2+        Assessment & Plan:      Problem List Items Addressed This Visit      Unprioritized   Uncontrolled hypertension   Relevant Orders   CBC with Differential/Platelet (Completed)   Comprehensive metabolic panel (Completed)    Other Visit Diagnoses    Weight loss, unintentional    -  Primary   No documented weight loss of concern, query if bloated and this has gone down or shift in fluid with diuretic. Treat H Pylori first, consider CT scan, but will hold on this for now   Relevant Orders   CBC with Differential/Platelet (Completed)   Comprehensive metabolic panel (Completed)   Gastroesophageal reflux disease without esophagitis       Relevant Orders  CBC with Differential/Platelet (Completed)   Comprehensive metabolic panel (Completed)   H. pylori breath test (Completed)   Lipase (Completed)   Prostate cancer screening       Breast pain, left       obtain diagnostic mammogram left breast, though rare breast cancer, pain centered in breast region, not cardiac, CXR neg for same symptoms in Jan   H. pylori infection       Treat with triple therapy       Note: This dictation was prepared with Dragon dictation along with smaller phrase technology. Any transcriptional errors that result from this process are unintentional.

## 2019-07-24 ENCOUNTER — Encounter: Payer: Self-pay | Admitting: Family Medicine

## 2019-07-24 LAB — H. PYLORI BREATH TEST: H. pylori Breath Test: DETECTED — AB

## 2019-07-24 MED ORDER — OMEPRAZOLE 20 MG PO CPDR: 20.0000 mg | DELAYED_RELEASE_CAPSULE | Freq: Two times a day (BID) | ORAL | 1 refills | Status: DC

## 2019-07-24 MED ORDER — CLARITHROMYCIN 500 MG PO TABS: 500.0000 mg | ORAL_TABLET | Freq: Two times a day (BID) | ORAL | 0 refills | Status: DC

## 2019-07-24 MED ORDER — AMOXICILLIN 500 MG PO CAPS: 500.0000 mg | ORAL_CAPSULE | Freq: Two times a day (BID) | ORAL | 0 refills | Status: DC

## 2019-07-27 ENCOUNTER — Other Ambulatory Visit: Payer: Self-pay | Admitting: Family Medicine

## 2019-07-27 ENCOUNTER — Ambulatory Visit: Payer: Medicare Other | Admitting: Family Medicine

## 2019-07-27 DIAGNOSIS — N644 Mastodynia: Secondary | ICD-10-CM

## 2019-08-06 ENCOUNTER — Telehealth: Payer: Self-pay | Admitting: Family Medicine

## 2019-08-06 NOTE — Progress Notes (Signed)
  Chronic Care Management   Note  08/06/2019 Name: MAJESTY OEHLERT MRN: [id number] DOB: November 03, 1933  [name] is a 86 y.o. year old male who is a primary care patient of [place], Modena Nunnery, MD. I reached out to [name] by phone today in response to a referral sent by Mr. Dawn Kiper Idler's PCP, Alycia Rossetti, MD.   Mr. Sweeney was given information about Chronic Care Management services today including:  1. CCM service includes personalized support from designated clinical staff supervised by his physician, including individualized plan of care and coordination with other care providers 2. 24/7 contact phone numbers for assistance for urgent and routine care needs. 3. Service will only be billed when office clinical staff spend 20 minutes or more in a month to coordinate care. 4. Only one practitioner may furnish and bill the service in a calendar month. 5. The patient may stop CCM services at any time (effective at the end of the month) by phone call to the office staff.   Patient agreed to services and verbal consent obtained.   Follow up plan:   [place]

## 2019-08-09 ENCOUNTER — Telehealth: Payer: Self-pay | Admitting: *Deleted

## 2019-08-09 NOTE — Telephone Encounter (Signed)
A detailed message was left,re: his follow up visit. °

## 2019-08-14 ENCOUNTER — Ambulatory Visit
Admission: RE | Admit: 2019-08-14 | Discharge: 2019-08-14 | Disposition: A | Discharge status: Home / Self Care | Payer: Medicare Other | Source: Ambulatory Visit | Attending: Family Medicine | Admitting: Family Medicine

## 2019-08-14 ENCOUNTER — Ambulatory Visit: Payer: Medicare Other

## 2019-08-14 ENCOUNTER — Other Ambulatory Visit: Payer: Self-pay

## 2019-08-14 DIAGNOSIS — N644 Mastodynia: Secondary | ICD-10-CM

## 2019-08-14 DIAGNOSIS — R928 Other abnormal and inconclusive findings on diagnostic imaging of breast: Secondary | ICD-10-CM | POA: Diagnosis not present

## 2019-08-21 ENCOUNTER — Other Ambulatory Visit: Payer: Self-pay | Admitting: *Deleted

## 2019-08-21 DIAGNOSIS — I10 Essential (primary) hypertension: Secondary | ICD-10-CM

## 2019-08-21 DIAGNOSIS — E7800 Pure hypercholesterolemia, unspecified: Secondary | ICD-10-CM

## 2019-08-21 DIAGNOSIS — K219 Gastro-esophageal reflux disease without esophagitis: Secondary | ICD-10-CM

## 2019-08-21 DIAGNOSIS — R202 Paresthesia of skin: Secondary | ICD-10-CM

## 2019-08-21 DIAGNOSIS — E559 Vitamin D deficiency, unspecified: Secondary | ICD-10-CM

## 2019-08-21 DIAGNOSIS — N400 Enlarged prostate without lower urinary tract symptoms: Secondary | ICD-10-CM

## 2019-08-21 DIAGNOSIS — R634 Abnormal weight loss: Secondary | ICD-10-CM

## 2019-08-21 DIAGNOSIS — I441 Atrioventricular block, second degree: Secondary | ICD-10-CM

## 2019-08-21 NOTE — Chronic Care Management (AMB) (Signed)
Chronic Care Management Pharmacy  Name: Patrick Villa  MRN: [id number] DOB: 1933/06/26  Chief Complaint/ HPI  Patrick Villa,  86 y.o. , male presents for their Initial CCM visit with the clinical pharmacist In office.  PCP : Alycia Rossetti, MD  Their chronic conditions include: hypertension, COPD, hyperlipidemia, vitamin D Deficiency.  Office Visits:  07/23/2019 [facility]) - unintentional weight loss  Patient concerned about cancer  Treated for H. Pylori  Mammogram and CT scan ordered - mammogram negative  03/27/2019 [facility]) - BP follow uup  Continued all medications  Given sildenafil 58m to take for ED  03/02/2019 ([facility] - BP follow up  Not very compliant with BP meds  Given clonidine 0.129mto take if systolic BP > 20330Back to hydralazine 50 mg tid  Consult Visit:none recent  Medications: Outpatient Encounter Medications as of 08/23/2019  Medication Sig  . aspirin EC 81 MG tablet Take 1 tablet (81 mg total) by mouth daily.  . benazepril (LOTENSIN) 40 MG tablet Take 1 tablet (40 mg total) by mouth daily.  . chlorthalidone (HYGROTON) 25 MG tablet Take 1 tablet (25 mg total) by mouth daily.  . Cholecalciferol (VITAMIN D) 2000 UNITS CAPS Take 1 capsule (2,000 Units total) by mouth daily.  . finasteride (PROSCAR) 5 MG tablet TAKE 1 TABLET BY MOUTH  DAILY  . hydrALAZINE (APRESOLINE) 100 MG tablet Take 100 mg by mouth 2 (two) times daily.  . Marland Kitchenerazosin (HYTRIN) 10 MG capsule TAKE 1 CAPSULE BY MOUTH  DAILY AT BEDTIME  . amoxicillin (AMOXIL) 500 MG capsule Take 1 capsule (500 mg total) by mouth 2 (two) times daily. (Patient not taking: Reported on 08/23/2019)  . clarithromycin (BIAXIN) 500 MG tablet Take 1 tablet (500 mg total) by mouth 2 (two) times daily. (Patient not taking: Reported on 08/23/2019)  . omeprazole (PRILOSEC) 20 MG capsule Take 1 capsule (20 mg total) by mouth 2 (two) times daily before a meal. (Patient not taking: Reported on 08/23/2019)  . sildenafil  (REVATIO) 20 MG tablet Take 3 tablets 1 hour before intercourse (Patient not taking: Reported on 08/23/2019)   No facility-administered encounter medications on file as of 08/23/2019.     Current Diagnosis/Assessment:   FiEmergency planning/management officertrain: Low Risk   . Difficulty of Paying Living Expenses: Not very hard    Goals Addressed            This Visit's Progress   . Pharmacy Care Plan:       CARE PLAN ENTRY (see longitudinal plan of care for additional care plan information)  Current Barriers:  . Chronic Disease Management support, education, and care coordination needs related to Hypertension and Hyperlipidemia   Hypertension BP Readings from Last 3 Encounters:  07/23/19 (!) 172/60  04/02/19 (!) 145/69  03/27/19 (!) 150/64   . Pharmacist Clinical Goal(s): o Over the next  180 days, patient will work with PharmD and providers to achieve BP goal <140/90 . Current regimen:  o Benazepril 4039maily o Hydralazine 65m52mice daily . Interventions: o Reviewed home blood pressure monitoring o Discussed importance of medication adherence . Patient self care activities - Over the next 180 days, patient will: o Check BP daily, document, and provide at future appointments o Ensure daily salt intake < 2300 mg/day o Contact providers with consistent readings > 160 076tolic  Hyperlipidemia Lab Results  Component Value Date/Time   LDLCALC 121 (H) 03/02/2019 09:38 AM   . Pharmacist Clinical Goal(s): o Over the next  180 days, patient will work with PharmD and providers to maintain LDL goal < 100 . Current regimen:  o No medications . Interventions: o Counseled on importance of diet low in saturated fats, especially with increased blood pressure o Discussed history of statin intolerance . Patient self care activities - Over the next 180 days, patient will: o Continue to work on diet high in fresh vegetables and low in saturated fats o Remain active in garden and  workshop  Initial goal documentation        Hyperlipidemia   LDL goal < 100  Lipid Panel     Component Value Date/Time   CHOL 178 03/02/2019 0938   TRIG 53 03/02/2019 0938   HDL 43 03/02/2019 0938   LDLCALC 121 (H) 03/02/2019 0938    Hepatic Function Latest Ref Rng & Units 07/23/2019 08/25/2018 01/27/2018  Total Protein 6.1 - 8.1 g/dL 6.6 6.8 6.9  Albumin 3.5 - 5.0 g/dL - - 3.6  AST 10 - 35 U/L _0 ALT 9 - 46 U/L _1 Alk Phosphatase 38 - 126 U/L - - 36(L)  Total Bilirubin 0.2 - 1.2 mg/dL 0.3 0.3 0.5  Bilirubin, Direct 0.0 - 0.2 mg/dL - - <0.1     The ASCVD Risk score Mikey Bussing DC Jr., et al., 2013) failed to calculate for the following reasons:   The 2013 ASCVD risk score is only valid for ages 47 to 20   Patient has failed these meds in past: atorvastatin, simvastatin, pravastatin  Patient is currently uncontrolled on the following medications:  . No medications  Patient eats fresh vegetables from garden, very active gardening and in shop at his home daily. LDL slightly elevated.  Discussed diet low in saturated fats to bring down LDL.  Patient has tried and failed multiple statins.  Will follow given current age.  Plan  Continue to control with diet and exercise.    Hypertension    Office blood pressures are  BP Readings from Last 3 Encounters:  07/23/19 (!) 172/60  04/02/19 (!) 145/69  03/27/19 (!) 150/64    Patient has failed these meds in the past: chlorthalidone, amlodipine  Patient checks BP at home daily  Patient home BP readings are ranging: 150s-170s/80-90  Patient is currently uncontrolled on the following medications:   Chlorthalidone 52m daily - not taking currently  Benazepril 437mdaily  Hydralazine 10020mid - currently taking 70m70md  Patient reports chlorthalidone made his whole body hurt and all he wanted to do was sit on the couch so he is no longer taking this.  Hydralazine he is currently taking 70mg64mce daily.  Any more  than that causes him to have chest pain/burning sensation in his chest.  Mammogram was negative.  Tolerated 70mg 73me daily dose.  Monitors daily, reports highest lately was one random BP that was 170s s741Ulic.  Counseled on importance of taking his meds daily. Would really like to see lower BP however with non-compliance and allergies this may be hard to achieve.   Plan  Continue current medications, continue to stay active in garden, contact providers with consistent readings in the 170s.      Vitamin D Deficiency   Last Vit D one year ago - 34 (WN38  Patient has failed these meds in past: none noted Patient is currently controlled on the following medications:   Vitamin D 2000 IU daily  Controlled on OTC supplementation.  Due for repeat Vit D.  Plan  Continue current medications, recommend repeat Vitamin D.  Vaccines   Reviewed and discussed patient's vaccination history.    Immunization History  Administered Date(s) Administered  . Fluad Quad(high Dose 65+) 10/19/2018  . Influenza Whole 11/10/2009  . Influenza, High Dose Seasonal PF 11/01/2016, 11/02/2017  . Influenza,inj,Quad PF,6+ Mos 10/18/2012, 01/02/2014  . Influenza-Unspecified 10/15/2014, 11/10/2015  . PFIZER SARS-COV-2 Vaccination 02/14/2019, 03/07/2019  . Pneumococcal Conjugate-13 05/31/2013  . Pneumococcal Polysaccharide-23 11/30/2010  . Td 05/26/2010  . Tdap 05/26/2010    Plan  Recommended patient receive shingles vaccine in office.  Medication Management   . Miscellaneous medications:  o Terazosin 72m hs o Finasteride 530mdaily . OTC's:  o ASA 8181maily . [facility].  Phone #  [phone] reports using vials method to organize medications and promote adherence. . Patient denies missed doses of medication.   ChrBeverly MilchharmD Clinical Pharmacist [phone]

## 2019-08-23 ENCOUNTER — Ambulatory Visit: Payer: Medicare Other | Admitting: Pharmacist

## 2019-08-23 ENCOUNTER — Other Ambulatory Visit: Payer: Self-pay

## 2019-08-23 DIAGNOSIS — E559 Vitamin D deficiency, unspecified: Secondary | ICD-10-CM

## 2019-08-23 DIAGNOSIS — I10 Essential (primary) hypertension: Secondary | ICD-10-CM

## 2019-08-23 DIAGNOSIS — E7800 Pure hypercholesterolemia, unspecified: Secondary | ICD-10-CM

## 2019-08-23 NOTE — Patient Instructions (Addendum)
Visit Information Thank you for meeting with me today!  I look forward to working with you to help you meet all of your healthcare goals and answer any questions you may have.  Feel free to contact me anytime!  Goals Addressed            This Visit's Progress   . Pharmacy Care Plan:         Patrick Villa was given information about Chronic Care Management services today including:  1. CCM service includes personalized support from designated clinical staff supervised by his physician, including individualized plan of care and coordination with other care providers 2. 24/7 contact phone numbers for assistance for urgent and routine care needs. 3. Standard insurance, coinsurance, copays and deductibles apply for chronic care management only during months in which we provide at least 20 minutes of these services. Most insurances cover these services at 100%, however patients may be responsible for any copay, coinsurance and/or deductible if applicable. This service may help you avoid the need for more expensive face-to-face services. 4. Only one practitioner may furnish and bill the service in a calendar month. 5. The patient may stop CCM services at any time (effective at the end of the month) by phone call to the office staff.  Patient agreed to services and verbal consent obtained.   The patient verbalized understanding of instructions provided today and agreed to receive a mailed copy of patient instruction and/or educational materials. Telephone follow up appointment with pharmacy team member scheduled for: 6 months  Beverly Milch, PharmD Clinical Pharmacist [facility] [phone]   Managing Your Hypertension Hypertension is commonly called high blood pressure. This is when the force of your blood pressing against the walls of your arteries is too strong. Arteries are blood vessels that carry blood from your heart throughout your body. Hypertension forces the  heart to work harder to pump blood, and may cause the arteries to become narrow or stiff. Having untreated or uncontrolled hypertension can cause heart attack, stroke, kidney disease, and other problems. What are blood pressure readings? A blood pressure reading consists of a higher number over a lower number. Ideally, your blood pressure should be below 120/80. The first ("top") number is called the systolic pressure. It is a measure of the pressure in your arteries as your heart beats. The second ("bottom") number is called the diastolic pressure. It is a measure of the pressure in your arteries as the heart relaxes. What does my blood pressure reading mean? Blood pressure is classified into four stages. Based on your blood pressure reading, your health care provider may use the following stages to determine what type of treatment you need, if any. Systolic pressure and diastolic pressure are measured in a unit called mm Hg. Normal  Systolic pressure: below 672.  Diastolic pressure: below 80. Elevated  Systolic pressure: 094-709.  Diastolic pressure: below 80. Hypertension stage 1  Systolic pressure: 628-366.  Diastolic pressure: 29-47. Hypertension stage 2  Systolic pressure: 654 or above.  Diastolic pressure: 90 or above. What health risks are associated with hypertension? Managing your hypertension is an important responsibility. Uncontrolled hypertension can lead to:  A heart attack.  A stroke.  A weakened blood vessel (aneurysm).  Heart failure.  Kidney damage.  Eye damage.  Metabolic syndrome.  Memory and concentration problems. What changes can I make to manage my hypertension? Hypertension can be managed by making lifestyle changes and possibly by taking medicines. Your health care provider  will help you make a plan to bring your blood pressure within a normal range. Eating and drinking   Eat a diet that is high in fiber and potassium, and low in salt  (sodium), added sugar, and fat. An example eating plan is called the DASH (Dietary Approaches to Stop Hypertension) diet. To eat this way: ? Eat plenty of fresh fruits and vegetables. Try to fill half of your plate at each meal with fruits and vegetables. ? Eat whole grains, such as whole wheat pasta, brown rice, or whole grain bread. Fill about one quarter of your plate with whole grains. ? Eat low-fat diary products. ? Avoid fatty cuts of meat, processed or cured meats, and poultry with skin. Fill about one quarter of your plate with lean proteins such as fish, chicken without skin, beans, eggs, and tofu. ? Avoid premade and processed foods. These tend to be higher in sodium, added sugar, and fat.  Reduce your daily sodium intake. Most people with hypertension should eat less than 1,500 mg of sodium a day.  Limit alcohol intake to no more than 1 drink a day for nonpregnant women and 2 drinks a day for men. One drink equals 12 oz of beer, 5 oz of wine, or 1 oz of hard liquor. Lifestyle  Work with your health care provider to maintain a healthy body weight, or to lose weight. Ask what an ideal weight is for you.  Get at least 30 minutes of exercise that causes your heart to beat faster (aerobic exercise) most days of the week. Activities may include walking, swimming, or biking.  Include exercise to strengthen your muscles (resistance exercise), such as weight lifting, as part of your weekly exercise routine. Try to do these types of exercises for 30 minutes at least 3 days a week.  Do not use any products that contain nicotine or tobacco, such as cigarettes and e-cigarettes. If you need help quitting, ask your health care provider.  Control any long-term (chronic) conditions you have, such as high cholesterol or diabetes. Monitoring  Monitor your blood pressure at home as told by your health care provider. Your personal target blood pressure may vary depending on your medical conditions,  your age, and other factors.  Have your blood pressure checked regularly, as often as told by your health care provider. Working with your health care provider  Review all the medicines you take with your health care provider because there may be side effects or interactions.  Talk with your health care provider about your diet, exercise habits, and other lifestyle factors that may be contributing to hypertension.  Visit your health care provider regularly. Your health care provider can help you create and adjust your plan for managing hypertension. Will I need medicine to control my blood pressure? Your health care provider may prescribe medicine if lifestyle changes are not enough to get your blood pressure under control, and if:  Your systolic blood pressure is 130 or higher.  Your diastolic blood pressure is 80 or higher. Take medicines only as told by your health care provider. Follow the directions carefully. Blood pressure medicines must be taken as prescribed. The medicine does not work as well when you skip doses. Skipping doses also puts you at risk for problems. Contact a health care provider if:  You think you are having a reaction to medicines you have taken.  You have repeated (recurrent) headaches.  You feel dizzy.  You have swelling in your ankles.  You have  trouble with your vision. Get help right away if:  You develop a severe headache or confusion.  You have unusual weakness or numbness, or you feel faint.  You have severe pain in your chest or abdomen.  You vomit repeatedly.  You have trouble breathing. Summary  Hypertension is when the force of blood pumping through your arteries is too strong. If this condition is not controlled, it may put you at risk for serious complications.  Your personal target blood pressure may vary depending on your medical conditions, your age, and other factors. For most people, a normal blood pressure is less than  120/80.  Hypertension is managed by lifestyle changes, medicines, or both. Lifestyle changes include weight loss, eating a healthy, low-sodium diet, exercising more, and limiting alcohol. This information is not intended to replace advice given to you by your health care provider. Make sure you discuss any questions you have with your health care provider. Document Revised: 05/05/2018 Document Reviewed: 12/10/2015 Elsevier Patient Education  [place].

## 2019-09-07 ENCOUNTER — Ambulatory Visit: Payer: Medicare Other | Admitting: Cardiology

## 2019-09-07 ENCOUNTER — Other Ambulatory Visit: Payer: Self-pay

## 2019-09-07 ENCOUNTER — Encounter: Payer: Self-pay | Admitting: Cardiology

## 2019-09-07 VITALS — BP 158/96 | HR 67 | Temp 97.7°F | Ht 66.0 in | Wt 192.0 lb

## 2019-09-07 DIAGNOSIS — Z7189 Other specified counseling: Secondary | ICD-10-CM

## 2019-09-07 DIAGNOSIS — I10 Essential (primary) hypertension: Secondary | ICD-10-CM

## 2019-09-07 DIAGNOSIS — E7800 Pure hypercholesterolemia, unspecified: Secondary | ICD-10-CM | POA: Diagnosis not present

## 2019-09-07 DIAGNOSIS — R011 Cardiac murmur, unspecified: Secondary | ICD-10-CM

## 2019-09-07 MED ORDER — ISOSORBIDE MONONITRATE ER 30 MG PO TB24: 30.0000 mg | ORAL_TABLET | Freq: Every day | ORAL | 3 refills | Status: DC

## 2019-09-07 NOTE — Patient Instructions (Signed)
Medication Instructions:  Start Imdur 30 mg daily   *If you need a refill on your cardiac medications before your next appointment, please call your pharmacy*   Lab Work: None   Testing/Procedures: None   Follow-Up: At [facility], you and your health needs are our priority.  As part of our continuing mission to provide you with exceptional heart care, we have created designated Provider Care Teams.  These Care Teams include your primary Cardiologist (physician) and Advanced Practice Providers (APPs -  Physician Assistants and Nurse Practitioners) who all work together to provide you with the care you need, when you need it.  We recommend signing up for the patient portal called "MyChart".  Sign up information is provided on this After Visit Summary.  MyChart is used to connect with patients for Virtual Visits (Telemedicine).  Patients are able to view lab/test results, encounter notes, upcoming appointments, etc.  Non-urgent messages can be sent to your provider as well.   To learn more about what you can do with MyChart, go to [email or web address].    Your next appointment:   3 month(s)  The format for your next appointment:   In Person  Provider:   Buford Dresser, MD

## 2019-09-07 NOTE — Progress Notes (Signed)
Cardiology Office Note:    Date:  09/07/2019   ID:  Patrick Villa, DOB 1933/04/09, MRN [id number]  PCP:  Alycia Rossetti, MD  Cardiologist:  Buford Dresser, MD PhD  Referring MD: Alycia Rossetti, MD   CC: follow up of hypertension  History of Present Illness:    Patrick Villa is a 86 y.o. male with a hx of COPD, hyperlipidemia, hypertension, second degree AV block type I who is seen in follow up today. I initially saw him as a new consult 01/2018 at the request of [place], Modena Nunnery, MD for the evaluation and management of second degree, type I heart block.   He was seen in the hospital on 01/27/18 and 01/28/18 by Drs. Fredrik Rigger for bilateral breast tenderness. At that time, troponins were negative, echo was unremarkable. Cardizem was stopped due to second degree AV block, type I.   When he first saw me, we discussed an exercise treadmill stress test, but first goal was to get BP under better control. He initially met with our Pharm[facility] HTN clinic for this, but he did not return for follow up.   Today: Had mammogram for his chest pain, normal.   Stopped chlorthalidone. Felt off balance, caused leg cramps/achiness. Tried cutting in half, then quarters, without improvement. Has felt better since stopping completely. Also didn't do well on 100 mg BID hydralazine, made his chest wall pain worse, dropped back to 50 mg BID hydralazine.  Feels best when BP is 150s/90s. When his BP had been lower systolics (in the 272Z in the past), felt very tired/fatigued.  Feels that his sex life is good, which is a priority for him.   Denies shortness of breath at rest or with normal exertion. No PND, orthopnea, LE edema or unexpected weight gain. No syncope or palpitations.  Past Medical History:  Diagnosis Date  . Colon polyps   . COPD (chronic obstructive pulmonary disease) ([place])   . Echocardiogram abnormal   . Ganglion cyst   . Hematuria   . Hyperlipidemia   . Hypertension   .  Systolic murmur     Past Surgical History:  Procedure Laterality Date  . COLON SURGERY    . TRANSTHORACIC ECHOCARDIOGRAM      Current Medications: Current Outpatient Medications on File Prior to Visit  Medication Sig  . aspirin EC 81 MG tablet Take 1 tablet (81 mg total) by mouth daily.  . benazepril (LOTENSIN) 40 MG tablet Take 1 tablet (40 mg total) by mouth daily.  . chlorthalidone (HYGROTON) 25 MG tablet Take 1 tablet (25 mg total) by mouth daily.  . Cholecalciferol (VITAMIN D) 2000 UNITS CAPS Take 1 capsule (2,000 Units total) by mouth daily.  . finasteride (PROSCAR) 5 MG tablet TAKE 1 TABLET BY MOUTH  DAILY  . hydrALAZINE (APRESOLINE) 100 MG tablet Take 100 mg by mouth 2 (two) times daily.  Marland Kitchen terazosin (HYTRIN) 10 MG capsule TAKE 1 CAPSULE BY MOUTH  DAILY AT BEDTIME  . amoxicillin (AMOXIL) 500 MG capsule Take 1 capsule (500 mg total) by mouth 2 (two) times daily. (Patient not taking: Reported on 08/23/2019)  . clarithromycin (BIAXIN) 500 MG tablet Take 1 tablet (500 mg total) by mouth 2 (two) times daily. (Patient not taking: Reported on 08/23/2019)   No current facility-administered medications on file prior to visit.     Allergies:   Amlodipine, Lipitor [atorvastatin calcium], Zocor [simvastatin], Bactrim [sulfamethoxazole-trimethoprim], and Pravachol   Social History   Tobacco Use  .  Smoking status: Former Smoker    Quit date: 10/18/1981    Years since quitting: 37.9  . Smokeless tobacco: Never Used  Substance Use Topics  . Alcohol use: No  . Drug use: No    Family History: The patient's family history includes Diabetes in his mother; Stroke in his brother.  ROS:   Please see the history of present illness.  Additional pertinent ROS negative except as documented.    EKGs/Labs/Other Studies Reviewed:    The following studies were reviewed today: Echo 01/28/18 - Left ventricle: The cavity size was normal. Wall thickness was   normal. Systolic function was normal.  The estimated ejection   fraction was in the range of 55% to 60%. Wall motion was normal;   there were no regional wall motion abnormalities. The study is   not technically sufficient to allow evaluation of LV diastolic   function. - Atrial septum: There was an atrial septal aneurysm.  Impressions:  - Normal LV function; sclerotic aortic valve.  EKG:  EKG is personally reviewed.  The ekg ordered 02/02/19 demonstrates sinus rhythm, 2nd degree AV block type 1, LAFB, PRWP  Recent Labs: 07/23/2019: ALT 19; BUN 15; Creat 1.20; Hemoglobin 12.5; Platelets 226; Potassium 3.9; Sodium 140  Recent Lipid Panel    Component Value Date/Time   CHOL 178 03/02/2019 0938   TRIG 53 03/02/2019 0938   HDL 43 03/02/2019 0938   CHOLHDL 4.1 03/02/2019 0938   VLDL 18 01/28/2018 0538   LDLCALC 121 (H) 03/02/2019 0938    Physical Exam:    VS:  BP (!) 158/96   Pulse 67   Temp 97.7 F (36.5 C)   Ht 5' 6"  (1.676 m)   Wt 192 lb (87.1 kg)   SpO2 98%   BMI 30.99 kg/m     Wt Readings from Last 3 Encounters:  09/07/19 192 lb (87.1 kg)  07/23/19 193 lb (87.5 kg)  04/02/19 192 lb (87.1 kg)    GEN: Well nourished, well developed in no acute distress HEENT: Normal, moist mucous membranes NECK: No JVD CARDIAC: regular rhythm, normal S1 and S2, no rubs or gallops. 1/6 SE murmur. VASCULAR: Radial and DP pulses 2+ bilaterally. No carotid bruits RESPIRATORY:  Clear to auscultation without rales, wheezing or rhonchi  ABDOMEN: Soft, non-tender, non-distended MUSCULOSKELETAL:  Ambulates independently SKIN: Warm and dry, no edema NEUROLOGIC:  Alert and oriented x 3. No focal neuro deficits noted. PSYCHIATRIC:  Normal affect   ASSESSMENT:    1. Uncontrolled hypertension   2. Pure hypercholesterolemia   3. Systolic murmur   4. Cardiac risk counseling   5. Counseling on health promotion and disease prevention    PLAN:    Hypertension, has not been well controlled. We have discussed extensively -remains  above goal.  -he feels well with systolics in the 696E. I discussed that I would like it <140/90 and ideally <130/80 -we discussed multiple options today. He is hesitant given his reactions to prior medications. He is amenable to trialing low dose imdur. -is on terazosin as well  History of antihypertensives -tried amlodipine, had jitteriness/tremors. Had chest wall pain on spironolactone.  -no diltiazem/verapamil, beta blocker due to second degree AV block type 1 ([place]) -had aches/balance issues on chlorthalidone -had worsening chest wall pain on >50 mg hydralazine  Aspirin use: we discussed this. No bleeding issues. He feels that it treats him well, would like to continue.  Murmur: aortic sclerosis without stenosis, no further workup needed. Echo 01/2018  History  of chest discomfort: longstanding, atypical. Lexiscan normal. Mammogram normal.  CV risk counseling and prevention recommendations -recommend heart healthy/Mediterranean diet, with whole grains, fruits, vegetable, fish, lean meats, nuts, and olive oil. Limit salt. -recommend moderate walking, 3-5 times/week for 30-50 minutes each session. Aim for at least 150 minutes.week. Goal should be pace of 3 miles/hours, or walking 1.5 miles in 30 minutes -recommend avoidance of tobacco products. Avoid excess alcohol. -ASCVD risk score: The ASCVD Risk score Mikey Bussing DC Jr., et al., 2013) failed to calculate for the following reasons:   The 2013 ASCVD risk score is only valid for ages 29 to 67   Plan for follow up: 3 mos  Buford Dresser, MD, PhD [place]  [facility]   Medication Adjustments/Labs and Tests Ordered: Current medicines are reviewed at length with the patient today.  Concerns regarding medicines are outlined above.  No orders of the defined types were placed in this encounter.  Meds ordered this encounter  Medications  . isosorbide mononitrate (IMDUR) 30 MG 24 hr tablet    Sig: Take 1 tablet (30 mg  total) by mouth daily.    Dispense:  90 tablet    Refill:  3    Patient Instructions  Medication Instructions:  Start Imdur 30 mg daily   *If you need a refill on your cardiac medications before your next appointment, please call your pharmacy*   Lab Work: None   Testing/Procedures: None   Follow-Up: At [facility], you and your health needs are our priority.  As part of our continuing mission to provide you with exceptional heart care, we have created designated Provider Care Teams.  These Care Teams include your primary Cardiologist (physician) and Advanced Practice Providers (APPs -  Physician Assistants and Nurse Practitioners) who all work together to provide you with the care you need, when you need it.  We recommend signing up for the patient portal called "MyChart".  Sign up information is provided on this After Visit Summary.  MyChart is used to connect with patients for Virtual Visits (Telemedicine).  Patients are able to view lab/test results, encounter notes, upcoming appointments, etc.  Non-urgent messages can be sent to your provider as well.   To learn more about what you can do with MyChart, go to [email or web address].    Your next appointment:   3 month(s)  The format for your next appointment:   In Person  Provider:   Buford Dresser, MD        Signed, Buford Dresser, MD PhD 09/07/2019    [place]

## 2019-10-05 ENCOUNTER — Ambulatory Visit: Payer: Medicare Other | Admitting: Cardiology

## 2019-10-24 ENCOUNTER — Other Ambulatory Visit: Payer: Self-pay

## 2019-10-24 ENCOUNTER — Ambulatory Visit (INDEPENDENT_AMBULATORY_CARE_PROVIDER_SITE_OTHER): Payer: Medicare Other

## 2019-10-24 DIAGNOSIS — Z23 Encounter for immunization: Secondary | ICD-10-CM

## 2019-11-04 ENCOUNTER — Encounter: Payer: Self-pay | Admitting: Cardiology

## 2019-11-09 ENCOUNTER — Other Ambulatory Visit: Payer: Self-pay

## 2019-11-09 ENCOUNTER — Encounter: Payer: Self-pay | Admitting: Family Medicine

## 2019-11-09 ENCOUNTER — Ambulatory Visit (INDEPENDENT_AMBULATORY_CARE_PROVIDER_SITE_OTHER): Payer: Medicare Other | Admitting: Family Medicine

## 2019-11-09 VITALS — BP 148/78 | HR 86 | Temp 98.1°F | Resp 14 | Ht 66.0 in | Wt 192.0 lb

## 2019-11-09 DIAGNOSIS — R21 Rash and other nonspecific skin eruption: Secondary | ICD-10-CM | POA: Diagnosis not present

## 2019-11-09 MED ORDER — TRIAMCINOLONE ACETONIDE 0.1 % EX CREA: 1.0000 "application " | TOPICAL_CREAM | Freq: Two times a day (BID) | CUTANEOUS | 1 refills | Status: DC

## 2019-11-09 NOTE — Progress Notes (Signed)
   Subjective:    Patient ID: Patrick Villa, male    DOB: March 11, 1933, 86 y.o.   MRN: [id number]  Patient presents for Rash (itchy irritation to lower abd on L side)   Pt here with rash for a few months, feels it under skin, ithcing more recently He used rubbing alcohol, the color of the skin seems to be changing some, its getting lighter at first was very dark No other rash     Review Of Systems:  GEN- denies fatigue, fever, weight loss,weakness, recent illness HEENT- denies eye drainage, change in vision, nasal discharge, CVS- denies chest pain, palpitations RESP- denies SOB, cough, wheeze ABD- denies N/V, change in stools, abd pain GU- denies dysuria, hematuria, dribbling, incontinence MSK- denies joint pain, muscle aches, injury Neuro- denies headache, dizziness, syncope, seizure activity       Objective:    BP (!) 148/78   Pulse 86   Temp 98.1 F (36.7 C) (Temporal)   Resp 14   Ht 5\' 6"  (1.676 m)   Wt 192 lb (87.1 kg)   SpO2 98%   BMI 30.99 kg/m  GEN- NAD, alert and oriented x3 CVS-RRR, no murmur RESP-CTAB ABD-NABS,soft,NT,ND Skin - left afbodmen -  3-4 cm long patch of hyperpigmented scaley maculopapular rash, +excoriations EXT- No edema Pulses- Radial, DP- 2+        Assessment & Plan:      Problem List Items Addressed This Visit    None    Visit Diagnoses    Skin rash    -  Primary   Dermatitis of some sort, has been present for awhile, treat wtih topical triamcinolone cream, if not improving, will need biospy in a few weeks       Note: This dictation was prepared with Dragon dictation along with smaller phrase technology. Any transcriptional errors that result from this process are unintentional.

## 2019-11-09 NOTE — Patient Instructions (Signed)
Use cream twice a day  If not improved in 2 weeks, schedule an appointment for a biopsy  F/U as needed

## 2019-11-11 ENCOUNTER — Encounter: Payer: Self-pay | Admitting: Family Medicine

## 2019-11-26 ENCOUNTER — Ambulatory Visit: Payer: Medicare Other | Admitting: Family Medicine

## 2019-11-27 ENCOUNTER — Other Ambulatory Visit: Payer: Self-pay

## 2019-11-27 ENCOUNTER — Ambulatory Visit (INDEPENDENT_AMBULATORY_CARE_PROVIDER_SITE_OTHER): Payer: Medicare Other | Admitting: Family Medicine

## 2019-11-27 ENCOUNTER — Encounter: Payer: Self-pay | Admitting: Family Medicine

## 2019-11-27 VITALS — BP 124/80 | HR 78 | Resp 14 | Ht 66.0 in | Wt 191.0 lb

## 2019-11-27 DIAGNOSIS — M79644 Pain in right finger(s): Secondary | ICD-10-CM

## 2019-11-27 DIAGNOSIS — G8929 Other chronic pain: Secondary | ICD-10-CM | POA: Diagnosis not present

## 2019-11-27 DIAGNOSIS — L309 Dermatitis, unspecified: Secondary | ICD-10-CM | POA: Diagnosis not present

## 2019-11-27 NOTE — Patient Instructions (Addendum)
Use cream twice a day for another week, use Aquaphor twice a day after cream applied  Referral to hand surgeon F/U as needed

## 2019-11-27 NOTE — Progress Notes (Signed)
   Subjective:    Patient ID: Patrick Villa, male    DOB: 09-May-1933, 86 y.o.   MRN: [id number]  Patient presents for Rash (Pt said that his rash is doing better. He said that it is not completely healed yet but has had improvement. )  Patient here for interim follow-up on skin rash/dermatitis.  He was seen for rash on his left lower quadrant he was prescribed triamcinolone in my plan was that that did not improve today he would have punch biopsy performed.  The rash has improved significantly.  He still has one sore spot but the bumps are no longer there.  Now the skin is a little dry but it has flattened out.  The intense itching has also stopped.  Also like referral to a hand specialist.  States for over a year he has had pain at the base of his right thumb.  Initially started after he was out in the yard getting up when he eats and such.  He does get some swelling from time to time.  He thinks that he may have had a cracked bone in the hand He has difficulty grasping things and twisting with the right hand.  Review Of Systems:  GEN- denies fatigue, fever, weight loss,weakness, recent illness HEENT- denies eye drainage, change in vision, nasal discharge, CVS- denies chest pain, palpitations RESP- denies SOB, cough, wheeze ABD- denies N/V, change in stools, abd pain MSK- + joint pain, muscle aches, injury Neuro- denies headache, dizziness, syncope, seizure activity       Objective:    BP 124/80   Pulse 78   Resp 14   Ht 5\' 6"  (1.676 m)   Wt 191 lb (86.6 kg)   SpO2 99%   BMI 30.83 kg/m  GEN- NAD, alert and oriented x3 Skin - left afbodmen -  3-4 cm long patch of hyperpigmented dry skin, previous rash resolved, NT MSK right hand, TTP base of right thumb, no swelling, fair grasp bilat, + finkelsteins right hand, sensation in tact  EXT- No edema Pulses- Radial 2+        Assessment & Plan:      Problem List Items Addressed This Visit    None    Visit Diagnoses     Dermatitis    -  Primary   Rash is significantly better, will use triacinolone another week and add aquaphor, biopsy not needed , due to chronic state expect skin will heal with hyperpigmentation   Chronic pain of right thumb       likley tendinitis from overuse, referral to hand specialist at pt request   Relevant Orders   Ambulatory referral to Hand Surgery      Note: This dictation was prepared with Dragon dictation along with smaller phrase technology. Any transcriptional errors that result from this process are unintentional.

## 2019-11-30 ENCOUNTER — Encounter: Payer: Self-pay | Admitting: Family Medicine

## 2019-11-30 ENCOUNTER — Other Ambulatory Visit: Payer: Self-pay

## 2019-11-30 ENCOUNTER — Ambulatory Visit (INDEPENDENT_AMBULATORY_CARE_PROVIDER_SITE_OTHER): Payer: Medicare Other | Admitting: Family Medicine

## 2019-11-30 ENCOUNTER — Telehealth: Payer: Self-pay | Admitting: Family Medicine

## 2019-11-30 ENCOUNTER — Ambulatory Visit: Payer: Self-pay

## 2019-11-30 DIAGNOSIS — M79641 Pain in right hand: Secondary | ICD-10-CM

## 2019-11-30 MED ORDER — DICLOFENAC SODIUM 1 % EX GEL: 4.0000 g | Freq: Four times a day (QID) | CUTANEOUS | 6 refills | Status: DC | PRN

## 2019-11-30 NOTE — Patient Instructions (Signed)
   Glucosamine Sulfate:  1,000 mg twice daily  Turmeric:  500 mg twice daily   

## 2019-11-30 NOTE — Telephone Encounter (Signed)
Left this info on the patient's home voice mail.

## 2019-11-30 NOTE — Telephone Encounter (Signed)
-----   Message from Buford Dresser, MD sent at 11/30/2019  1:37 PM EDT ----- That should be fine. Thanks. Buford Dresser ----- Message ----- From: Eunice Blase, MD Sent: 11/30/2019  11:10 AM EDT To: Buford Dresser, MD  Dr. Harrell Gave,  Is it ok for Mr. Kiedrowski to use voltaren gel on his hand?  [employer]

## 2019-11-30 NOTE — Progress Notes (Signed)
Office Visit Note   Patient: Patrick Villa           Date of Birth: 03/18/1933           MRN: [id number] Visit Date: 11/30/2019 Requested by: Alycia Rossetti, MD [address] [place],  [place] [address] PCP: Alycia Rossetti, MD  Subjective: Chief Complaint  Patient presents with  . Right Thumb - Pain    Pain x 1 year - started after raking up sweet gumballs from the yard. No sticking/popping. Right-hand dominant.    HPI: He is here with right hand pain.  He is right-hand dominant.  Symptoms started about a year ago, he thinks after breaking up sweet gum balls from his yard.  He began having pain in the thumb area and it has continued to hurt him since then.  He is not using anything for it.  He remains very active.  Denies any numbness or tingling in his hand.  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               ROS:   All other systems were reviewed and are negative.  Objective: Vital Signs: There were no vitals taken for this visit.  Physical Exam:  General:  Alert and oriented, in no acute distress. Pulm:  Breathing unlabored. Psy:  Normal mood, congruent affect. Skin: There is no erythema or bruising in his hand.  He has no thenar atrophy. Right hand: He is able to oppose his thumb and fifth finger and index finger with good strength.  He has decreased range of motion of his thumb IP joint and MCP joint.  He has positive grind test in the MCP and CMC joints of the thumb.  Negative Tinel's at the carpal tunnel, negative Phalen's test.  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  There is a rim of calcification near the MCP joint, question chondrocalcinosis.   Assessment & Plan: 1.  Chronic right hand pain most likely due to [facility] and MCP DJD of the thumb -Discussed options with him and elected  to give him a CMC unloading brace.  He felt better with it on. -He will also try glucosamine and turmeric. -If symptoms persist we will try hand therapy.  If that does not help, then injection. -I will ask his cardiologist whether he can use Voltaren gel for pain relief as well.     Procedures: No procedures performed  No notes on file     PMFS History: Patient Active Problem List   Diagnosis Date Noted  . ED (erectile dysfunction) 03/27/2019  . BPH (benign prostatic hyperplasia) 05/23/2018  . 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 Vitamin D deficiency 07/17/2014  . Uncontrolled hypertension   . COPD (chronic obstructive pulmonary disease) ([place])   . Hyperlipidemia   . Colon polyps   . Systolic murmur   . Ganglion cyst    Past Medical History:  Diagnosis Date  . Colon polyps   . COPD (chronic obstructive pulmonary disease) ([place])   . Echocardiogram abnormal   . Ganglion cyst   . Hematuria   . Hyperlipidemia   . Hypertension   . Systolic murmur     Family History  Problem Relation Age of Onset  .  Diabetes Mother   . Stroke Brother     Past Surgical History:  Procedure Laterality Date  . COLON SURGERY    . TRANSTHORACIC ECHOCARDIOGRAM     Social History   Occupational History  . Not on file  Tobacco Use  . Smoking status: Former Smoker    Quit date: 10/18/1981    Years since quitting: 38.1  . Smokeless tobacco: Never Used  Substance and Sexual Activity  . Alcohol use: No  . Drug use: No  . Sexual activity: Not on file

## 2019-12-06 IMAGING — CR DG CHEST 2V
2 series · 2 of 2 positions shown · non-contrast
Comparison: 12/10/2002 report.

CLINICAL DATA: Chest pain.

EXAM:
CHEST - 2 VIEW

[chest pa]
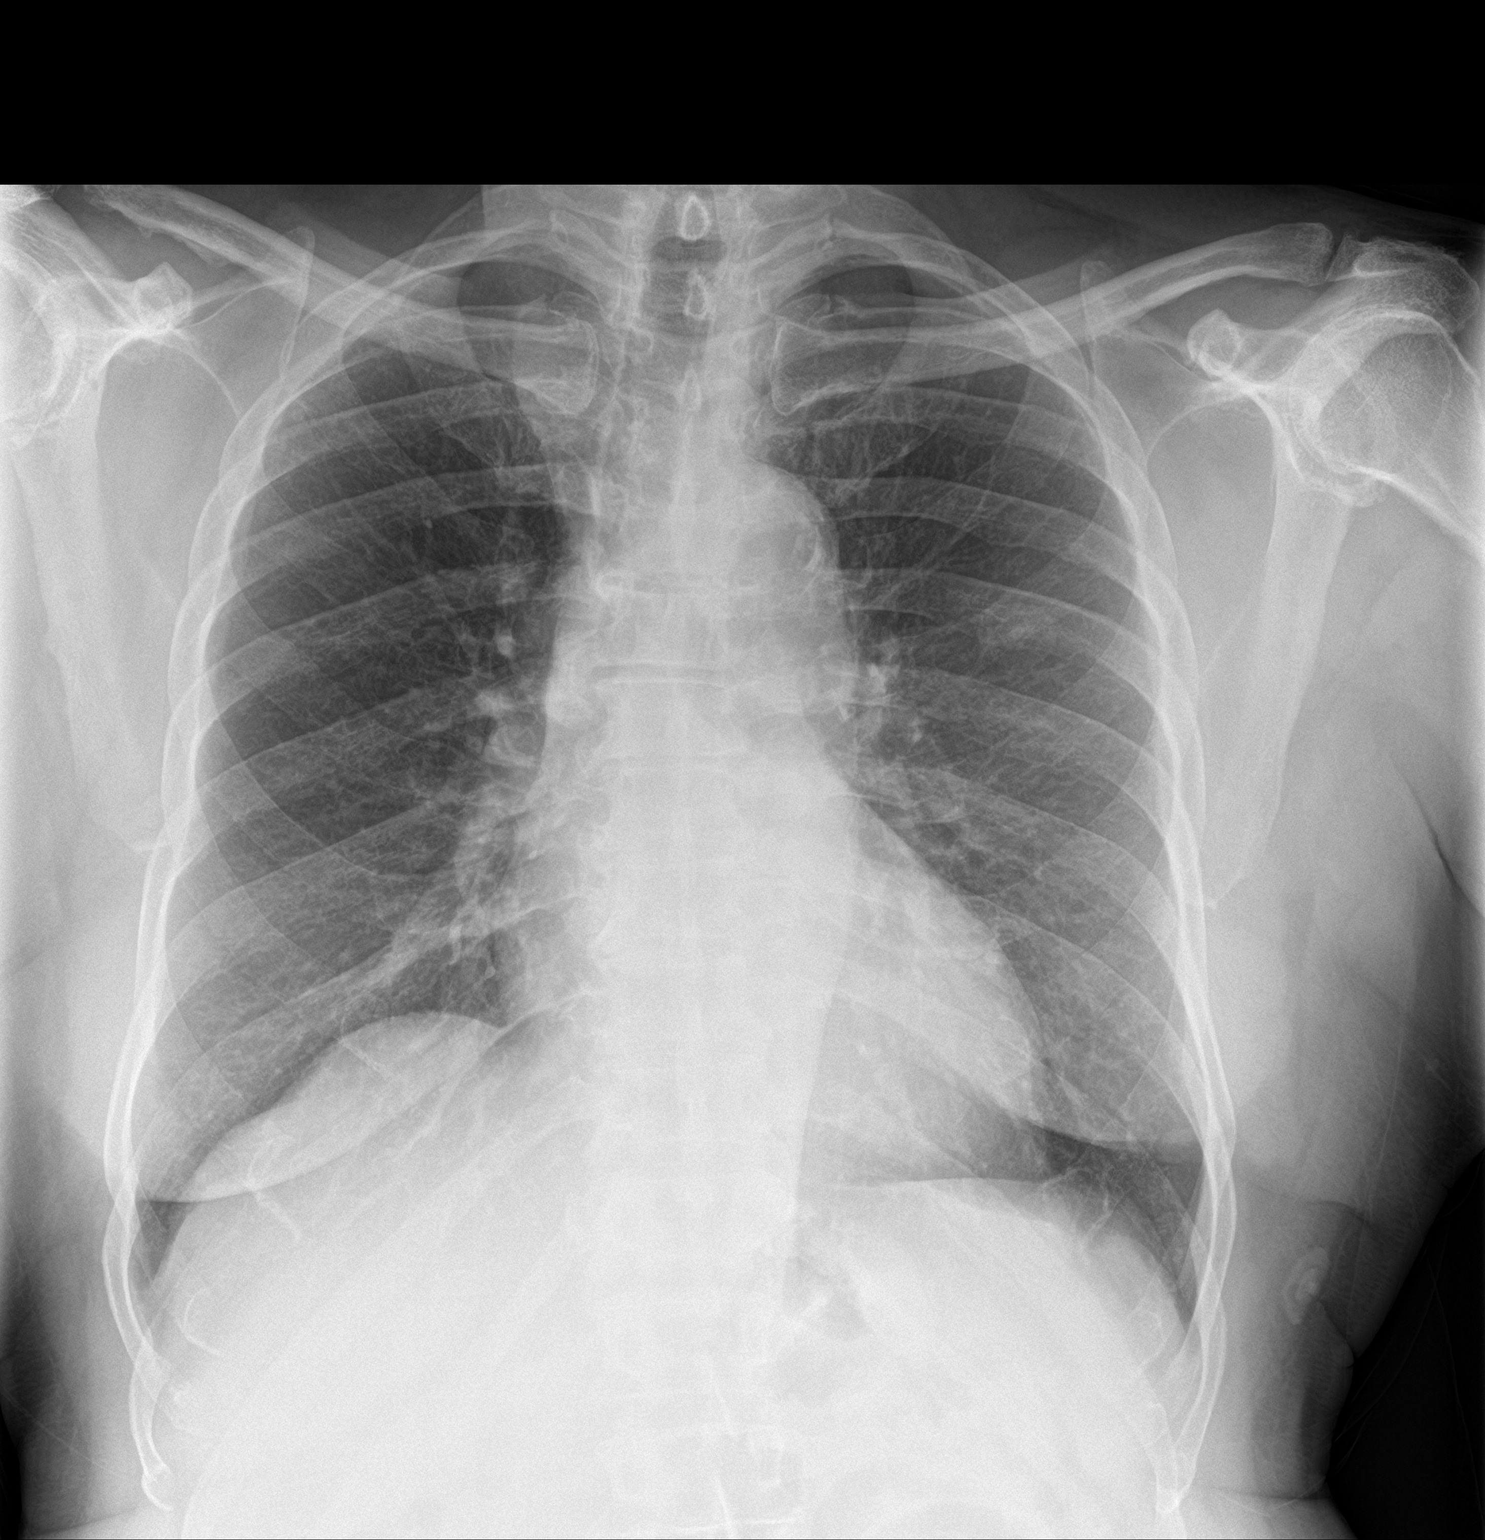

[chest lat]
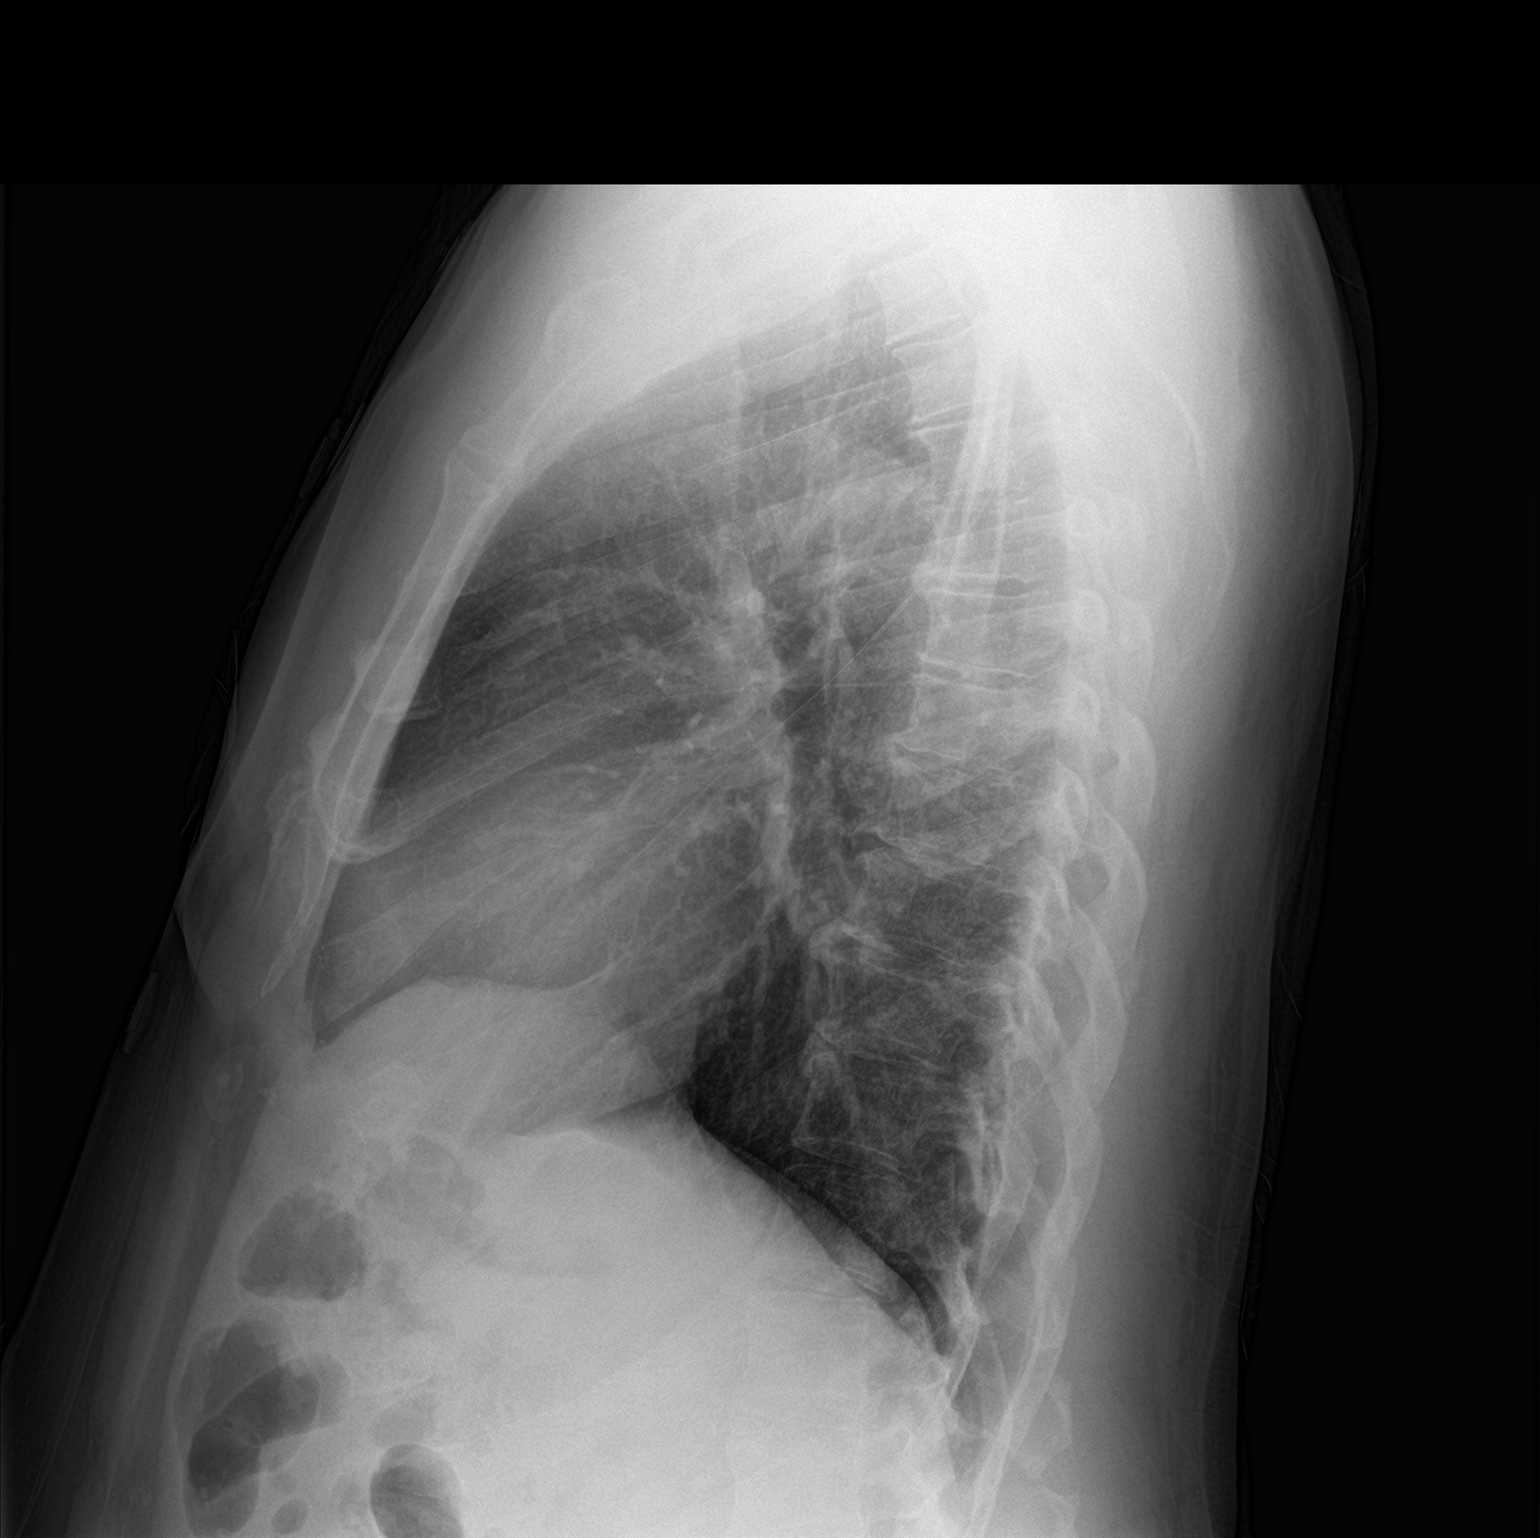

[2 of 2 positions shown; findings below may reference images not displayed]

FINDINGS: Mediastinum and hilar structures normal. Lungs are clear. No pleural
effusion or pneumothorax. EKG leads noted over the chest.
Degenerative change thoracic spine. Mild thoracic spine scoliosis.
IMPRESSION: No acute cardiopulmonary disease.

## 2019-12-10 ENCOUNTER — Ambulatory Visit: Payer: Medicare Other | Admitting: Cardiology

## 2019-12-10 ENCOUNTER — Encounter: Payer: Self-pay | Admitting: Cardiology

## 2019-12-10 ENCOUNTER — Other Ambulatory Visit: Payer: Self-pay

## 2019-12-10 VITALS — BP 178/84 | HR 93 | Ht 66.0 in | Wt 191.0 lb

## 2019-12-10 DIAGNOSIS — I441 Atrioventricular block, second degree: Secondary | ICD-10-CM | POA: Diagnosis not present

## 2019-12-10 DIAGNOSIS — R011 Cardiac murmur, unspecified: Secondary | ICD-10-CM

## 2019-12-10 DIAGNOSIS — E7800 Pure hypercholesterolemia, unspecified: Secondary | ICD-10-CM | POA: Diagnosis not present

## 2019-12-10 DIAGNOSIS — I10 Essential (primary) hypertension: Secondary | ICD-10-CM

## 2019-12-10 DIAGNOSIS — Z7189 Other specified counseling: Secondary | ICD-10-CM | POA: Diagnosis not present

## 2019-12-10 MED ORDER — BENAZEPRIL HCL 40 MG PO TABS: 40.0000 mg | ORAL_TABLET | Freq: Every day | ORAL | 3 refills | Status: DC

## 2019-12-10 NOTE — Progress Notes (Signed)
Cardiology Office Note:    Date:  12/10/2019   ID:  [name], DOB Jun 25, 1933, MRN [id number]  PCP:  Alycia Rossetti, MD  Cardiologist:  Buford Dresser, MD PhD  Referring MD: Alycia Rossetti, MD   CC: follow up of hypertension  History of Present Illness:    Patrick Villa is a 86 y.o. male with a hx of COPD, hyperlipidemia, hypertension, second degree AV block type I who is seen in follow up today. I initially saw him as a new consult 01/2018 at the request of [place], Modena Nunnery, MD for the evaluation and management of second degree, type I heart block.   He was seen in the hospital on 01/27/18 and 01/28/18 by Drs. Fredrik Rigger for bilateral breast tenderness. At that time, troponins were negative, echo was unremarkable. Cardizem was stopped due to second degree AV block, type I.   When he first saw me, we discussed an exercise treadmill stress test, but first goal was to get BP under better control. He initially met with our PharmD HTN clinic for this, but he did not return for follow up.   Today: Continues to have breast tenderness/skin burning. We discussed this again today.   Sometimes he can work all day, and other times feels like his breath isn't what it used to be. Doesn't limit him; able to do what he wants. Can walk up and down the hills in his yard, does get fatigued with walking back uphill.   Blood pressure was well controlled at office visit 11/2, but elevated today. Does endorse that he ate [place] takeout a few days ago and soup yesterday. Thinks this was high salt. Does check BP at home. Mostly 140-150/80s. Lower after he has been working outside.  Denies shortness of breath at rest. No PND, orthopnea, LE edema or unexpected weight gain. No syncope or palpitations. Rare lightheadedness when he first gets up in the morning.  Past Medical History:  Diagnosis Date  . Colon polyps   . COPD (chronic obstructive pulmonary disease) ([place])   . Echocardiogram  abnormal   . Ganglion cyst   . Hematuria   . Hyperlipidemia   . Hypertension   . Systolic murmur     Past Surgical History:  Procedure Laterality Date  . COLON SURGERY    . TRANSTHORACIC ECHOCARDIOGRAM      Current Medications: Current Outpatient Medications on File Prior to Visit  Medication Sig  . aspirin EC 81 MG tablet Take 1 tablet (81 mg total) by mouth daily.  . benazepril (LOTENSIN) 40 MG tablet Take 1 tablet (40 mg total) by mouth daily.  . Cholecalciferol (VITAMIN D) 2000 UNITS CAPS Take 1 capsule (2,000 Units total) by mouth daily.  . diclofenac Sodium (VOLTAREN) 1 % GEL Apply 4 g topically 4 (four) times daily as needed.  . finasteride (PROSCAR) 5 MG tablet TAKE 1 TABLET BY MOUTH  DAILY  . hydrALAZINE (APRESOLINE) 100 MG tablet Take 50 mg by mouth 2 (two) times daily.   Marland Kitchen terazosin (HYTRIN) 10 MG capsule TAKE 1 CAPSULE BY MOUTH  DAILY AT BEDTIME  . triamcinolone cream (KENALOG) 0.1 % Apply 1 application topically 2 (two) times daily.  . isosorbide mononitrate (IMDUR) 30 MG 24 hr tablet Take 1 tablet (30 mg total) by mouth daily.   No current facility-administered medications on file prior to visit.     Allergies:   Amlodipine, Lipitor [atorvastatin calcium], Zocor [simvastatin], Bactrim [sulfamethoxazole-trimethoprim], and Pravachol   Social  History   Tobacco Use  . Smoking status: Former Smoker    Quit date: 10/18/1981    Years since quitting: 38.1  . Smokeless tobacco: Never Used  Substance Use Topics  . Alcohol use: No  . Drug use: No    Family History: The patient's family history includes Diabetes in his mother; Stroke in his brother.  ROS:   Please see the history of present illness.  Additional pertinent ROS negative except as documented.    EKGs/Labs/Other Studies Reviewed:    The following studies were reviewed today: Echo 01/28/18 - Left ventricle: The cavity size was normal. Wall thickness was   normal. Systolic function was normal. The  estimated ejection   fraction was in the range of 55% to 60%. Wall motion was normal;   there were no regional wall motion abnormalities. The study is   not technically sufficient to allow evaluation of LV diastolic   function. - Atrial septum: There was an atrial septal aneurysm.  Impressions:  - Normal LV function; sclerotic aortic valve.  EKG:  EKG is personally reviewed.  The ekg ordered 02/02/19 demonstrates sinus rhythm, 2nd degree AV block type 1, LAFB, PRWP  Recent Labs: 07/23/2019: ALT 19; BUN 15; Creat 1.20; Hemoglobin 12.5; Platelets 226; Potassium 3.9; Sodium 140  Recent Lipid Panel    Component Value Date/Time   CHOL 178 03/02/2019 0938   TRIG 53 03/02/2019 0938   HDL 43 03/02/2019 0938   CHOLHDL 4.1 03/02/2019 0938   VLDL 18 01/28/2018 0538   LDLCALC 121 (H) 03/02/2019 0938    Physical Exam:    VS:  BP (!) 178/84   Pulse 93   Ht $R'5\' 6"'Cx$  (1.676 m)   Wt 191 lb (86.6 kg)   SpO2 97%   BMI 30.83 kg/m    Recheck 160/68  Wt Readings from Last 3 Encounters:  12/10/19 191 lb (86.6 kg)  11/27/19 191 lb (86.6 kg)  11/09/19 192 lb (87.1 kg)    GEN: Well nourished, well developed in no acute distress HEENT: Normal, moist mucous membranes NECK: No JVD CARDIAC: regular rhythm, normal S1 and S2, no rubs or gallops. 1/6 SE murmur. VASCULAR: Radial and DP pulses 2+ bilaterally. No carotid bruits RESPIRATORY:  Clear to auscultation without rales, wheezing or rhonchi  ABDOMEN: Soft, non-tender, non-distended MUSCULOSKELETAL:  Ambulates independently SKIN: Warm and dry, no edema NEUROLOGIC:  Alert and oriented x 3. No focal neuro deficits noted. PSYCHIATRIC:  Normal affect   ASSESSMENT:    1. Essential hypertension   2. Systolic murmur   3. Second degree AV block, Mobitz type I   4. Pure hypercholesterolemia   5. Cardiac risk counseling   6. Counseling on health promotion and disease prevention    PLAN:    Hypertension, has not been well controlled. We have  discussed extensively -remains above goal.  -he feels well with systolics in the 458K. I discussed that I would like it <140/90 and ideally <130/80 -we discussed multiple options today. He wishes to make no changes at this time as it was recently noted to be well controlled, but we will follow up closely -is on terazosin, benazepril, hydralazine, imdur  History of antihypertensives -tried amlodipine, had jitteriness/tremors. Had chest wall pain on spironolactone.  -no diltiazem/verapamil, beta blocker due to second degree AV block type 1 ([place]) -had aches/balance issues on chlorthalidone -had worsening chest wall pain on >50 mg hydralazine  Aspirin use: we discussed this. No bleeding issues. He feels that it treats  him well, would like to continue.  Murmur: aortic sclerosis without stenosis, no further workup needed. Echo 01/2018  History of chest discomfort: longstanding, atypical. Lexiscan normal. Mammogram normal.  CV risk counseling and prevention recommendations -recommend heart healthy/Mediterranean diet, with whole grains, fruits, vegetable, fish, lean meats, nuts, and olive oil. Limit salt. -recommend moderate walking, 3-5 times/week for 30-50 minutes each session. Aim for at least 150 minutes.week. Goal should be pace of 3 miles/hours, or walking 1.5 miles in 30 minutes -recommend avoidance of tobacco products. Avoid excess alcohol. -ASCVD risk score: The ASCVD Risk score Mikey Bussing DC Jr., et al., 2013) failed to calculate for the following reasons:   The 2013 ASCVD risk score is only valid for ages 45 to 7   Plan for follow up: 3 mos  Buford Dresser, MD, PhD [place]  [facility]   Medication Adjustments/Labs and Tests Ordered: Current medicines are reviewed at length with the patient today.  Concerns regarding medicines are outlined above.  No orders of the defined types were placed in this encounter.  Meds ordered this encounter  Medications  .  benazepril (LOTENSIN) 40 MG tablet    Sig: Take 1 tablet (40 mg total) by mouth daily.    Dispense:  90 tablet    Refill:  3    Patient Instructions  Medication Instructions:  Your Physician recommend you continue on your current medication as directed.    *If you need a refill on your cardiac medications before your next appointment, please call your pharmacy*   Lab Work: None   Testing/Procedures: None   Follow-Up: At [facility], you and your health needs are our priority.  As part of our continuing mission to provide you with exceptional heart care, we have created designated Provider Care Teams.  These Care Teams include your primary Cardiologist (physician) and Advanced Practice Providers (APPs -  Physician Assistants and Nurse Practitioners) who all work together to provide you with the care you need, when you need it.  We recommend signing up for the patient portal called "MyChart".  Sign up information is provided on this After Visit Summary.  MyChart is used to connect with patients for Virtual Visits (Telemedicine).  Patients are able to view lab/test results, encounter notes, upcoming appointments, etc.  Non-urgent messages can be sent to your provider as well.   To learn more about what you can do with MyChart, go to [email or web address].    Your next appointment:   3 month(s)  The format for your next appointment:   In Person  Provider:   Buford Dresser, MD  Your physician recommends that you schedule a follow-up appointment in 6-8 weeks with Pharm D.         Signed, Buford Dresser, MD PhD 12/10/2019    [place]

## 2019-12-10 NOTE — Patient Instructions (Signed)
Medication Instructions:  Your Physician recommend you continue on your current medication as directed.    *If you need a refill on your cardiac medications before your next appointment, please call your pharmacy*   Lab Work: None   Testing/Procedures: None   Follow-Up: At [facility], you and your health needs are our priority.  As part of our continuing mission to provide you with exceptional heart care, we have created designated Provider Care Teams.  These Care Teams include your primary Cardiologist (physician) and Advanced Practice Providers (APPs -  Physician Assistants and Nurse Practitioners) who all work together to provide you with the care you need, when you need it.  We recommend signing up for the patient portal called "MyChart".  Sign up information is provided on this After Visit Summary.  MyChart is used to connect with patients for Virtual Visits (Telemedicine).  Patients are able to view lab/test results, encounter notes, upcoming appointments, etc.  Non-urgent messages can be sent to your provider as well.   To learn more about what you can do with MyChart, go to [email or web address].    Your next appointment:   3 month(s)  The format for your next appointment:   In Person  Provider:   Buford Dresser, MD  Your physician recommends that you schedule a follow-up appointment in 6-8 weeks with Pharm D.

## 2019-12-24 DIAGNOSIS — H25811 Combined forms of age-related cataract, right eye: Secondary | ICD-10-CM | POA: Diagnosis not present

## 2019-12-24 DIAGNOSIS — H1013 Acute atopic conjunctivitis, bilateral: Secondary | ICD-10-CM | POA: Diagnosis not present

## 2019-12-24 DIAGNOSIS — H25812 Combined forms of age-related cataract, left eye: Secondary | ICD-10-CM | POA: Diagnosis not present

## 2020-01-02 ENCOUNTER — Ambulatory Visit: Payer: Self-pay | Admitting: Pharmacist

## 2020-01-02 NOTE — Chronic Care Management (AMB) (Signed)
Chronic Care Management   Follow Up Note   01/02/2020 Name: Patrick Villa MRN: [id number] DOB: March 25, 1933  Referred by: Alycia Rossetti, MD Reason for referral : No chief complaint on file.   Patrick Villa is a 86 y.o. year old male who is a primary care patient of [place], Modena Nunnery, MD. The CCM team was consulted for assistance with chronic disease management and care coordination needs.    Review of patient status, including review of consultants reports, relevant laboratory and other test results, and collaboration with appropriate care team members and the patient's provider was performed as part of comprehensive patient evaluation and provision of chronic care management services.    SDOH (Social Determinants of Health) assessments performed: No See Care Plan activities for detailed interventions related to [facility])     Outpatient Encounter Medications as of 01/02/2020  Medication Sig  . aspirin EC 81 MG tablet Take 1 tablet (81 mg total) by mouth daily.  . benazepril (LOTENSIN) 40 MG tablet Take 1 tablet (40 mg total) by mouth daily.  . Cholecalciferol (VITAMIN D) 2000 UNITS CAPS Take 1 capsule (2,000 Units total) by mouth daily.  . diclofenac Sodium (VOLTAREN) 1 % GEL Apply 4 g topically 4 (four) times daily as needed.  . finasteride (PROSCAR) 5 MG tablet TAKE 1 TABLET BY MOUTH  DAILY  . hydrALAZINE (APRESOLINE) 100 MG tablet Take 50 mg by mouth 2 (two) times daily.   . isosorbide mononitrate (IMDUR) 30 MG 24 hr tablet Take 1 tablet (30 mg total) by mouth daily.  Marland Kitchen terazosin (HYTRIN) 10 MG capsule TAKE 1 CAPSULE BY MOUTH  DAILY AT BEDTIME  . triamcinolone cream (KENALOG) 0.1 % Apply 1 application topically 2 (two) times daily.   No facility-administered encounter medications on file as of 01/02/2020.    Reviewed chart prior to disease state call. Spoke with patient regarding BP Recent OV's 12/10/2019 (cardiology) - BP was controlled at visit on 11/2, however elevated fairly  severely today.  Admits to eating high salt diet lately and reports home BP has been 140-150s/80s.  Denies SOB, edema, reports rare lightheadedness when he gets up in the morning.  Recent Office Vitals: BP Readings from Last 3 Encounters:  12/10/19 (!) 178/84  11/27/19 124/80  11/09/19 (!) 148/78   Pulse Readings from Last 3 Encounters:  12/10/19 93  11/27/19 78  11/09/19 86    Wt Readings from Last 3 Encounters:  12/10/19 191 lb (86.6 kg)  11/27/19 191 lb (86.6 kg)  11/09/19 192 lb (87.1 kg)     Kidney Function Lab Results  Component Value Date/Time   CREATININE 1.20 (H) 07/23/2019 10:59 AM   CREATININE 1.21 (H) 03/02/2019 09:38 AM   GFRNONAA 57 (L) 02/02/2019 10:26 AM   GFRNONAA 57 (L) 11/02/2017 12:55 PM   GFRAA >60 02/02/2019 10:26 AM   GFRAA 66 11/02/2017 12:55 PM    BMP Latest Ref Rng & Units 07/23/2019 03/02/2019 02/26/2019  Glucose 65 - 99 mg/dL 147(H) 97 121(H)  BUN 7 - 25 mg/dL 15 19 15   Creatinine 0.70 - 1.11 mg/dL 1.20(H) 1.21(H) 1.27(H)  BUN/Creat Ratio 6 - 22 (calc) 13 16 12   Sodium 135 - 146 mmol/L 140 141 141  Potassium 3.5 - 5.3 mmol/L 3.9 3.9 4.3  Chloride 98 - 110 mmol/L 105 103 106  CO2 20 - 32 mmol/L 28 25 30   Calcium 8.6 - 10.3 mg/dL 9.9 10.2 9.7    . Current antihypertensive regimen:  o Benazepril 40mg   o Hydralazine 50mg  twice daily o Imdur 30mg  daily o Terazosin 10mg  hs . How often are you checking your Blood Pressure? daily . Current home BP readings: 140s-160s/80-90 . What recent interventions/DTPs have been made by any provider to improve Blood Pressure control since last CPP Visit: None, regular follow up with cardiologist . Any recent hospitalizations or ED visits since last visit with CPP? No . What diet changes have been made to improve Blood Pressure Control?  o Patient is still monitoring his salt intake and working to eliminate the amount of fast foods and processed foods he eats . What exercise is being done to improve your Blood  Pressure Control?  o Reports he is always exercising, walking around the house, still active  Adherence Review: Is the patient currently on ACE/ARB medication? Yes Does the patient have >5 day gap between last estimated fill dates? Yes    Last fill date of Imdur 30mg  was 09/07/2019 - however patient confirms he has supply on hand and is taking every morning  Hydralazine does not show up on fill history, however patient reports that he has supply on hand of 25mg  and he is taking two 25mg  twice daily to get the prescribed 50mg  dose  Confirms he has all four BP meds on hand and is taking as prescribed  Reminded patient of upcoming visit with PharmD on 02/27/20 over the telephone.  Beverly Milch, PharmD Clinical Pharmacist [place] [phone]

## 2020-01-11 DIAGNOSIS — H25811 Combined forms of age-related cataract, right eye: Secondary | ICD-10-CM | POA: Diagnosis not present

## 2020-01-11 DIAGNOSIS — Z01818 Encounter for other preprocedural examination: Secondary | ICD-10-CM | POA: Diagnosis not present

## 2020-01-11 DIAGNOSIS — H2511 Age-related nuclear cataract, right eye: Secondary | ICD-10-CM | POA: Diagnosis not present

## 2020-01-28 ENCOUNTER — Ambulatory Visit (INDEPENDENT_AMBULATORY_CARE_PROVIDER_SITE_OTHER): Payer: Medicare Other | Admitting: Pharmacist Clinician (PhC)/ Clinical Pharmacy Specialist

## 2020-01-28 ENCOUNTER — Other Ambulatory Visit: Payer: Self-pay

## 2020-01-28 VITALS — BP 144/76 | HR 70 | Ht 66.0 in | Wt 190.2 lb

## 2020-01-28 DIAGNOSIS — I10 Essential (primary) hypertension: Secondary | ICD-10-CM

## 2020-01-28 MED ORDER — CHLORTHALIDONE 25 MG PO TABS: 25.0000 mg | ORAL_TABLET | Freq: Every day | ORAL | 3 refills | Status: DC

## 2020-01-28 MED ORDER — HYDRALAZINE HCL 50 MG PO TABS: 50.0000 mg | ORAL_TABLET | Freq: Two times a day (BID) | ORAL | 3 refills | Status: DC

## 2020-01-28 NOTE — Assessment & Plan Note (Signed)
Patient with uncontrolled hypertension, states doing better at home.  Has no readings with him, but states nothing as high as 160 systolic.  Discussed options including increasing hydralazine to three times daily or increasing chlorthalidone to a whole 25 mg tablet.  Patient would prefer to increase the chlorthalidone.  Will have him go to 25 mg daily and repeat metabolic panel in about 2 weeks.  He is scheduled to see Dr. Cristal Deer in February.  He was given log sheets to write down home readings and stressed that he should bring this back to the office in February.

## 2020-01-28 NOTE — Progress Notes (Signed)
01/28/2020 Patrick Villa Dec 17, 1933 [id number]   HPI:  Patrick Villa is a 87 y.o. male patient of Dr Cristal Deer, with a PMH below who presents today for hypertension clinic evaluation.  In addition to hypertension his medical history is significant for COPD, hyperlipidemia, and second degree AV block (type 1).  He was hospitalized overnight in January 2020 for chest pain, which was when the AV block was noted.  He had been on diltiazem for his blood pressure, but that was discontinued on discharge.  He most recently saw Dr. Cristal Deer in November, at which time his pressure was noted to be 178/84.  He has had sensitivities to multiple medications in the past (see below), making it more challenging to find the right combination that will bring his pressure down.    Today he returns for follow up.  He doesn't have any home readings with him, but notes that he gets up into the 150's in the mornings, but afternoons/evenings are 10-20 points lower.  He states feeling well overall and compliance with all medications.  His medication list did not mention chlorthalidone, but he states has been taking 12.5 mg (1/2 tablet) daily for the last several months.   Blood Pressure Goal:  130/80  Current Medications:  Benazepril 40 mg (am)  Hydralazine 50 mg bid  Terazosin 10 mg qhs  Chlorthalidone 12.5 mg (pm)  Family Hx:  Mother died at 61, "heart disease", hypertension, DM  Father died at 59  3 siblings, 1 sister with heart disease, 1 brother deceased (asthma)  1 daughter no health issues  Social Hx:  No tobacco, quit 40 years ago; occasional social alcohol; coffee occasionally, pepsi or mt dew couple of times per week.  Diet:  Mostly home cooked, occasional fried foods; mostly fresh or frozen veggies; nuts and popcorn; salsa and chips;  Fried fish most fridays;   Exercise:  Always up and moving, takes care of yard, tinkers in garage all day, doesn't sit still much  Home BP  readings:  Fluctuates a lot 127-158, nothing over 160; diastolic 70-80's (AM), evenings tend to trend a little lower but not significantly  Intolerances:  CCB/BB - cannot take due to AV block  Spironolactone - avoid due to chest wall pain  Chlorthalidone - aches and balance issues (? - has been taking 12.5 mg)  Amlodipine - jitteriness, tremors, unstable gait, AV block  Hydralazine in doses > 50 mg increases chest wall pain   Labs:  07/23/2019: Na 140, K 3.9, Glu 147, BUN 15, SCr 1.20  01/27/2018:  Na 140, K 3.7, Glu 117, BUN 12, SCr 1.08, GFR > 60  Wt Readings from Last 3 Encounters:  01/28/20 190 lb 3.2 oz (86.3 kg)  12/10/19 191 lb (86.6 kg)  11/27/19 191 lb (86.6 kg)   BP Readings from Last 3 Encounters:  01/28/20 (!) 144/76  12/10/19 (!) 178/84  11/27/19 124/80   Pulse Readings from Last 3 Encounters:  01/28/20 70  12/10/19 93  11/27/19 78    Current Outpatient Medications  Medication Sig Dispense Refill  . aspirin EC 81 MG tablet Take 1 tablet (81 mg total) by mouth daily. 30 tablet 11  . benazepril (LOTENSIN) 40 MG tablet Take 1 tablet (40 mg total) by mouth daily. 90 tablet 3  . chlorthalidone (HYGROTON) 25 MG tablet Take 1 tablet (25 mg total) by mouth daily. 90 tablet 3  . Cholecalciferol (VITAMIN D) 2000 UNITS CAPS Take 1 capsule (2,000 Units total)  by mouth daily. 30 capsule 11  . diclofenac Sodium (VOLTAREN) 1 % GEL Apply 4 g topically 4 (four) times daily as needed. 500 g 6  . finasteride (PROSCAR) 5 MG tablet TAKE 1 TABLET BY MOUTH  DAILY 90 tablet 3  . hydrALAZINE (APRESOLINE) 50 MG tablet Take 1 tablet (50 mg total) by mouth in the morning and at bedtime. 180 tablet 3  . isosorbide mononitrate (IMDUR) 30 MG 24 hr tablet Take 1 tablet (30 mg total) by mouth daily. 90 tablet 3  . terazosin (HYTRIN) 10 MG capsule TAKE 1 CAPSULE BY MOUTH  DAILY AT BEDTIME 90 capsule 3  . triamcinolone cream (KENALOG) 0.1 % Apply 1 application topically 2 (two) times daily. 30 g 1    No current facility-administered medications for this visit.    Allergies  Allergen Reactions  . Amlodipine Other (See Comments)    Patient developed jitteriness, tremors and unstable gait.  Same issues when re-challenged  . Lipitor [Atorvastatin Calcium]     Weakness.  . Zocor [Simvastatin]   . Bactrim [Sulfamethoxazole-Trimethoprim] Rash  . Pravachol Hives and Rash    Past Medical History:  Diagnosis Date  . Colon polyps   . COPD (chronic obstructive pulmonary disease) ([place])   . Echocardiogram abnormal   . Ganglion cyst   . Hematuria   . Hyperlipidemia   . Hypertension   . Systolic murmur     Blood pressure (!) 144/76, pulse 70, height 5\' 6"  (1.676 m), weight 190 lb 3.2 oz (86.3 kg).  Uncontrolled hypertension Patient with uncontrolled hypertension, states doing better at home.  Has no readings with him, but states nothing as high as 0000000 systolic.  Discussed options including increasing hydralazine to three times daily or increasing chlorthalidone to a whole 25 mg tablet.  Patient would prefer to increase the chlorthalidone.  Will have him go to 25 mg daily and repeat metabolic panel in about 2 weeks.  He is scheduled to see Dr. Harrell Gave in February.  He was given log sheets to write down home readings and stressed that he should bring this back to the office in February.     Tommy Medal PharmD CPP [facility] [address] [place], [place] [address] [phone]

## 2020-01-28 NOTE — Patient Instructions (Signed)
Return for a a follow up appointment with Dr. Cristal Deer on February 15  Go to the lab in 2 weeks - around 15th-20th of January - to check potassium and kidney function  Check your blood pressure at home daily and keep record of the readings.  Take your BP meds as follows:  Increase chlorthalidone to 1 tablet (25 mg) once daily  Continue with all other medications  Bring all of your meds, your BP cuff and your record of home blood pressures to your next appointment.  Exercise as you're able, try to walk approximately 30 minutes per day.  Keep salt intake to a minimum, especially watch canned and prepared boxed foods.  Eat more fresh fruits and vegetables and fewer canned items.  Avoid eating in fast food restaurants.    HOW TO TAKE YOUR BLOOD PRESSURE: . Rest 5 minutes before taking your blood pressure. .  Don't smoke or drink caffeinated beverages for at least 30 minutes before. . Take your blood pressure before (not after) you eat. . Sit comfortably with your back supported and both feet on the floor (don't cross your legs). . Elevate your arm to heart level on a table or a desk. . Use the proper sized cuff. It should fit smoothly and snugly around your bare upper arm. There should be enough room to slip a fingertip under the cuff. The bottom edge of the cuff should be 1 inch above the crease of the elbow. . Ideally, take 3 measurements at one sitting and record the average.

## 2020-01-31 DIAGNOSIS — H25812 Combined forms of age-related cataract, left eye: Secondary | ICD-10-CM | POA: Diagnosis not present

## 2020-01-31 DIAGNOSIS — H2512 Age-related nuclear cataract, left eye: Secondary | ICD-10-CM | POA: Diagnosis not present

## 2020-02-27 ENCOUNTER — Ambulatory Visit: Payer: Self-pay | Admitting: Pharmacist

## 2020-02-27 DIAGNOSIS — E7800 Pure hypercholesterolemia, unspecified: Secondary | ICD-10-CM

## 2020-02-27 DIAGNOSIS — I10 Essential (primary) hypertension: Secondary | ICD-10-CM

## 2020-02-27 NOTE — Chronic Care Management (AMB) (Signed)
Chronic Care Management Pharmacy  Name: JOSEF TOURIGNY  MRN: [id number] DOB: 06/09/1933  Chief Complaint/ HPI  Patrick Villa,  87 y.o. , male presents for their Follow-Up CCM visit with the clinical pharmacist In office.  PCP : Alycia Rossetti, MD  Their chronic conditions include: hypertension, COPD, hyperlipidemia, vitamin D Deficiency.  Office Visits: 11/27/19 [facility]) - triamcinolone given for dermatitis.  07/23/2019 [facility]) - unintentional weight loss  Patient concerned about cancer  Treated for H. Pylori  Mammogram and CT scan ordered - mammogram negative  03/27/2019 [facility]) - BP follow uup  Continued all medications  Given sildenafil 60m to take for ED  03/02/2019 ([facility] - BP follow up  Not very compliant with BP meds  Given clonidine 0.189mto take if systolic BP > 20010Back to hydralazine 50 mg tid  Consult Visit: 01/28/20 ([place]) - chlorthalidone increased to 2553maily and patient was to log his blood pressure at home.  CMP ordered for two weeks.  Medications: Outpatient Encounter Medications as of 02/27/2020  Medication Sig  . aspirin EC 81 MG tablet Take 1 tablet (81 mg total) by mouth daily.  . benazepril (LOTENSIN) 40 MG tablet Take 1 tablet (40 mg total) by mouth daily.  . chlorthalidone (HYGROTON) 25 MG tablet Take 1 tablet (25 mg total) by mouth daily.  . Cholecalciferol (VITAMIN D) 2000 UNITS CAPS Take 1 capsule (2,000 Units total) by mouth daily.  . diclofenac Sodium (VOLTAREN) 1 % GEL Apply 4 g topically 4 (four) times daily as needed.  . finasteride (PROSCAR) 5 MG tablet TAKE 1 TABLET BY MOUTH  DAILY  . hydrALAZINE (APRESOLINE) 50 MG tablet Take 1 tablet (50 mg total) by mouth in the morning and at bedtime.  . tMarland Kitchenrazosin (HYTRIN) 10 MG capsule TAKE 1 CAPSULE BY MOUTH  DAILY AT BEDTIME  . triamcinolone cream (KENALOG) 0.1 % Apply 1 application topically 2 (two) times daily.  . [DISCONTINUED] isosorbide mononitrate (IMDUR) 30  MG 24 hr tablet Take 1 tablet (30 mg total) by mouth daily.   No facility-administered encounter medications on file as of 02/27/2020.     Current Diagnosis/Assessment: FinEmergency planning/management officerrain: Low Risk   . Difficulty of Paying Living Expenses: Not very hard    Goals Addressed            This Visit's Progress   . Pharmacy Care Plan:       CARE PLAN ENTRY (see longitudinal plan of care for additional care plan information)  Current Barriers:  . Chronic Disease Management support, education, and care coordination needs related to Hypertension and Hyperlipidemia   Hypertension BP Readings from Last 3 Encounters:  01/28/20 (!) 144/76  12/10/19 (!) 178/84  11/27/19 124/80   . Pharmacist Clinical Goal(s): o Over the next  180 days, patient will work with PharmD and providers to achieve BP goal <140/90 . Current regimen:  o Benazepril 57m23mily o Hydralazine 50mg66mce daily o Chlorthalidone 25mg 79my . Interventions: o Reviewed home blood pressure monitoring o Discussed importance of medication adherence . Patient self care activities - Over the next 180 days, patient will: o Check BP daily, document, and provide at future appointments o Ensure daily salt intake < 2300 mg/day   Hyperlipidemia Lab Results  Component Value Date/Time   LDLCALC 121 (H) 03/02/2019 09:38 AM   . Pharmacist Clinical Goal(s): o Over the next 180 days, patient will work with PharmD and providers to maintain LDL goal < 100 .  Current regimen:  o No medications . Interventions: o Counseled on importance of diet low in saturated fats, especially with increased blood pressure o Recommend update lipid panel . Patient self care activities - Over the next 180 days, patient will: o Continue to work on diet high in fresh vegetables and low in saturated fats o Remain active in garden and workshop  Please see past updates related to this goal by clicking on the "Past Updates" button in the selected  goal         Hyperlipidemia   LDL goal < 100  Lipid Panel     Component Value Date/Time   CHOL 178 03/02/2019 0938   TRIG 53 03/02/2019 0938   HDL 43 03/02/2019 0938   LDLCALC 121 (H) 03/02/2019 0938    Hepatic Function Latest Ref Rng & Units 07/23/2019 08/25/2018 01/27/2018  Total Protein 6.1 - 8.1 g/dL 6.6 6.8 6.9  Albumin 3.5 - 5.0 g/dL - - 3.6  AST 10 - 35 U/L _0 ALT 9 - 46 U/L _1 Alk Phosphatase 38 - 126 U/L - - 36(L)  Total Bilirubin 0.2 - 1.2 mg/dL 0.3 0.3 0.5  Bilirubin, Direct 0.0 - 0.2 mg/dL - - <0.1     The ASCVD Risk score ([place]., et al., 2013) failed to calculate for the following reasons:   The 2013 ASCVD risk score is only valid for ages 71 to 41   Patient has failed these meds in past: atorvastatin, simvastatin, pravastatin  Patient is currently uncontrolled on the following medications:  . No medications  Patient eats fresh vegetables from garden, very active gardening and in shop at his home daily. LDL slightly elevated.  Discussed diet low in saturated fats to bring down LDL.  Patient has tried and failed multiple statins.  Will follow given current age.  Update 02/27/20 Would benefit from updated lipid panel History of myalgias with statins Remains active in his shop and around the house  Plan  Continue to control with diet and exercise.   Recommend updated lipids  Hypertension    Office blood pressures are  BP Readings from Last 3 Encounters:  01/28/20 (!) 144/76  12/10/19 (!) 178/84  11/27/19 124/80    Patient has failed these meds in the past: chlorthalidone, amlodipine  Patient checks BP at home daily  Patient home BP readings are ranging: 150s-170s/80-90  Patient is currently uncontrolled on the following medications:   Chlorthalidone 46m daily  Benazepril 446mdaily  Hydralazine 5042mid  Patient reports chlorthalidone made his whole body hurt and all he wanted to do was sit on the couch so he is no  longer taking this.  Hydralazine he is currently taking 55m25mice daily.  Any more than that causes him to have chest pain/burning sensation in his chest.  Mammogram was negative.  Tolerated 55mg61mce daily dose.  Monitors daily, reports highest lately was one random BP that was 170s 308Molic.  Counseled on importance of taking his meds daily. Would really like to see lower BP however with non-compliance and allergies this may be hard to achieve.    Update 02/27/20 He reports average BP from 140-150/70-80 Did not have specific readings for me to review as he was not in his house Started full tablet of chlorthalidone 25mg 66mtolerating with no issues Future plans would be to up titrate on his hydralazine if BP remains elevated Monitoring plan in place Will follow closely  Plan  Continue current medications  Continue at home monitoring and recording    Vitamin D Deficiency   Last Vit D one year ago - 40 (WNL)  Patient has failed these meds in past: none noted Patient is currently controlled on the following medications:   Vitamin D 2000 IU daily  Controlled on OTC supplementation.  Due for repeat Vit D.  Plan  Continue current medications, recommend repeat Vitamin D.  Vaccines   Reviewed and discussed patient's vaccination history.    Immunization History  Administered Date(s) Administered  . Fluad Quad(high Dose 65+) 10/19/2018, 10/24/2019  . Influenza Whole 11/10/2009  . Influenza, High Dose Seasonal PF 11/01/2016, 11/02/2017  . Influenza,inj,Quad PF,6+ Mos 10/18/2012, 01/02/2014  . Influenza-Unspecified 10/15/2014, 11/10/2015  . PFIZER(Purple Top)SARS-COV-2 Vaccination 02/14/2019, 03/07/2019  . Pneumococcal Conjugate-13 05/31/2013  . Pneumococcal Polysaccharide-23 11/30/2010  . Td 05/26/2010  . Tdap 05/26/2010    Plan  Recommended patient receive shingles vaccine in office.  Medication Management   . Miscellaneous medications:  o Terazosin 35m  hs o Finasteride 537mdaily . OTC's:  o ASA 8168maily . [facility]  Phone #  [phone] reports using vials method to organize medications and promote adherence. . Patient denies missed doses of medication.   ChrBeverly MilchharmD Clinical Pharmacist [phone]

## 2020-02-27 NOTE — Patient Instructions (Addendum)
Visit Information  Goals Addressed            This Visit's Progress   . Pharmacy Care Plan:       CARE PLAN ENTRY (see longitudinal plan of care for additional care plan information)  Current Barriers:  . Chronic Disease Management support, education, and care coordination needs related to Hypertension and Hyperlipidemia   Hypertension BP Readings from Last 3 Encounters:  01/28/20 (!) 144/76  12/10/19 (!) 178/84  11/27/19 124/80   . Pharmacist Clinical Goal(s): o Over the next  180 days, patient will work with PharmD and providers to achieve BP goal <140/90 . Current regimen:  o Benazepril 40mg  daily o Hydralazine 50mg  twice daily o Chlorthalidone 25mg  daily . Interventions: o Reviewed home blood pressure monitoring o Discussed importance of medication adherence . Patient self care activities - Over the next 180 days, patient will: o Check BP daily, document, and provide at future appointments o Ensure daily salt intake < 2300 mg/day   Hyperlipidemia Lab Results  Component Value Date/Time   LDLCALC 121 (H) 03/02/2019 09:38 AM   . Pharmacist Clinical Goal(s): o Over the next 180 days, patient will work with PharmD and providers to maintain LDL goal < 100 . Current regimen:  o No medications . Interventions: o Counseled on importance of diet low in saturated fats, especially with increased blood pressure o Recommend update lipid panel . Patient self care activities - Over the next 180 days, patient will: o Continue to work on diet high in fresh vegetables and low in saturated fats o Remain active in garden and workshop  Please see past updates related to this goal by clicking on the "Past Updates" button in the selected goal         The patient verbalized understanding of instructions, educational materials, and care plan provided today and agreed to receive a mailed copy of patient instructions, educational materials, and care plan.   Telephone follow up  appointment with pharmacy team member scheduled for: 4 months  Edythe Clarity, [facility]  Preventing High Cholesterol Cholesterol is a white, waxy substance similar to fat that the human body needs to help build cells. The liver makes all the cholesterol that a person's body needs. Having high cholesterol (hypercholesterolemia) increases your risk for heart disease and stroke. Extra or excess cholesterol comes from the food that you eat. High cholesterol can often be prevented with diet and lifestyle changes. If you already have high cholesterol, you can control it with diet, lifestyle changes, and medicines. How can high cholesterol affect me? If you have high cholesterol, fatty deposits (plaques) may build up on the walls of your blood vessels. The blood vessels that carry blood away from your heart are called arteries. Plaques make the arteries narrower and stiffer. This in turn can:  Restrict or block blood flow and cause blood clots to form.  Increase your risk for heart attack and stroke. What can increase my risk for high cholesterol? This condition is more likely to develop in people who:  Eat foods that are high in saturated fat or cholesterol. Saturated fat is mostly found in foods that come from animal sources.  Are overweight.  Are not getting enough exercise.  Have a family history of high cholesterol (familial hypercholesterolemia). What actions can I take to prevent this? Nutrition  Eat less saturated fat.  Avoid trans fats (partially hydrogenated oils). These are often found in margarine and in some baked goods, fried foods, and snacks  bought in packages.  Avoid precooked or cured meat, such as bacon, sausages, or meat loaves.  Avoid foods and drinks that have added sugars.  Eat more fruits, vegetables, and whole grains.  Choose healthy sources of protein, such as fish, poultry, lean cuts of red meat, beans, peas, lentils, and nuts.  Choose healthy sources of fat,  such as: ? Nuts. ? Vegetable oils, especially olive oil. ? Fish that have healthy fats, such as omega-3 fatty acids. These fish include mackerel or salmon.   Lifestyle  Lose weight if you are overweight. Maintaining a healthy body mass index (BMI) can help prevent or control high cholesterol. It can also lower your risk for diabetes and high blood pressure. Ask your health care provider to help you with a diet and exercise plan to lose weight safely.  Do not use any products that contain nicotine or tobacco, such as cigarettes, e-cigarettes, and chewing tobacco. If you need help quitting, ask your health care provider. Alcohol use  Do not drink alcohol if: ? Your health care provider tells you not to drink. ? You are pregnant, may be pregnant, or are planning to become pregnant.  If you drink alcohol: ? Limit how much you use to:  0-1 drink a day for women.  0-2 drinks a day for men. ? Be aware of how much alcohol is in your drink. In the [place] one drink equals one 12 oz bottle of beer (355 mL), one 5 oz glass of wine (148 mL), or one 1 oz glass of hard liquor (44 mL). Activity  Get enough exercise. Do exercises as told by your health care provider.  Each week, do at least 150 minutes of exercise that takes a medium level of effort (moderate-intensity exercise). This kind of exercise: ? Makes your heart beat faster while allowing you to still be able to talk. ? Can be done in short sessions several times a day or longer sessions a few times a week. For example, on 5 days each week, you could walk fast or ride your bike 3 times a day for 10 minutes each time.   Medicines  Your health care provider may recommend medicines to help lower cholesterol. This may be a medicine to lower the amount of cholesterol that your liver makes. You may need medicine if: ? Diet and lifestyle changes have not lowered your cholesterol enough. ? You have high cholesterol and other risk factors for heart  disease or stroke.  Take over-the-counter and prescription medicines only as told by your health care provider. General information  Manage your risk factors for high cholesterol. Talk with your health care provider about all your risk factors and how to lower your risk.  Manage other conditions that you have, such as diabetes or high blood pressure (hypertension).  Have blood tests to check your cholesterol levels at regular points in time as told by your health care provider.  Keep all follow-up visits as told by your health care provider. This is important. Where to find more information  American Heart Association: www.heart.org  National Heart, Lung, and Blood Institute: PopSteam.iswww.nhlbi.nih.gov Summary  High cholesterol increases your risk for heart disease and stroke. By keeping your cholesterol level low, you can reduce your risk for these conditions.  High cholesterol can often be prevented with diet and lifestyle changes.  Work with your health care provider to manage your risk factors, and have your blood tested regularly. This information is not intended to replace advice given  to you by your health care provider. Make sure you discuss any questions you have with your health care provider. Document Revised: 10/24/2018 Document Reviewed: 10/24/2018 Elsevier Patient Education  [place].

## 2020-03-11 ENCOUNTER — Other Ambulatory Visit: Payer: Self-pay

## 2020-03-11 ENCOUNTER — Ambulatory Visit: Payer: Medicare Other | Admitting: Cardiology

## 2020-03-11 ENCOUNTER — Encounter: Payer: Self-pay | Admitting: Cardiology

## 2020-03-11 VITALS — BP 168/96 | HR 64 | Ht 66.0 in | Wt 191.2 lb

## 2020-03-11 DIAGNOSIS — Z79899 Other long term (current) drug therapy: Secondary | ICD-10-CM

## 2020-03-11 DIAGNOSIS — R011 Cardiac murmur, unspecified: Secondary | ICD-10-CM | POA: Diagnosis not present

## 2020-03-11 DIAGNOSIS — I10 Essential (primary) hypertension: Secondary | ICD-10-CM | POA: Diagnosis not present

## 2020-03-11 DIAGNOSIS — I441 Atrioventricular block, second degree: Secondary | ICD-10-CM | POA: Diagnosis not present

## 2020-03-11 DIAGNOSIS — Z7189 Other specified counseling: Secondary | ICD-10-CM | POA: Diagnosis not present

## 2020-03-11 NOTE — Patient Instructions (Addendum)
Medication Instructions:  We will try adding 1/2 pill (25 mg) hydralazine in the middle of the day.  *If you need a refill on your cardiac medications before your next appointment, please call your pharmacy*   Lab Work: Your physician recommends lab work today (BMP).  If you have labs (blood work) drawn today and your tests are completely normal, you will receive your results only by: Marland Kitchen MyChart Message (if you have MyChart) OR . A paper copy in the mail If you have any lab test that is abnormal or we need to change your treatment, we will call you to review the results.   Testing/Procedures: None   Follow-Up: At [facility], you and your health needs are our priority.  As part of our continuing mission to provide you with exceptional heart care, we have created designated Provider Care Teams.  These Care Teams include your primary Cardiologist (physician) and Advanced Practice Providers (APPs -  Physician Assistants and Nurse Practitioners) who all work together to provide you with the care you need, when you need it.  We recommend signing up for the patient portal called "MyChart".  Sign up information is provided on this After Visit Summary.  MyChart is used to connect with patients for Virtual Visits (Telemedicine).  Patients are able to view lab/test results, encounter notes, upcoming appointments, etc.  Non-urgent messages can be sent to your provider as well.   To learn more about what you can do with MyChart, go to [email or web address].    Your next appointment:   3 month(s)  The format for your next appointment:   In Person  Provider:   Buford Dresser, MD   Your physician recommends that you schedule a follow-up appointment in 3-4 weeks with Pharm D.     Other Instructions how to check blood pressure:  -sit comfortably in a chair, feet uncrossed and flat on floor, for 5-10 minutes  -arm ideally should rest at the level of the heart. However, arm should be  relaxed and not tense (for example, do not hold the arm up unsupported)  -avoid exercise, caffeine, and tobacco for at least 30 minutes prior to BP reading  -don't take BP cuff reading over clothes (always place on skin directly)  -I prefer to know how well the medication is working, so I would like you to take your readings 1-2 hours after taking your blood pressure medication if possible  You don't have to check every day, but if you write down if it is before or after medication, that will be helpful.

## 2020-03-11 NOTE — Progress Notes (Signed)
Cardiology Office Note:    Date:  03/11/2020   ID:  Patrick Villa, DOB [birth year], MRN [id number]  PCP:  Alycia Rossetti, MD  Cardiologist:  Buford Dresser, MD PhD  Referring MD: Alycia Rossetti, MD   CC: follow up of hypertension  History of Present Illness:    Patrick Villa is a 87 y.o. male with a hx of COPD, hyperlipidemia, hypertension, second degree AV block type I who is seen in follow up today. I initially saw him as a new consult 01/2018 at the request of [place], Modena Nunnery, MD for the evaluation and management of second degree, type I heart block.   He was seen in the hospital on 01/27/18 and 01/28/18 by Drs. Fredrik Rigger for bilateral breast tenderness. At that time, troponins were negative, echo was unremarkable. Cardizem was stopped due to second degree AV block, type I.   Today: Last seen by Tommy Medal 01/28/20. At that visit, chlorthalidone was increased from a half pill to a whole pill.   Brings a log of home Bps today. These are before medications. He notes that often his BP goes up after taking the medication, though he only has one afternoon number charted  Hasn't taken medication yet this AM. Has with him, we got him water so he could take in office.   AM  PM 1/16 159/69, 86 1/22 149/74, 88 166/82, 88 2/5 159/84, 85 2/8 151/87, 87 2/10 189/93, 67 2/11 183/92, 88 2/12 161/74, 79 2/14  159/84, 85  Unchanged breast tenderness, nearly constant but sometimes will go away for a short time. We reviewed prior workup for this.  Discussed options to improve blood pressure control, including increasing hydralazine. He thinks he might be able to take 3 times/day most days. Also reviewed how to check BP.  Denies shortness of breath at rest or with normal exertion. No PND, orthopnea, LE edema or unexpected weight gain. No syncope or palpitations.  Past Medical History:  Diagnosis Date  . Colon polyps   . COPD (chronic obstructive pulmonary disease) ([place])    . Echocardiogram abnormal   . Ganglion cyst   . Hematuria   . Hyperlipidemia   . Hypertension   . Systolic murmur     Past Surgical History:  Procedure Laterality Date  . COLON SURGERY    . TRANSTHORACIC ECHOCARDIOGRAM      Current Medications: Current Outpatient Medications on File Prior to Visit  Medication Sig  . aspirin EC 81 MG tablet Take 1 tablet (81 mg total) by mouth daily.  . benazepril (LOTENSIN) 40 MG tablet Take 1 tablet (40 mg total) by mouth daily.  . chlorthalidone (HYGROTON) 25 MG tablet Take 1 tablet (25 mg total) by mouth daily.  . Cholecalciferol (VITAMIN D) 2000 UNITS CAPS Take 1 capsule (2,000 Units total) by mouth daily.  . diclofenac Sodium (VOLTAREN) 1 % GEL Apply 4 g topically 4 (four) times daily as needed.  . finasteride (PROSCAR) 5 MG tablet TAKE 1 TABLET BY MOUTH  DAILY  . hydrALAZINE (APRESOLINE) 50 MG tablet Take 1 tablet (50 mg total) by mouth in the morning and at bedtime.  Marland Kitchen terazosin (HYTRIN) 10 MG capsule TAKE 1 CAPSULE BY MOUTH  DAILY AT BEDTIME  . triamcinolone cream (KENALOG) 0.1 % Apply 1 application topically 2 (two) times daily.   No current facility-administered medications on file prior to visit.     Allergies:   Amlodipine, Lipitor [atorvastatin calcium], Zocor [simvastatin], Bactrim [sulfamethoxazole-trimethoprim], and Pravachol  Social History   Tobacco Use  . Smoking status: Former Smoker    Quit date: 10/18/1981    Years since quitting: 38.4  . Smokeless tobacco: Never Used  Substance Use Topics  . Alcohol use: No  . Drug use: No    Family History: The patient's family history includes Diabetes in his mother; Stroke in his brother.  ROS:   Please see the history of present illness.  Additional pertinent ROS negative except as documented.    EKGs/Labs/Other Studies Reviewed:    The following studies were reviewed today: Echo 01/28/18 - Left ventricle: The cavity size was normal. Wall thickness was   normal.  Systolic function was normal. The estimated ejection   fraction was in the range of 55% to 60%. Wall motion was normal;   there were no regional wall motion abnormalities. The study is   not technically sufficient to allow evaluation of LV diastolic   function. - Atrial septum: There was an atrial septal aneurysm.  Impressions:  - Normal LV function; sclerotic aortic valve.  EKG:  EKG is personally reviewed.  The ekg ordered today demonstrates sinus rhythm, 2nd degree AV block type 1, LAFB, PRWP. Compared to prior ECG, there is now nonspecific ST-T pattern in V3-V6  Recent Labs: 07/23/2019: ALT 19; BUN 15; Creat 1.20; Hemoglobin 12.5; Platelets 226; Potassium 3.9; Sodium 140  Recent Lipid Panel    Component Value Date/Time   CHOL 178 03/02/2019 0938   TRIG 53 03/02/2019 0938   HDL 43 03/02/2019 0938   CHOLHDL 4.1 03/02/2019 0938   VLDL 18 01/28/2018 0538   LDLCALC 121 (H) 03/02/2019 0938    Physical Exam:    VS:  BP (!) 168/96   Pulse 64   Ht 5\' 6"  (1.676 m)   Wt 191 lb 3.2 oz (86.7 kg)   SpO2 99%   BMI 30.86 kg/m     Wt Readings from Last 3 Encounters:  03/11/20 191 lb 3.2 oz (86.7 kg)  01/28/20 190 lb 3.2 oz (86.3 kg)  12/10/19 191 lb (86.6 kg)    GEN: Well nourished, well developed in no acute distress HEENT: Normal, moist mucous membranes NECK: No JVD CARDIAC: regular rhythm, normal S1 and S2, no rubs or gallops. 1/6 systolic murmur. VASCULAR: Radial and DP pulses 2+ bilaterally. No carotid bruits RESPIRATORY:  Clear to auscultation without rales, wheezing or rhonchi  ABDOMEN: Soft, non-tender, non-distended MUSCULOSKELETAL:  Ambulates independently SKIN: Warm and dry, no edema NEUROLOGIC:  Alert and oriented x 3. No focal neuro deficits noted. PSYCHIATRIC:  Normal affect   ASSESSMENT:    1. Uncontrolled hypertension   2. Systolic murmur   3. Second degree AV block, Mobitz type I   4. Medication management   5. Cardiac risk counseling   6. Counseling  on health promotion and disease prevention    PLAN:    Hypertension, has not been well controlled. We have discussed extensively -remains above goal.  -this has been a long term process, as there are medications he has not tolerated and some that do not appear to affect his blood pressure -after discussion, will add 25 mg hydralazine in the middle of the day, so will be 50 mg AM/25 mg mid day/50 mg PM.  -discussed that it would be helpful to have BP numbers several hours after he takes medication to make sure it is working. -imdur not on medication list today, not one of the pills he has with him today. Unclear when/why this was  stopped, but did not appear to improve blood pressure significantly -due for BMET today due to increased chlorthalidone dose at last PharmD visit  History of antihypertensives -tried amlodipine, had jitteriness/tremors. Had chest wall pain on spironolactone.  -no diltiazem/verapamil, beta blocker due to second degree AV block type 1 ([place]) -had aches/balance issues on chlorthalidone, but now tolerating -had worsening chest wall pain on >50 mg hydralazine  Aspirin use: reviewed indications/recommendations, but he wishes to continue.  Murmur: aortic sclerosis without stenosis, no further workup needed. Echo 01/2018  History of chest discomfort: longstanding, atypical. Lexiscan normal. Mammogram normal. Likely chest wall tenderness.  Second degree AV block, type I: seen again on ECG today  CV risk counseling and prevention recommendations -recommend heart healthy/Mediterranean diet, with whole grains, fruits, vegetable, fish, lean meats, nuts, and olive oil. Limit salt. -recommend moderate walking, 3-5 times/week for 30-50 minutes each session. Aim for at least 150 minutes.week. Goal should be pace of 3 miles/hours, or walking 1.5 miles in 30 minutes -recommend avoidance of tobacco products. Avoid excess alcohol. -ASCVD risk score: The ASCVD Risk score Mikey Bussing  DC Jr., et al., 2013) failed to calculate for the following reasons:   The 2013 ASCVD risk score is only valid for ages 65 to 55   Plan for follow up: ~3-4 weeks with Pharm D clinic, 3 mos with me  Buford Dresser, MD, PhD [address]  [facility]   Medication Adjustments/Labs and Tests Ordered: Current medicines are reviewed at length with the patient today.  Concerns regarding medicines are outlined above.  Orders Placed This Encounter  Procedures  . EKG 12-Lead   No orders of the defined types were placed in this encounter.   Patient Instructions  Medication Instructions:  We will try adding 1/2 pill (25 mg) hydralazine in the middle of the day.  *If you need a refill on your cardiac medications before your next appointment, please call your pharmacy*   Lab Work: Your physician recommends lab work today (BMP).  If you have labs (blood work) drawn today and your tests are completely normal, you will receive your results only by: Marland Kitchen MyChart Message (if you have MyChart) OR . A paper copy in the mail If you have any lab test that is abnormal or we need to change your treatment, we will call you to review the results.   Testing/Procedures: None   Follow-Up: At [facility], you and your health needs are our priority.  As part of our continuing mission to provide you with exceptional heart care, we have created designated Provider Care Teams.  These Care Teams include your primary Cardiologist (physician) and Advanced Practice Providers (APPs -  Physician Assistants and Nurse Practitioners) who all work together to provide you with the care you need, when you need it.  We recommend signing up for the patient portal called "MyChart".  Sign up information is provided on this After Visit Summary.  MyChart is used to connect with patients for Virtual Visits (Telemedicine).  Patients are able to view lab/test results, encounter notes, upcoming appointments, etc.   Non-urgent messages can be sent to your provider as well.   To learn more about what you can do with MyChart, go to [email or web address].    Your next appointment:   3 month(s)  The format for your next appointment:   In Person  Provider:   Buford Dresser, MD   Your physician recommends that you schedule a follow-up appointment in 3-4 weeks with Pharm D.  Other Instructions how to check blood pressure:  -sit comfortably in a chair, feet uncrossed and flat on floor, for 5-10 minutes  -arm ideally should rest at the level of the heart. However, arm should be relaxed and not tense (for example, do not hold the arm up unsupported)  -avoid exercise, caffeine, and tobacco for at least 30 minutes prior to BP reading  -don't take BP cuff reading over clothes (always place on skin directly)  -I prefer to know how well the medication is working, so I would like you to take your readings 1-2 hours after taking your blood pressure medication if possible  You don't have to check every day, but if you write down if it is before or after medication, that will be helpful.        Signed, Buford Dresser, MD PhD 03/11/2020    [place]

## 2020-03-12 LAB — BASIC METABOLIC PANEL
BUN/Creatinine Ratio: 20 (ref 10–24)
BUN: 21 mg/dL (ref 8–27)
CO2: 26 mmol/L (ref 20–29)
Calcium: 10.3 mg/dL — ABNORMAL HIGH (ref 8.6–10.2)
Chloride: 100 mmol/L (ref 96–106)
Creatinine, Ser: 1.05 mg/dL (ref 0.76–1.27)
GFR calc Af Amer: 75 mL/min/{1.73_m2} (ref >59)
GFR calc non Af Amer: 65 mL/min/{1.73_m2} (ref >59)
Glucose: 84 mg/dL (ref 65–99)
Potassium: 4.3 mmol/L (ref 3.5–5.2)
Sodium: 143 mmol/L (ref 134–144)

## 2020-03-16 ENCOUNTER — Encounter: Payer: Self-pay | Admitting: Cardiology

## 2020-03-18 ENCOUNTER — Other Ambulatory Visit: Payer: Self-pay | Admitting: Family Medicine

## 2020-03-18 ENCOUNTER — Telehealth: Payer: Self-pay | Admitting: Family Medicine

## 2020-03-18 NOTE — Telephone Encounter (Signed)
Would like to see if Dr.Pickard will take him on has a patient

## 2020-03-19 NOTE — Telephone Encounter (Signed)
Call placed to patient. LMTRC.  

## 2020-03-19 NOTE — Telephone Encounter (Signed)
He can see Janett Billow He has cardiology already managing heart issus and previous patient of Olean Ree

## 2020-03-20 NOTE — Telephone Encounter (Signed)
Call placed to patient and patient made aware.  

## 2020-04-01 ENCOUNTER — Other Ambulatory Visit: Payer: Self-pay

## 2020-04-01 ENCOUNTER — Ambulatory Visit (INDEPENDENT_AMBULATORY_CARE_PROVIDER_SITE_OTHER): Payer: Medicare Other | Admitting: Pharmacist Clinician (PhC)/ Clinical Pharmacy Specialist

## 2020-04-01 DIAGNOSIS — I10 Essential (primary) hypertension: Secondary | ICD-10-CM

## 2020-04-01 MED ORDER — HYDRALAZINE HCL 50 MG PO TABS: 50.0000 mg | ORAL_TABLET | Freq: Three times a day (TID) | ORAL | 3 refills | Status: DC

## 2020-04-01 NOTE — Patient Instructions (Signed)
Return for a a follow up appointment with Dr. Harrell Gave in May, we can see you 2-3 months after that as needed  Check your blood pressure at home 3-4 days each week and keep record of the readings.  Take your BP meds as follows:  Take hydralazine three times daily.  The middle dose can be anywhere between noon and 5-6 pm.   Continue with all other medications  Bring all of your meds, your BP cuff and your record of home blood pressures to your next appointment.  Exercise as you're able, try to walk approximately 30 minutes per day.  Keep salt intake to a minimum, especially watch canned and prepared boxed foods.  Eat more fresh fruits and vegetables and fewer canned items.  Avoid eating in fast food restaurants.    HOW TO TAKE YOUR BLOOD PRESSURE: . Rest 5 minutes before taking your blood pressure. .  Don't smoke or drink caffeinated beverages for at least 30 minutes before. . Take your blood pressure before (not after) you eat. . Sit comfortably with your back supported and both feet on the floor (don't cross your legs). . Elevate your arm to heart level on a table or a desk. . Use the proper sized cuff. It should fit smoothly and snugly around your bare upper arm. There should be enough room to slip a fingertip under the cuff. The bottom edge of the cuff should be 1 inch above the crease of the elbow. . Ideally, take 3 measurements at one sitting and record the average.

## 2020-04-01 NOTE — Assessment & Plan Note (Signed)
Patient with hard to treat hypertension, still not to goal.  We discussed his trying to take hydralazine three times daily and suggest that he can take mid day dose anytime from lunch to dinner.  He will work on being better compliant about that.  He will also watch sodium intake more closely.  He is scheduled to see Dr. Harrell Gave in May, and we would be happy to see him a month or two after that if he is still having problems controlling his pressure.

## 2020-04-01 NOTE — Progress Notes (Signed)
04/01/2020 Patrick Villa [birth year] [id number]   HPI:  Patrick Villa is a 87 y.o. male patient of Dr Harrell Gave, with a [place] below who presents today for hypertension clinic evaluation.  In addition to hypertension his medical history is significant for COPD, hyperlipidemia, and second degree AV block (type 1).  He was hospitalized overnight in January 2020 for chest pain, which was when the AV block was noted.  He had been on diltiazem for his blood pressure, but that was discontinued on discharge.  He most recently saw Dr. Harrell Gave in November, at which time his pressure was noted to be 178/84.  He has had sensitivities to multiple medications in the past (see below), making it more challenging to find the right combination that will bring his pressure down.  He rarely has home BP readings with him, but notes that it's usually 937-169'C systolic.  He saw Dr. Harrell Gave in February and she asked that he increase the hydralazine to three times daily.    Today he returns for follow up.   He states feeling well overall, although admits that he can't remember to take a mid-day hydralazine dose more than just a couple of days each week.  States blood pressure has been running in the same 140-160 range, although one day was 200/87 with a heart rate 90.  In discussing his activities the day prior to that he admitted to eating a large amount of soup.    Blood Pressure Goal:  130/80  Current Medications:  Benazepril 40 mg (am)  Hydralazine 50 mg bid-tid  Terazosin 10 mg qhs  Chlorthalidone 12.5 mg (pm)  Family Hx:  Mother died at 13, "heart disease", hypertension, DM  Father died at 24  3 siblings, 1 sister with heart disease, 1 brother deceased (asthma)  1 daughter no health issues  Social Hx:  No tobacco, quit 40 years ago; occasional social alcohol; coffee occasionally, pepsi or mt dew couple of times per week.  Diet:  Mostly home cooked, occasional fried foods; mostly fresh or frozen  veggies; nuts and popcorn; salsa and chips;  Fried fish most fridays;   Exercise:  Always up and moving, takes care of yard, tinkers in garage all day, doesn't sit still much  Home BP readings:  Fluctuates a lot 127-158, nothing over 789; diastolic 38-10'F (AM), evenings tend to trend a little lower but not significantly  Intolerances:  CCB/BB - cannot take due to AV block  Spironolactone - avoid due to chest wall pain  Chlorthalidone - aches and balance issues (? - has been taking 12.5 mg)  Amlodipine - jitteriness, tremors, unstable gait, AV block  Hydralazine in doses > 50 mg increases chest wall pain   Labs:  07/23/2019: Na 140, K 3.9, Glu 147, BUN 15, SCr 1.20  01/27/2018:  Na 140, K 3.7, Glu 117, BUN 12, SCr 1.08, GFR > 60  Wt Readings from Last 3 Encounters:  04/01/20 180 lb (81.6 kg)  03/11/20 191 lb 3.2 oz (86.7 kg)  01/28/20 190 lb 3.2 oz (86.3 kg)   BP Readings from Last 3 Encounters:  04/01/20 (!) 152/68  03/11/20 (!) 168/96  01/28/20 (!) 144/76   Pulse Readings from Last 3 Encounters:  04/01/20 87  03/11/20 64  01/28/20 70    Current Outpatient Medications  Medication Sig Dispense Refill  . aspirin EC 81 MG tablet Take 1 tablet (81 mg total) by mouth daily. 30 tablet 11  . benazepril (LOTENSIN) 40 MG  tablet Take 1 tablet (40 mg total) by mouth daily. 90 tablet 3  . chlorthalidone (HYGROTON) 25 MG tablet Take 1 tablet (25 mg total) by mouth daily. 90 tablet 3  . Cholecalciferol (VITAMIN D) 2000 UNITS CAPS Take 1 capsule (2,000 Units total) by mouth daily. 30 capsule 11  . diclofenac Sodium (VOLTAREN) 1 % GEL Apply 4 g topically 4 (four) times daily as needed. 500 g 6  . finasteride (PROSCAR) 5 MG tablet TAKE 1 TABLET BY MOUTH  DAILY 90 tablet 3  . hydrALAZINE (APRESOLINE) 50 MG tablet Take 1 tablet (50 mg total) by mouth 3 (three) times daily. 270 tablet 3  . terazosin (HYTRIN) 10 MG capsule TAKE 1 CAPSULE BY MOUTH  DAILY AT BEDTIME 90 capsule 3  . triamcinolone  cream (KENALOG) 0.1 % Apply 1 application topically 2 (two) times daily. 30 g 1   No current facility-administered medications for this visit.    Allergies  Allergen Reactions  . Amlodipine Other (See Comments)    Patient developed jitteriness, tremors and unstable gait.  Same issues when re-challenged  . Lipitor [Atorvastatin Calcium]     Weakness.  . Zocor [Simvastatin]   . Bactrim [Sulfamethoxazole-Trimethoprim] Rash  . Pravachol Hives and Rash    Past Medical History:  Diagnosis Date  . Colon polyps   . COPD (chronic obstructive pulmonary disease) ([place])   . Echocardiogram abnormal   . Ganglion cyst   . Hematuria   . Hyperlipidemia   . Hypertension   . Systolic murmur     Blood pressure (!) 152/68, pulse 87, resp. rate 16, height 5\' 6"  (1.676 m), weight 180 lb (81.6 kg), SpO2 98 %.  Uncontrolled hypertension Patient with hard to treat hypertension, still not to goal.  We discussed his trying to take hydralazine three times daily and suggest that he can take mid day dose anytime from lunch to dinner.  He will work on being better compliant about that.  He will also watch sodium intake more closely.  He is scheduled to see Dr. Harrell Gave in May, and we would be happy to see him a month or two after that if he is still having problems controlling his pressure.     Tommy Medal PharmD CPP [facility] [address] [place], [place] [address] [phone]

## 2020-04-07 ENCOUNTER — Telehealth: Payer: Self-pay | Admitting: Pharmacist

## 2020-04-07 NOTE — Progress Notes (Addendum)
Chronic Care Management Pharmacy Assistant   Name: Patrick Villa  MRN: [id number] DOB: 07/28/1933  Reason for Encounter: Disease State For HTN.   Conditions to be addressed/monitored: hypertension, COPD, hyperlipidemia, vitamin D Deficiency.  Recent office visits: None since 02/27/20  Recent consult visits:  04/01/20 Cardiology Tommy Medal L, RPH-CPP. For uncontrolled hypertension. CHANGED Hydralazine to 50 mg three times a day. 03/11/20 Cardiology Buford Dresser, MD. For uncontrolled hypertension. ADDED 1/2 pill of 25 mg Hydralazine in the middle of the day.  Hospital visits:  None in previous 6 months  Medications: Outpatient Encounter Medications as of 04/07/2020  Medication Sig   aspirin EC 81 MG tablet Take 1 tablet (81 mg total) by mouth daily.   benazepril (LOTENSIN) 40 MG tablet Take 1 tablet (40 mg total) by mouth daily.   chlorthalidone (HYGROTON) 25 MG tablet Take 1 tablet (25 mg total) by mouth daily.   Cholecalciferol (VITAMIN D) 2000 UNITS CAPS Take 1 capsule (2,000 Units total) by mouth daily.   diclofenac Sodium (VOLTAREN) 1 % GEL Apply 4 g topically 4 (four) times daily as needed.   finasteride (PROSCAR) 5 MG tablet TAKE 1 TABLET BY MOUTH  DAILY   hydrALAZINE (APRESOLINE) 50 MG tablet Take 1 tablet (50 mg total) by mouth 3 (three) times daily.   terazosin (HYTRIN) 10 MG capsule TAKE 1 CAPSULE BY MOUTH  DAILY AT BEDTIME   triamcinolone cream (KENALOG) 0.1 % Apply 1 application topically 2 (two) times daily.   No facility-administered encounter medications on file as of 04/07/2020.   Reviewed chart prior to disease state call. Spoke with patient regarding BP  Recent Office Vitals: BP Readings from Last 3 Encounters:  04/01/20 (!) 152/68  03/11/20 (!) 168/96  01/28/20 (!) 144/76   Pulse Readings from Last 3 Encounters:  04/01/20 87  03/11/20 64  01/28/20 70    Wt Readings from Last 3 Encounters:  04/01/20 180 lb (81.6 kg)  03/11/20 191 lb 3.2  oz (86.7 kg)  01/28/20 190 lb 3.2 oz (86.3 kg)     Kidney Function Lab Results  Component Value Date/Time   CREATININE 1.05 03/11/2020 10:50 AM   CREATININE 1.20 (H) 07/23/2019 10:59 AM   CREATININE 1.21 (H) 03/02/2019 09:38 AM   GFRNONAA 65 03/11/2020 10:50 AM   GFRNONAA 57 (L) 11/02/2017 12:55 PM   GFRAA 75 03/11/2020 10:50 AM   GFRAA 66 11/02/2017 12:55 PM    BMP Latest Ref Rng & Units 03/11/2020 07/23/2019 03/02/2019  Glucose 65 - 99 mg/dL 84 147(H) 97  BUN 8 - 27 mg/dL 21 15 19   Creatinine 0.76 - 1.27 mg/dL 1.05 1.20(H) 1.21(H)  BUN/Creat Ratio 10 - 24 20 13 16   Sodium 134 - 144 mmol/L 143 140 141  Potassium 3.5 - 5.2 mmol/L 4.3 3.9 3.9  Chloride 96 - 106 mmol/L 100 105 103  CO2 20 - 29 mmol/L 26 28 25   Calcium 8.6 - 10.2 mg/dL 10.3(H) 9.9 10.2    Current antihypertensive regimen:  Benazepril 40 mg Hydralazine 50 mg three daily Terazosin 10 mg hs  How often are you checking your Blood Pressure?Patient stated about 3-5x per week   Current home BP readings: Patient stated his blood pressure his blood pressure is still uncontrolled he stated sometimes its  Very low and sometimes it in the higher range. I suggested to continue to write them down and check his blood pressure more often.   04/06/20 94/68 145 pm 04/07/20 147/78 AM  What recent  interventions/DTPs have been made by any provider to improve Blood Pressure control since last CPP Visit: None.  Any recent hospitalizations or ED visits since last visit with CPP? Patient stated no.   What diet changes have been made to improve Blood Pressure Control?  Patient stated he eats fish, pork chops, chicken. He stated he eats vegetables and fruit. He stated he cooks all of his meat in the [employer]. Patient state he drinks mostly water sometimes he drinks some a soda.  What exercise is being done to improve your Blood Pressure Control?  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   Adherence Review: Is the patient currently on ACE/ARB medication? Benazepril 40 mg daily.  Does the patient have >5 day gap between last estimated fill dates? Per misc rpts, No.  Star Rating Drugs: Benazepril 40 mg daily 03/14/20 90 DS.  Patient stated he does not have any questions or concerns about his medication at this time.   Follow-Up: Pharmacist Review  Charlann Lange, RMA Clinical Pharmacist Assistant [phone]  10 minutes spent in review, coordination, and documentation.  Reviewed by: Beverly Milch, PharmD Clinical Pharmacist [facility] [phone]

## 2020-04-17 ENCOUNTER — Other Ambulatory Visit: Payer: Self-pay

## 2020-04-17 ENCOUNTER — Ambulatory Visit (INDEPENDENT_AMBULATORY_CARE_PROVIDER_SITE_OTHER): Payer: Medicare Other | Admitting: Nurse Practitioner

## 2020-04-17 ENCOUNTER — Encounter: Payer: Self-pay | Admitting: Nurse Practitioner

## 2020-04-17 VITALS — BP 142/74 | HR 80 | Temp 98.0°F | Resp 14 | Ht 66.0 in | Wt 193.0 lb

## 2020-04-17 DIAGNOSIS — M79671 Pain in right foot: Secondary | ICD-10-CM | POA: Insufficient documentation

## 2020-04-17 DIAGNOSIS — M79672 Pain in left foot: Secondary | ICD-10-CM | POA: Diagnosis not present

## 2020-04-17 MED ORDER — PREDNISONE 20 MG PO TABS: 40.0000 mg | ORAL_TABLET | Freq: Every day | ORAL | 0 refills | Status: AC

## 2020-04-17 NOTE — Patient Instructions (Signed)
F/u 1 month for wellness

## 2020-04-17 NOTE — Progress Notes (Signed)
Subjective:    Patient ID: Patrick Villa, male    DOB: Jul 07, 1933, 87 y.o.   MRN: [id number]  HPI: Patrick Villa is a 87 y.o. male presenting for pain in the bottom of both feet radiating up to his hips bilaterally and across his low back.  Chief Complaint  Patient presents with  . R Hip Pain    X1 week- R hip pain that radiates down leg to foot arch   FOOT PAIN Duration: weeks Involved foot: bilateral Mechanism of injury: unknown Location: balls of both feet Onset: gradually  Severity: Moderate to esvere Quality: sharp Frequency: intermittent Radiation: yes; up to hips bilaterally Aggravating factors: moving about  Alleviating factors: sitting down  Status: stable Treatments attempted: rest  Relief with NSAIDs?:   Weakness with weight bearing or walking: no Morning stiffness: no Swelling: no Redness: no Bruising: no Paresthesias / decreased sensation: no  Fevers:no  Of note, has a history of back pain - MRI In 2017 showed spinal stenosis at L3-4 with foraminal encroachment bilaterally L5-S1 due to disc bulging and facet hypertrophy.  Saw Dr. Kirt Boys at [facility] in the past and was treated conservatively - no surgery was indicated at that time.  Allergies  Allergen Reactions  . Amlodipine Other (See Comments)    Patient developed jitteriness, tremors and unstable gait.  Same issues when re-challenged  . Lipitor [Atorvastatin Calcium]     Weakness.  . Zocor [Simvastatin]   . Bactrim [Sulfamethoxazole-Trimethoprim] Rash  . Pravachol Hives and Rash    Outpatient Encounter Medications as of 04/17/2020  Medication Sig  . aspirin EC 81 MG tablet Take 1 tablet (81 mg total) by mouth daily.  . benazepril (LOTENSIN) 40 MG tablet Take 1 tablet (40 mg total) by mouth daily.  . chlorthalidone (HYGROTON) 25 MG tablet Take 1 tablet (25 mg total) by mouth daily.  . Cholecalciferol (VITAMIN D) 2000 UNITS CAPS Take 1 capsule (2,000 Units total) by mouth  daily.  . diclofenac Sodium (VOLTAREN) 1 % GEL Apply 4 g topically 4 (four) times daily as needed.  . finasteride (PROSCAR) 5 MG tablet TAKE 1 TABLET BY MOUTH  DAILY  . hydrALAZINE (APRESOLINE) 50 MG tablet Take 1 tablet (50 mg total) by mouth 3 (three) times daily.  . predniSONE (DELTASONE) 20 MG tablet Take 2 tablets (40 mg total) by mouth daily with breakfast for 5 days.  Marland Kitchen terazosin (HYTRIN) 10 MG capsule TAKE 1 CAPSULE BY MOUTH  DAILY AT BEDTIME  . triamcinolone cream (KENALOG) 0.1 % Apply 1 application topically 2 (two) times daily.   No facility-administered encounter medications on file as of 04/17/2020.    Patient Active Problem List   Diagnosis Date Noted  . Bilateral foot pain 04/17/2020  . ED (erectile dysfunction) 03/27/2019  . BPH (benign prostatic hyperplasia) 05/23/2018  . 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 Vitamin D deficiency 07/17/2014  . Uncontrolled hypertension   . COPD (chronic obstructive pulmonary disease) ([place])   . Hyperlipidemia   . Colon polyps   . Systolic murmur   . Ganglion cyst     Past Medical History:  Diagnosis Date  . Colon polyps   . COPD (chronic obstructive pulmonary disease) ([place])   . Echocardiogram abnormal   . Ganglion cyst   . Hematuria   . Hyperlipidemia   . Hypertension   . Systolic murmur     Relevant past medical, surgical, family and social history reviewed and  updated as indicated. Interim medical history since our last visit reviewed.  Review of Systems Per HPI unless specifically indicated above     Objective:    BP (!) 142/74   Pulse 80   Temp 98 F (36.7 C) (Temporal)   Resp 14   Ht 5\' 6"  (1.676 m)   Wt 193 lb (87.5 kg)   SpO2 98%   BMI 31.15 kg/m   Wt Readings from Last 3 Encounters:  04/17/20 193 lb (87.5 kg)  04/01/20 180 lb (81.6 kg)  03/11/20 191 lb 3.2 oz (86.7 kg)    Physical Exam Vitals and nursing note reviewed.  Constitutional:      General: He is not in acute distress.     Appearance: Normal appearance. He is not toxic-appearing.  HENT:     Head: Normocephalic and atraumatic.  Eyes:     General: No scleral icterus.    Extraocular Movements: Extraocular movements intact.  Musculoskeletal:        General: Normal range of motion.     Lumbar back: Tenderness present. Negative right straight leg raise test and negative left straight leg raise test.       Back:     Right lower leg: No swelling. No edema.     Left lower leg: No swelling. No edema.     Right ankle: Normal pulse.     Left ankle: Normal pulse.     Right foot: Normal. Normal range of motion and normal capillary refill. No swelling or bony tenderness. Normal pulse.     Left foot: Normal. Normal range of motion and normal capillary refill. No swelling or bony tenderness. Normal pulse.     Comments: Patient tender to palpation in the area marked above  Skin:    General: Skin is warm and dry.     Capillary Refill: Capillary refill takes less than 2 seconds.     Coloration: Skin is not jaundiced or pale.     Findings: No erythema.  Neurological:     Mental Status: He is alert and oriented to person, place, and time.     Motor: No weakness.     Gait: Gait normal.  Psychiatric:        Mood and Affect: Mood normal.        Behavior: Behavior normal.        Thought Content: Thought content normal.        Judgment: Judgment normal.        Assessment & Plan:   Problem List Items Addressed This Visit      Other   Bilateral foot pain - Primary    Acute.  Unclear etiology.  No red flags in history or on examination today.  No history of diabetes and monofilament testing intact bilaterally.  Patient has excellent circulation to both lower extremities-no pain with deep palpation to plantar aspect of feet.  Some concern for sciatica given pain is traveling up the leg into the low back, however patient reports the pain starts in his feet.  Will start on burst of prednisone foot stretches/exercises, and  applying Voltaren gel to the bottoms of his feet.  Will place referral to physical therapy in case pain does not improve.  Also consider imaging if pain does not improve at this time.      Relevant Medications   predniSONE (DELTASONE) 20 MG tablet   Other Relevant Orders   Ambulatory referral to Physical Therapy       Follow up  plan: Return in about 4 weeks (around 05/15/2020) for AWV.

## 2020-04-17 NOTE — Assessment & Plan Note (Addendum)
Acute.  Unclear etiology.  No red flags in history or on examination today.  No history of diabetes and monofilament testing intact bilaterally.  Patient has excellent circulation to both lower extremities-no pain with deep palpation to plantar aspect of feet.  Some concern for sciatica given history of and that pain is traveling up the leg into the low back, however patient reports the pain starts in his feet.  Will start on burst of prednisone foot stretches/exercises, and applying Voltaren gel to the bottoms of his feet.  Will place referral to physical therapy in case pain does not improve.  Also consider imaging vs. Referral back to neurosurgery if pain does not improve.

## 2020-04-30 ENCOUNTER — Other Ambulatory Visit: Payer: Self-pay

## 2020-04-30 ENCOUNTER — Encounter: Payer: Self-pay | Admitting: Nurse Practitioner

## 2020-04-30 ENCOUNTER — Ambulatory Visit (INDEPENDENT_AMBULATORY_CARE_PROVIDER_SITE_OTHER): Payer: Medicare Other | Admitting: Nurse Practitioner

## 2020-04-30 VITALS — BP 140/60 | HR 74 | Temp 97.3°F | Ht 66.0 in | Wt 194.0 lb

## 2020-04-30 DIAGNOSIS — I10 Essential (primary) hypertension: Secondary | ICD-10-CM

## 2020-04-30 DIAGNOSIS — R7309 Other abnormal glucose: Secondary | ICD-10-CM | POA: Diagnosis not present

## 2020-04-30 DIAGNOSIS — Z0001 Encounter for general adult medical examination with abnormal findings: Secondary | ICD-10-CM

## 2020-04-30 DIAGNOSIS — E7800 Pure hypercholesterolemia, unspecified: Secondary | ICD-10-CM

## 2020-04-30 DIAGNOSIS — K219 Gastro-esophageal reflux disease without esophagitis: Secondary | ICD-10-CM

## 2020-04-30 MED ORDER — OMEPRAZOLE 20 MG PO CPDR: 20.0000 mg | DELAYED_RELEASE_CAPSULE | Freq: Every day | ORAL | 0 refills | Status: DC

## 2020-04-30 NOTE — Progress Notes (Signed)
Patient: Patrick Villa, Male    DOB: September 13, 1933, 87 y.o.   MRN: [id number]  Visit Date: 04/30/2020  Today's Provider: Eulogio Bear, NP  Cardiologist: Dr. Shawna Orleans Orthopedist: Dr. Junius Roads Urologist: Dr. Lovena Neighbours  Chief Complaint  Patient presents with  . Medicare Wellness    Subjective:   Patrick Villa is a 87 y.o. male who presents today for his Subsequent Annual Wellness Visit.  HPI  Doing well overall.  Questions about indigestion, worse at night time.  Thinks he is eating too much.  GERD GERD control status: uncontrolled Satisfied with current treatment? no Heartburn frequency: multiple times per week Medication side effects: n/a  Medication compliance: n/a Previous GERD medications: Tums, Pepto Bismol  Antacid use frequency:  rarely Duration: burning Nature: burning Location: epigastrium and across  Heartburn duration:  Alleviatiating factors:   Aggravating factors:  Dysphagia: no Odynophagia:  no Hematemesis: no Blood in stool: no EGD: yes - probably 20 years ago  Also with questions about excessive tearing.   Review of Systems  Constitutional: Negative.  Negative for activity change, chills, fatigue and fever.  Eyes: Positive for discharge (watery eyes).  Gastrointestinal: Positive for abdominal pain. Negative for anal bleeding, blood in stool, constipation, diarrhea, nausea and vomiting.  Skin: Negative.   Neurological: Negative.   Psychiatric/Behavioral: Negative.    Past Medical History:  Diagnosis Date  . Colon polyps   . COPD (chronic obstructive pulmonary disease) ([place])   . Echocardiogram abnormal   . Ganglion cyst   . Hematuria   . Hyperlipidemia   . Hypertension   . Systolic murmur     Past Surgical History:  Procedure Laterality Date  . COLON SURGERY    . TRANSTHORACIC ECHOCARDIOGRAM      Family History  Problem Relation Age of Onset  . Diabetes Mother   . Stroke Brother     Social History   Socioeconomic History  . Marital  status: Widowed    Spouse name: Not on file  . Number of children: Not on file  . Years of education: Not on file  . Highest education level: Not on file  Occupational History  . Not on file  Tobacco Use  . Smoking status: Former Smoker    Quit date: 10/18/1981    Years since quitting: 38.5  . Smokeless tobacco: Never Used  Substance and Sexual Activity  . Alcohol use: No  . Drug use: No  . Sexual activity: Not on file  Other Topics Concern  . 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   No other exercise   Plays guitar.   Separated. Lives alone.   Social Determinants of Health   Financial Resource Strain: Low Risk   . Difficulty of Paying Living Expenses: Not very hard  Food Insecurity: Not on file  Transportation Needs: Not on file  Physical Activity: Not on file  Stress: Not on file  Social Connections: Not on file  Intimate Partner Violence: Not on file    Outpatient Encounter Medications as of 04/30/2020  Medication Sig  . omeprazole (PRILOSEC) 20 MG capsule Take 1 capsule (20 mg total) by mouth daily.  Marland Kitchen aspirin EC 81 MG tablet Take 1 tablet (81 mg total) by mouth daily.  . benazepril (LOTENSIN) 40 MG tablet Take 1 tablet (40 mg total) by mouth daily.  . chlorthalidone (HYGROTON) 25 MG tablet Take 1 tablet (25 mg total) by mouth daily.  Marland Kitchen  Cholecalciferol (VITAMIN D) 2000 UNITS CAPS Take 1 capsule (2,000 Units total) by mouth daily.  . diclofenac Sodium (VOLTAREN) 1 % GEL Apply 4 g topically 4 (four) times daily as needed.  . finasteride (PROSCAR) 5 MG tablet TAKE 1 TABLET BY MOUTH  DAILY  . hydrALAZINE (APRESOLINE) 50 MG tablet Take 1 tablet (50 mg total) by mouth 3 (three) times daily.  Marland Kitchen terazosin (HYTRIN) 10 MG capsule TAKE 1 CAPSULE BY MOUTH  DAILY AT BEDTIME  . triamcinolone cream (KENALOG) 0.1 % Apply 1 application topically 2 (two) times daily.   No facility-administered encounter medications on file as  of 04/30/2020.   Lives alone, daughter lives down the street, has a "friend" who he sees every day, but they do not live together.  Functional Ability / Safety Screening 1.  Was the timed Get Up and Go test longer than 30 seconds?  no 2.  Does the patient need help with the phone, transportation, shopping,      preparing meals, housework, laundry, medications, or managing money?  yes 3.  Does the patient's home have:  loose throw rugs in the hallway?   no      Grab bars in the bathroom? yes      Handrails on the stairs?   n/a does not steps      Poor lighting?   no 4.  Has the patient noticed any hearing difficulties?   no  Fall Risk Assessment See under rooming  Depression Screen See under rooming Depression screen [facility] 2/9 02/26/2019 08/25/2018 02/07/2018 11/25/2016 11/01/2016  Decreased Interest 0 0 0 0 0  Down, Depressed, Hopeless 0 0 0 0 0  PHQ - 2 Score 0 0 0 0 0  Altered sleeping - - - - -  Tired, decreased energy - - - - -  Change in appetite - - - - -  Feeling bad or failure about yourself  - - - - -  Trouble concentrating - - - - -  Moving slowly or fidgety/restless - - - - -  Suicidal thoughts - - - - -  PHQ-9 Score - - - - -  Difficult doing work/chores - - - - -    Advanced Directives Does patient have a HCPOA?    yes If yes, name and contact information: daughter - Adrian Saran Does patient have a living will or MOST form?  yes  Objective:   Vitals: BP 140/60   Pulse 74   Temp (!) 97.3 F (36.3 C)   Wt 194 lb (88 kg)   SpO2 98%   BMI 31.31 kg/m  Body mass index is 31.31 kg/m. No exam data present  Physical Exam Vitals and nursing note reviewed.  Constitutional:      General: He is not in acute distress.    Appearance: Normal appearance. He is not toxic-appearing.  Abdominal:     General: Abdomen is flat. Bowel sounds are normal. There is no distension.     Palpations: Abdomen is soft.     Tenderness: There is no right CVA tenderness or left CVA  tenderness.  Skin:    General: Skin is warm and dry.     Capillary Refill: Capillary refill takes less than 2 seconds.     Coloration: Skin is not jaundiced or pale.     Findings: No erythema.  Neurological:     Mental Status: He is alert and oriented to person, place, and time.     Motor: No weakness.  Gait: Gait normal.  Psychiatric:        Mood and Affect: Mood normal.        Behavior: Behavior normal.        Thought Content: Thought content normal.        Judgment: Judgment normal.    Cognitive Testing - 6-CIT  Correct? Score   What year is it? yes 0 Yes = 0    No = 4  What month is it? yes 0 Yes = 0    No = 3  Remember:     [address]ia [address], [place], [place]     What time is it? yes 0 Yes = 0    No = 3  Count backwards from 20 to 1 yes 0 Correct = 0    1 error = 2   More than 1 error = 4  Say the months of the year in reverse. yes 0 Correct = 0    1 error = 2   More than 1 error = 4  What address did I ask you to remember? yes 0 Correct = 0  1 error = 2    2 error = 4    3 error = 6    4 error = 8    All wrong = 10       TOTAL SCORE  0/28   Interpretation:  Normal  Normal (0-7) Abnormal (8-28)    Assessment & Plan:     Annual Wellness Visit  Reviewed patient's Family Medical History Reviewed and updated list of patient's medical providers Assessment of cognitive impairment was done and was normal Assessed patient's functional ability - patient lives by himself, able to care for himself currently Established a written schedule for health screening services - due for blood work, completed today.  Encouraged Shingles vaccine. Health Risk Assessent Completed and Reviewed  Immunization History  Administered Date(s) Administered  . Fluad Quad(high Dose 65+) 10/19/2018, 10/24/2019  . Influenza Whole 11/10/2009  . Influenza, High Dose Seasonal PF 11/01/2016, 11/02/2017  . Influenza,inj,Quad PF,6+ Mos 10/18/2012, 01/02/2014  . Influenza-Unspecified 10/15/2014,  11/10/2015  . PFIZER(Purple Top)SARS-COV-2 Vaccination 02/14/2019, 03/07/2019, 12/03/2019  . Pneumococcal Conjugate-13 05/31/2013  . Pneumococcal Polysaccharide-23 11/30/2010  . Td 05/26/2010  . Tdap 05/26/2010    Health Maintenance  Topic Date Due  . TETANUS/TDAP  05/25/2020  . INFLUENZA VACCINE  08/25/2020  . COVID-19 Vaccine  Completed  . PNA vac Low Risk Adult  Completed  . HPV VACCINES  Aged Out    Discussed health benefits of physical activity, and encouraged him to engage in regular exercise appropriate for his age and condition.   Meds ordered this encounter  Medications  . omeprazole (PRILOSEC) 20 MG capsule    Sig: Take 1 capsule (20 mg total) by mouth daily.    Dispense:  60 capsule    Refill:  0    Order Specific Question:   Supervising Provider    Answer:   Jenna Luo T [[id number]]    Current Outpatient Medications:  .  omeprazole (PRILOSEC) 20 MG capsule, Take 1 capsule (20 mg total) by mouth daily., Disp: 60 capsule, Rfl: 0 .  aspirin EC 81 MG tablet, Take 1 tablet (81 mg total) by mouth daily., Disp: 30 tablet, Rfl: 11 .  benazepril (LOTENSIN) 40 MG tablet, Take 1 tablet (40 mg total) by mouth daily., Disp: 90 tablet, Rfl: 3 .  chlorthalidone (HYGROTON) 25 MG tablet, Take 1 tablet (25 mg total)  by mouth daily., Disp: 90 tablet, Rfl: 3 .  Cholecalciferol (VITAMIN D) 2000 UNITS CAPS, Take 1 capsule (2,000 Units total) by mouth daily., Disp: 30 capsule, Rfl: 11 .  diclofenac Sodium (VOLTAREN) 1 % GEL, Apply 4 g topically 4 (four) times daily as needed., Disp: 500 g, Rfl: 6 .  finasteride (PROSCAR) 5 MG tablet, TAKE 1 TABLET BY MOUTH  DAILY, Disp: 90 tablet, Rfl: 3 .  hydrALAZINE (APRESOLINE) 50 MG tablet, Take 1 tablet (50 mg total) by mouth 3 (three) times daily., Disp: 270 tablet, Rfl: 3 .  terazosin (HYTRIN) 10 MG capsule, TAKE 1 CAPSULE BY MOUTH  DAILY AT BEDTIME, Disp: 90 capsule, Rfl: 3 .  triamcinolone cream (KENALOG) 0.1 %, Apply 1 application topically 2  (two) times daily., Disp: 30 g, Rfl: 1 There are no discontinued medications.  Next Medicare Wellness Visit in 12+ months

## 2020-04-30 NOTE — Patient Instructions (Addendum)
8 week f/u gerd  Food Choices for Gastroesophageal Reflux Disease, Adult When you have gastroesophageal reflux disease (GERD), the foods you eat and your eating habits are very important. Choosing the right foods can help ease your discomfort. Think about working with a food expert (dietitian) to help you make good choices. What are tips for following this plan? Reading food labels  Look for foods that are low in saturated fat. Foods that may help with your symptoms include: ? Foods that have less than 5% of daily value (DV) of fat. ? Foods that have 0 grams of trans fat. Cooking  Do not fry your food.  Cook your food by baking, steaming, grilling, or broiling. These are all methods that do not need a lot of fat for cooking.  To add flavor, try to use herbs that are low in spice and acidity. Meal planning  Choose healthy foods that are low in fat, such as: ? Fruits and vegetables. ? Whole grains. ? Low-fat dairy products. ? Lean meats, fish, and poultry.  Eat small meals often instead of eating 3 large meals each day. Eat your meals slowly in a place where you are relaxed. Avoid bending over or lying down until 2-3 hours after eating.  Limit high-fat foods such as fatty meats or fried foods.  Limit your intake of fatty foods, such as oils, butter, and shortening.  Avoid the following as told by your doctor: ? Foods that cause symptoms. These may be different for different people. Keep a food diary to keep track of foods that cause symptoms. ? Alcohol. ? Drinking a lot of liquid with meals. ? Eating meals during the 2-3 hours before bed.   Lifestyle  Stay at a healthy weight. Ask your doctor what weight is healthy for you. If you need to lose weight, work with your doctor to do so safely.  Exercise for at least 30 minutes on 5 or more days each week, or as told by your doctor.  Wear loose-fitting clothes.  Do not smoke or use any products that contain nicotine or tobacco.  If you need help quitting, ask your doctor.  Sleep with the head of your bed higher than your feet. Use a wedge under the mattress or blocks under the bed frame to raise the head of the bed.  Chew sugar-free gum after meals. What foods should eat? Eat a healthy, well-balanced diet of fruits, vegetables, whole grains, low-fat dairy products, lean meats, fish, and poultry. Each person is different. Foods that may cause symptoms in one person may not cause any symptoms in another person. Work with your doctor to find foods that are safe for you. The items listed above may not be a complete list of what you can eat and drink. Contact a food expert for more options.   What foods should I avoid? Limiting some of these foods may help in managing the symptoms of GERD. Everyone is different. Talk with a food expert or your doctor to help you find the exact foods to avoid, if any. Fruits Any fruits prepared with added fat. Any fruits that cause symptoms. For some people, this may include citrus fruits, such as oranges, grapefruit, pineapple, and lemons. Vegetables Deep-fried vegetables. [place] fries. Any vegetables prepared with added fat. Any vegetables that cause symptoms. For some people, this may include tomatoes and tomato products, chili peppers, onions and garlic, and horseradish. Grains Pastries or quick breads with added fat. Meats and other proteins High-fat meats, such  as fatty beef or pork, hot dogs, ribs, ham, sausage, salami, and bacon. Fried meat or protein, including fried fish and fried chicken. Nuts and nut butters, in large amounts. Dairy Whole milk and chocolate milk. Sour cream. Cream. Ice cream. Cream cheese. Milkshakes. Fats and oils Butter. Margarine. Shortening. Ghee. Beverages Coffee and tea, with or without caffeine. Carbonated beverages. Sodas. Energy drinks. Fruit juice made with acidic fruits, such as orange or grapefruit. Tomato juice. Alcoholic drinks. Sweets and  desserts Chocolate and cocoa. Donuts. Seasonings and condiments Pepper. Peppermint and spearmint. Added salt. Any condiments, herbs, or seasonings that cause symptoms. For some people, this may include curry, hot sauce, or vinegar-based salad dressings. The items listed above may not be a complete list of what you should not eat and drink. Contact a food expert for more options. Questions to ask your doctor Diet and lifestyle changes are often the first steps that are taken to manage symptoms of GERD. If diet and lifestyle changes do not help, talk with your doctor about taking medicines. Where to find more information  International Foundation for Gastrointestinal Disorders: aboutgerd.org Summary  When you have GERD, food and lifestyle choices are very important in easing your symptoms.  Eat small meals often instead of 3 large meals a day. Eat your meals slowly and in a place where you are relaxed.  Avoid bending over or lying down until 2-3 hours after eating.  Limit high-fat foods such as fatty meats or fried foods. This information is not intended to replace advice given to you by your health care provider. Make sure you discuss any questions you have with your health care provider. Document Revised: 07/23/2019 Document Reviewed: 07/23/2019 Elsevier Patient Education  [employer].

## 2020-05-01 ENCOUNTER — Other Ambulatory Visit: Payer: Self-pay | Admitting: *Deleted

## 2020-05-01 ENCOUNTER — Encounter: Payer: Self-pay | Admitting: Nurse Practitioner

## 2020-05-01 DIAGNOSIS — K219 Gastro-esophageal reflux disease without esophagitis: Secondary | ICD-10-CM

## 2020-05-01 DIAGNOSIS — E119 Type 2 diabetes mellitus without complications: Secondary | ICD-10-CM | POA: Insufficient documentation

## 2020-05-01 HISTORY — DX: Type 2 diabetes mellitus without complications: E11.9

## 2020-05-01 LAB — CBC WITH DIFFERENTIAL/PLATELET
Absolute Monocytes: 950 cells/uL (ref 200–950)
Basophils Absolute: 48 cells/uL (ref 0–200)
Basophils Relative: 0.5 %
Eosinophils Absolute: 314 cells/uL (ref 15–500)
Eosinophils Relative: 3.3 %
HCT: 39.3 % (ref 38.5–50.0)
Hemoglobin: 12.8 g/dL — ABNORMAL LOW (ref 13.2–17.1)
Lymphs Abs: 2071 cells/uL (ref 850–3900)
MCH: 28.8 pg (ref 27.0–33.0)
MCHC: 32.6 g/dL (ref 32.0–36.0)
MCV: 88.5 fL (ref 80.0–100.0)
MPV: 10.7 fL (ref 7.5–12.5)
Monocytes Relative: 10 %
Neutro Abs: 6118 cells/uL (ref 1500–7800)
Neutrophils Relative %: 64.4 %
Platelets: 206 10*3/uL (ref 140–400)
RBC: 4.44 10*6/uL (ref 4.20–5.80)
RDW: 13.7 % (ref 11.0–15.0)
Total Lymphocyte: 21.8 %
WBC: 9.5 10*3/uL (ref 3.8–10.8)

## 2020-05-01 LAB — COMPLETE METABOLIC PANEL WITH GFR
AG Ratio: 1.5 (calc) (ref 1.0–2.5)
ALT: 18 U/L (ref 9–46)
AST: 25 U/L (ref 10–35)
Albumin: 4 g/dL (ref 3.6–5.1)
Alkaline phosphatase (APISO): 41 U/L (ref 35–144)
BUN/Creatinine Ratio: 18 (calc) (ref 6–22)
BUN: 21 mg/dL (ref 7–25)
CO2: 27 mmol/L (ref 20–32)
Calcium: 10.1 mg/dL (ref 8.6–10.3)
Chloride: 104 mmol/L (ref 98–110)
Creat: 1.2 mg/dL — ABNORMAL HIGH (ref 0.70–1.11)
GFR, Est African American: 64 mL/min/{1.73_m2} (ref >=60)
GFR, Est Non African American: 55 mL/min/{1.73_m2} — ABNORMAL LOW (ref >=60)
Globulin: 2.7 g/dL (calc) (ref 1.9–3.7)
Glucose, Bld: 103 mg/dL — ABNORMAL HIGH (ref 65–99)
Potassium: 3.9 mmol/L (ref 3.5–5.3)
Sodium: 141 mmol/L (ref 135–146)
Total Bilirubin: 0.4 mg/dL (ref 0.2–1.2)
Total Protein: 6.7 g/dL (ref 6.1–8.1)

## 2020-05-01 LAB — HEMOGLOBIN A1C
Hgb A1c MFr Bld: 6.4 % of total Hgb — ABNORMAL HIGH (ref <5.7)
Mean Plasma Glucose: 137 mg/dL
eAG (mmol/L): 7.6 mmol/L

## 2020-05-01 MED ORDER — OMEPRAZOLE 20 MG PO CPDR: 20.0000 mg | DELAYED_RELEASE_CAPSULE | Freq: Every day | ORAL | 0 refills | Status: DC

## 2020-06-05 DIAGNOSIS — H04203 Unspecified epiphora, bilateral lacrimal glands: Secondary | ICD-10-CM | POA: Diagnosis not present

## 2020-06-05 DIAGNOSIS — H02132 Senile ectropion of right lower eyelid: Secondary | ICD-10-CM | POA: Diagnosis not present

## 2020-06-05 DIAGNOSIS — H02135 Senile ectropion of left lower eyelid: Secondary | ICD-10-CM | POA: Diagnosis not present

## 2020-06-05 DIAGNOSIS — H189 Unspecified disorder of cornea: Secondary | ICD-10-CM | POA: Diagnosis not present

## 2020-06-09 ENCOUNTER — Ambulatory Visit: Payer: Medicare Other | Admitting: Cardiology

## 2020-06-26 ENCOUNTER — Encounter: Payer: Self-pay | Admitting: Nurse Practitioner

## 2020-06-26 ENCOUNTER — Ambulatory Visit: Payer: Medicare Other | Admitting: Cardiology

## 2020-06-26 ENCOUNTER — Ambulatory Visit (INDEPENDENT_AMBULATORY_CARE_PROVIDER_SITE_OTHER): Payer: Medicare Other | Admitting: Nurse Practitioner

## 2020-06-26 ENCOUNTER — Other Ambulatory Visit: Payer: Self-pay

## 2020-06-26 VITALS — BP 164/92 | HR 82 | Temp 97.8°F | Ht 66.0 in | Wt 191.6 lb

## 2020-06-26 DIAGNOSIS — E7800 Pure hypercholesterolemia, unspecified: Secondary | ICD-10-CM | POA: Diagnosis not present

## 2020-06-26 DIAGNOSIS — I10 Essential (primary) hypertension: Secondary | ICD-10-CM | POA: Diagnosis not present

## 2020-06-26 NOTE — Assessment & Plan Note (Signed)
Chronic.  Patient reports blood pressure is at or below goal at home.  Elevated in office today, however patient has not taken blood pressure medication today.  Encouraged DASH diet and continue following with cardiology for collaboration.  Recent kidney function and electrolytes stable.  Follow-up 4 months.

## 2020-06-26 NOTE — Progress Notes (Signed)
Subjective:    Patient ID: Patrick Villa, male    DOB: Jul 11, 1933, 87 y.o.   MRN: [id number]  HPI: Patrick Villa is a 87 y.o. male presenting for  Chief Complaint  Patient presents with  . Hypertension  . Hyperlipidemia  . Gastroesophageal Reflux   HYPERTENSION / HYPERLIPIDEMIA Had pork chops for dinner last night.  Has not taken medication yet today.  Usually takes between 830-9 am and 830-9pm.  Currently taking benazepril 40 mg daily, chlorthalidone 25 mg daily, hydralazine 50 mg twice daily.   Currently very active and growing his garden- his fever is growing okra. Satisfied with current treatment? yes Duration of hypertension: chronic BP monitoring frequency: daily BP range: 118-139/60-80 BP medication side effects: no Duration of hyperlipidemia: chronic Cholesterol medication side effects: yes Past cholesterol medications: Atorvastatin, pravastatin Aspirin: yes Recent stressors: no Recurrent headaches: no Visual changes: no Palpitations: no Dyspnea: no Chest pain: no Lower extremity edema: no Dizzy/lightheaded: no The ASCVD Risk score Mikey Bussing DC Jr., et al., 2013) failed to calculate for the following reasons:   The 2013 ASCVD risk score is only valid for ages 8 to 81 Water intake - drinks plenty at least 8 cups daily .  Patient reports the stomach pain improved with omeprazole, but then thinks the omeprazole stopped working.  He stopped the omeprazole.  His stomach pain has since improved all the way.  Of note, patient also reports history of BPH and currently taking terazosin 10 mg daily and finasteride 5 mg daily.  He reports his symptoms are controlled.  Allergies  Allergen Reactions  . Amlodipine Other (See Comments)    Patient developed jitteriness, tremors and unstable gait.  Same issues when re-challenged  . Lipitor [Atorvastatin Calcium]     Weakness.  . Zocor [Simvastatin]   . Bactrim [Sulfamethoxazole-Trimethoprim] Rash  . Pravachol Hives and Rash     Outpatient Encounter Medications as of 06/26/2020  Medication Sig  . aspirin EC 81 MG tablet Take 1 tablet (81 mg total) by mouth daily.  . benazepril (LOTENSIN) 40 MG tablet Take 1 tablet (40 mg total) by mouth daily.  . chlorthalidone (HYGROTON) 25 MG tablet Take 1 tablet (25 mg total) by mouth daily.  . Cholecalciferol (VITAMIN D) 2000 UNITS CAPS Take 1 capsule (2,000 Units total) by mouth daily.  . diclofenac Sodium (VOLTAREN) 1 % GEL Apply 4 g topically 4 (four) times daily as needed.  . finasteride (PROSCAR) 5 MG tablet TAKE 1 TABLET BY MOUTH  DAILY  . hydrALAZINE (APRESOLINE) 50 MG tablet Take 1 tablet (50 mg total) by mouth 3 (three) times daily.  Marland Kitchen terazosin (HYTRIN) 10 MG capsule TAKE 1 CAPSULE BY MOUTH  DAILY AT BEDTIME  . triamcinolone cream (KENALOG) 0.1 % Apply 1 application topically 2 (two) times daily.  . [DISCONTINUED] omeprazole (PRILOSEC) 20 MG capsule Take 1 capsule (20 mg total) by mouth daily.  . fluorometholone (FML) 0.1 % ophthalmic suspension SMARTSIG:In Eye(s)  . ofloxacin (OCUFLOX) 0.3 % ophthalmic solution   . prednisoLONE acetate (PRED FORTE) 1 % ophthalmic suspension    No facility-administered encounter medications on file as of 06/26/2020.    Patient Active Problem List   Diagnosis Date Noted  . Diabetes mellitus without complication ([place]) 09/32/3557  . Bilateral foot pain 04/17/2020  . ED (erectile dysfunction) 03/27/2019  . BPH (benign prostatic hyperplasia) 05/23/2018  . 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 Vitamin D deficiency 07/17/2014  . Uncontrolled hypertension   .  COPD (chronic obstructive pulmonary disease) ([place])   . Hyperlipidemia   . Colon polyps   . Systolic murmur   . Ganglion cyst     Past Medical History:  Diagnosis Date  . Colon polyps   . COPD (chronic obstructive pulmonary disease) ([place])   . Echocardiogram abnormal   . Ganglion cyst   . Hematuria   . Hyperlipidemia   . Hypertension   . Systolic murmur      Relevant past medical, surgical, family and social history reviewed and updated as indicated. Interim medical history since our last visit reviewed.  Review of Systems Per HPI unless specifically indicated above     Objective:    BP (!) 164/92   Pulse 82   Temp 97.8 F (36.6 C)   Ht 5\' 6"  (1.676 m)   Wt 191 lb 9.6 oz (86.9 kg)   SpO2 97%   BMI 30.93 kg/m   Wt Readings from Last 3 Encounters:  06/26/20 191 lb 9.6 oz (86.9 kg)  04/30/20 194 lb (88 kg)  04/17/20 193 lb (87.5 kg)    Physical Exam Vitals and nursing note reviewed.  Constitutional:      General: He is not in acute distress.    Appearance: Normal appearance. He is not toxic-appearing.  HENT:     Head: Normocephalic and atraumatic.  Eyes:     General: No scleral icterus.    Extraocular Movements: Extraocular movements intact.  Cardiovascular:     Rate and Rhythm: Normal rate and regular rhythm.     Heart sounds: Murmur heard.    Pulmonary:     Effort: Pulmonary effort is normal. No respiratory distress.     Breath sounds: Normal breath sounds. No wheezing, rhonchi or rales.  Musculoskeletal:     Cervical back: Normal range of motion.     Right lower leg: No edema.     Left lower leg: No edema.  Lymphadenopathy:     Cervical: No cervical adenopathy.  Skin:    General: Skin is warm and dry.     Capillary Refill: Capillary refill takes less than 2 seconds.     Coloration: Skin is not jaundiced or pale.     Findings: No erythema.  Neurological:     Mental Status: He is alert and oriented to person, place, and time.     Motor: No weakness.     Gait: Gait normal.  Psychiatric:        Mood and Affect: Mood normal.        Behavior: Behavior normal.        Thought Content: Thought content normal.        Judgment: Judgment normal.       Assessment & Plan:   Problem List Items Addressed This Visit      Cardiovascular and Mediastinum   Uncontrolled hypertension - Primary    Chronic.  Patient  reports blood pressure is at or below goal at home.  Elevated in office today, however patient has not taken blood pressure medication today.  Encouraged DASH diet and continue following with cardiology for collaboration.  Recent kidney function and electrolytes stable.  Follow-up 4 months.        Other   Hyperlipidemia    Chronic.  Previously intolerant to statins.  Discussed Zetia, however patient is skeptical about starting a new medication at his age that may, side effects and affect his ability to garden.  We will recheck lipids today-patient is fasting and discussed possible  treatment pending results.  Follow-up 4 months.      Relevant Orders   Lipid Panel       Follow up plan: Return in about 4 months (around 10/26/2020) for f/u.

## 2020-06-26 NOTE — Assessment & Plan Note (Signed)
Chronic.  Previously intolerant to statins.  Discussed Zetia, however patient is skeptical about starting a new medication at his age that may, side effects and affect his ability to garden.  We will recheck lipids today-patient is fasting and discussed possible treatment pending results.  Follow-up 4 months.

## 2020-06-27 LAB — LIPID PANEL
Cholesterol: 191 mg/dL (ref <200)
HDL: 44 mg/dL (ref >=40)
LDL Cholesterol (Calc): 132 mg/dL (calc) — ABNORMAL HIGH
Non-HDL Cholesterol (Calc): 147 mg/dL (calc) — ABNORMAL HIGH (ref <130)
Total CHOL/HDL Ratio: 4.3 (calc) (ref <5.0)
Triglycerides: 56 mg/dL (ref <150)

## 2020-06-30 ENCOUNTER — Telehealth: Payer: Self-pay

## 2020-06-30 ENCOUNTER — Encounter: Payer: Self-pay | Admitting: *Deleted

## 2020-07-15 ENCOUNTER — Encounter: Payer: Self-pay | Admitting: Family Medicine

## 2020-07-15 ENCOUNTER — Ambulatory Visit (INDEPENDENT_AMBULATORY_CARE_PROVIDER_SITE_OTHER): Payer: Medicare Other | Admitting: Family Medicine

## 2020-07-15 ENCOUNTER — Other Ambulatory Visit: Payer: Self-pay

## 2020-07-15 VITALS — BP 148/88 | HR 86 | Temp 98.1°F | Resp 16 | Ht 66.0 in | Wt 191.0 lb

## 2020-07-15 DIAGNOSIS — B029 Zoster without complications: Secondary | ICD-10-CM | POA: Diagnosis not present

## 2020-07-15 MED ORDER — TRIAMCINOLONE ACETONIDE 0.1 % EX CREA
1.0000 | TOPICAL_CREAM | Freq: Two times a day (BID) | CUTANEOUS | 0 refills | Status: DC
Start: 2020-07-15 — End: 2020-08-13

## 2020-07-15 MED ORDER — VALACYCLOVIR HCL 1 G PO TABS: 1000.0000 mg | ORAL_TABLET | Freq: Three times a day (TID) | ORAL | 0 refills | Status: AC

## 2020-07-15 NOTE — Progress Notes (Signed)
Subjective:    Patient ID: Patrick Villa, male    DOB: 12/10/1933, 87 y.o.   MRN: [id number]  HPI Patient states that he was bitten on the neck by an insect yesterday.  He was driving a tractor and he felt something sting him on his neck.  He kept putting his hand back there trying to knock it off however he never felt anything biting him.  He never felt an insect.  He did not see anything biting him.  Today he has a patch of papules and vesicles forming on the right side of his neck.   Past Medical History  Diagnosis Date   Hypertension    COPD (chronic obstructive pulmonary disease)    Hyperlipidemia    Colon polyps    Systolic murmur    Echocardiogram abnormal    Hematuria    Ganglion cyst    Past Surgical History  Procedure Laterality Date   Colon surgery     Transthoracic echocardiogram     Current Outpatient Prescriptions on File Prior to Visit  Medication Sig Dispense Refill   aspirin 325 MG tablet Take 325 mg by mouth daily.       benazepril-hydrochlorthiazide (LOTENSIN HCT) 20-25 MG per tablet TAKE 1 TABLET EVERY DAY 30 tablet 5   diltiazem (CARDIZEM) 90 MG tablet TAKE ONE TABLET BY MOUTH THREE TIMES DAILY 90 tablet 5   terazosin (HYTRIN) 5 MG capsule Take 1 capsule by mouth daily.     No current facility-administered medications on file prior to visit.   Allergies  Allergen Reactions   Amlodipine Besy-Benazepril Hcl     Jittery, gets confused.   Lipitor [Atorvastatin Calcium]     Weakness.   Zocor [Simvastatin]    Bactrim [Sulfamethoxazole-Trimethoprim] Rash   Pravachol Hives and Rash   History   Social History   Marital Status: Widowed    Spouse Name: N/A    Number of Children: N/A   Years of Education: N/A   Occupational History   Not on file.   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   No other exercise   Plays guitar.   Separated. Lives alone.      Review of Systems  All other systems reviewed and are negative.     Objective:   Physical Exam Vitals reviewed.  Constitutional:      General: He is not in acute distress.    Appearance: Normal appearance. He is not ill-appearing, toxic-appearing or diaphoretic.  HENT:     Nose: Nose normal. No congestion or rhinorrhea.     Mouth/Throat:     Mouth: Mucous membranes are moist.     Pharynx: No oropharyngeal exudate or posterior oropharyngeal erythema.  Cardiovascular:     Rate and Rhythm: Normal rate and regular rhythm.     Pulses: Normal pulses.     Heart sounds: Murmur heard.    No friction rub. No gallop.  Pulmonary:     Effort: Pulmonary effort is normal. No respiratory distress.     Breath sounds: Normal breath sounds. No stridor. No wheezing, rhonchi or rales.  Musculoskeletal:     Cervical back: Normal  range of motion and neck supple. No rigidity or tenderness.  Lymphadenopathy:     Cervical: No cervical adenopathy.  Skin:    Findings: Erythema and rash present.  Neurological:     General: No focal deficit present.     Mental Status: He is alert and oriented to person, place, and time. Mental status is at baseline.     Cranial Nerves: No cranial nerve deficit.     Sensory: No sensory deficit.     Motor: No weakness.     Coordination: Coordination normal.          Assessment & Plan:  Herpes zoster without complication I believe the patient is showing shingles.  I counted over 18 erythematous vesicles and papules in a cluster in the area shown.  I do not believe this would be consistent with an insect bite.  Furthermore the area is itching and burning today.  I believe this is likely due to shingles.  Begin Valtrex 1 g p.o. 3 times daily for 7 days and use triamcinolone cream applied  2-3 times a day as needed for itching.  Recheck if worsening

## 2020-07-18 ENCOUNTER — Ambulatory Visit: Payer: Medicare Other | Admitting: Cardiology

## 2020-07-25 ENCOUNTER — Telehealth: Payer: Self-pay | Admitting: Nurse Practitioner

## 2020-07-25 DIAGNOSIS — U071 COVID-19: Secondary | ICD-10-CM | POA: Diagnosis not present

## 2020-07-25 NOTE — Telephone Encounter (Signed)
Spoke with pt, says she feels fine. Spoke with him regarding otc that can be purchased for covid sx. Instructed to call ems if he feels sob

## 2020-07-25 NOTE — Telephone Encounter (Signed)
Patient tested positive for COVID last night. Patient called to request medication for symptoms: cough, running nose, sore throat.  Pharmacy confirmed as   [facility] : [facility] [place], [place] [address]  [place], [place] [address]  Phone:  [phone]  Fax:  [phone]  DEA #:  [id number]  Please advise at [phone] or [phone].

## 2020-07-27 DIAGNOSIS — Z7189 Other specified counseling: Secondary | ICD-10-CM | POA: Diagnosis not present

## 2020-07-27 DIAGNOSIS — U071 COVID-19: Secondary | ICD-10-CM | POA: Diagnosis not present

## 2020-08-05 DIAGNOSIS — Z03818 Encounter for observation for suspected exposure to other biological agents ruled out: Secondary | ICD-10-CM | POA: Diagnosis not present

## 2020-08-13 ENCOUNTER — Other Ambulatory Visit: Payer: Self-pay

## 2020-08-13 ENCOUNTER — Ambulatory Visit (HOSPITAL_BASED_OUTPATIENT_CLINIC_OR_DEPARTMENT_OTHER): Payer: Medicare Other | Admitting: Cardiology

## 2020-08-13 ENCOUNTER — Encounter (HOSPITAL_BASED_OUTPATIENT_CLINIC_OR_DEPARTMENT_OTHER): Payer: Self-pay | Admitting: Cardiology

## 2020-08-13 VITALS — BP 142/72 | HR 96 | Ht 66.0 in | Wt 188.2 lb

## 2020-08-13 DIAGNOSIS — I10 Essential (primary) hypertension: Secondary | ICD-10-CM | POA: Diagnosis not present

## 2020-08-13 DIAGNOSIS — R011 Cardiac murmur, unspecified: Secondary | ICD-10-CM

## 2020-08-13 DIAGNOSIS — E7800 Pure hypercholesterolemia, unspecified: Secondary | ICD-10-CM

## 2020-08-13 DIAGNOSIS — R0789 Other chest pain: Secondary | ICD-10-CM | POA: Diagnosis not present

## 2020-08-13 NOTE — Progress Notes (Signed)
Cardiology Office Note:    Date:  08/13/2020   ID:  THORSTEN CLIMER, DOB Mar 13, 1933, MRN [id number]  PCP:  Eulogio Bear, NP  Cardiologist:  Buford Dresser, MD PhD  Referring MD: Alycia Rossetti, MD   CC: follow up of hypertension  History of Present Illness:    PRINCE COUEY is a 87 y.o. male with a hx of COPD, hyperlipidemia, hypertension, second degree AV block type I who is seen in follow up today. I initially saw him as a new consult 01/2018 at the request of Carroll, Modena Nunnery, MD for the evaluation and management of second degree, type I heart block.   He was seen in the hospital on 01/27/18 and 01/28/18 by Drs. Fredrik Rigger for bilateral breast tenderness. At that time, troponins were negative, echo was unremarkable. Cardizem was stopped due to second degree AV block, type I.   Today: He is feeling good overall, and reports he made a few medication changes since 05/2020.   Current regimen: AM:  25 mg Hydralazine, Vitamin D, Finasteride PM:  25 mg Hydralazine, 25 mg Chorthalidone, Terazosin  After taking benazepril, he felt awful. He eliminated this and began to feel better. The 50 mg dose of hydralazine caused a side effect of chest pain. This has improved on 25 mg hydralazine, however his blood pressure is still fluctuating somewhat. He also presents a blood pressure log. Since changing his medications, his highest recorded blood pressure is 151/73, and his average readings are in the 120s-130s.  For his diet, his appetite remains healthy.  Occasionally he does have frequency at night. This is not bothersome for him.  He denies any shortness of breath, palpitations, or exertional symptoms. No headaches, lightheadedness, or syncope to report. Also has no lower extremity edema, orthopnea or PND.   Past Medical History:  Diagnosis Date   Colon polyps    COPD (chronic obstructive pulmonary disease) ([facility])    Echocardiogram abnormal    Ganglion cyst    Hematuria     Hyperlipidemia    Hypertension    Systolic murmur     Past Surgical History:  Procedure Laterality Date   COLON SURGERY     TRANSTHORACIC ECHOCARDIOGRAM      Current Medications: Current Outpatient Medications on File Prior to Visit  Medication Sig   aspirin EC 81 MG tablet Take 1 tablet (81 mg total) by mouth daily.   benazepril (LOTENSIN) 40 MG tablet Take 1 tablet (40 mg total) by mouth daily.   chlorthalidone (HYGROTON) 25 MG tablet Take 1 tablet (25 mg total) by mouth daily.   Cholecalciferol (VITAMIN D) 2000 UNITS CAPS Take 1 capsule (2,000 Units total) by mouth daily.   diclofenac Sodium (VOLTAREN) 1 % GEL Apply 4 g topically 4 (four) times daily as needed.   finasteride (PROSCAR) 5 MG tablet TAKE 1 TABLET BY MOUTH  DAILY   fluorometholone (FML) 0.1 % ophthalmic suspension SMARTSIG:In Eye(s)   hydrALAZINE (APRESOLINE) 50 MG tablet Take 1 tablet (50 mg total) by mouth 3 (three) times daily.   ofloxacin (OCUFLOX) 0.3 % ophthalmic solution    prednisoLONE acetate (PRED FORTE) 1 % ophthalmic suspension    terazosin (HYTRIN) 10 MG capsule TAKE 1 CAPSULE BY MOUTH  DAILY AT BEDTIME   triamcinolone cream (KENALOG) 0.1 % Apply 1 application topically 2 (two) times daily.   triamcinolone cream (KENALOG) 0.1 % Apply 1 application topically 2 (two) times daily.   No current facility-administered medications on file prior  to visit.     Allergies:   Amlodipine, Lipitor [atorvastatin calcium], Zocor [simvastatin], Bactrim [sulfamethoxazole-trimethoprim], and Pravachol   Social History   Tobacco Use   Smoking status: Former    Types: Cigarettes    Quit date: 10/18/1981    Years since quitting: 38.8   Smokeless tobacco: Never  Vaping Use   Vaping Use: Never used  Substance Use Topics   Alcohol use: No   Drug use: No    Family History: The patient's family history includes Diabetes in his mother; Stroke in his brother.  ROS:   Please see the history of present illness.    (+) Urinary frequency Additional pertinent ROS negative except as documented.    EKGs/Labs/Other Studies Reviewed:    The following studies were reviewed today:  [place] 02/06/2019: Nuclear stress EF: 48%. The left ventricular ejection fraction is mildly decreased (45-54%). No T wave inversion was noted during stress. There was no ST segment deviation noted during stress. This is an intermediate risk study. No ischemia. LVEF 48% with dilated LV and global hypokinesis. This is an intermediate risk study.  Echo 01/28/18 - Left ventricle: The cavity size was normal. Wall thickness was   normal. Systolic function was normal. The estimated ejection   fraction was in the range of 55% to 60%. Wall motion was normal;   there were no regional wall motion abnormalities. The study is   not technically sufficient to allow evaluation of LV diastolic   function. - Atrial septum: There was an atrial septal aneurysm.   Impressions: - Normal LV function; sclerotic aortic valve.  EKG:  EKG is personally reviewed.    08/13/2020: not ordered today 03/11/2020: sinus rhythm, 2nd degree AV block type 1, LAFB, PRWP. Compared to prior ECG, there is now nonspecific ST-T pattern in V3-V6  Recent Labs: 04/30/2020: ALT 18; BUN 21; Creat 1.20; Hemoglobin 12.8; Platelets 206; Potassium 3.9; Sodium 141  Recent Lipid Panel    Component Value Date/Time   CHOL 191 06/26/2020 0832   TRIG 56 06/26/2020 0832   HDL 44 06/26/2020 0832   CHOLHDL 4.3 06/26/2020 0832   VLDL 18 01/28/2018 0538   LDLCALC 132 (H) 06/26/2020 0832    Physical Exam:    VS:  BP (!) 142/72   Pulse 96   Ht 5\' 6"  (1.676 m)   Wt 188 lb 3.2 oz (85.4 kg)   SpO2 98%   BMI 30.38 kg/m     Wt Readings from Last 3 Encounters:  08/13/20 188 lb 3.2 oz (85.4 kg)  07/15/20 191 lb (86.6 kg)  06/26/20 191 lb 9.6 oz (86.9 kg)    GEN: Well nourished, well developed in no acute distress HEENT: Normal, moist mucous membranes NECK:  No JVD CARDIAC: regular rhythm, normal S1 and S2, no rubs or gallops. 1/6 systolic murmur. VASCULAR: Radial and DP pulses 2+ bilaterally. No carotid bruits RESPIRATORY:  Clear to auscultation without rales, wheezing or rhonchi  ABDOMEN: Soft, non-tender, non-distended MUSCULOSKELETAL:  Ambulates independently SKIN: Warm and dry, no edema NEUROLOGIC:  Alert and oriented x 3. No focal neuro deficits noted. PSYCHIATRIC:  Normal affect    ASSESSMENT:    1. Essential hypertension   2. Pure hypercholesterolemia   3. Systolic murmur   4. Chest wall tenderness     PLAN:    Hypertension: -control much improved based on home logs -continue hydralazine, chlorthalidone, terazosin/finasteride -this has been a long term process, as there are medications he has not tolerated and  some that do not appear to affect his blood pressure  History of antihypertensives -tried amlodipine, had jitteriness/tremors. Had chest wall pain on spironolactone.  -no diltiazem/verapamil, beta blocker due to second degree AV block type 1 ([place]) -had aches/balance issues on chlorthalidone, but now tolerating -had worsening chest wall pain on >50 mg hydralazine  Aspirin use: reviewed indications/recommendations, but he wishes to continue.  Murmur: aortic sclerosis without stenosis, no further workup needed. Echo 01/2018  History of chest discomfort/tenderness: longstanding, atypical. Lexiscan normal. Mammogram normal.  Second degree AV block, type I: no indication for pacemaker  CV risk counseling and prevention recommendations -recommend heart healthy/Mediterranean diet, with whole grains, fruits, vegetable, fish, lean meats, nuts, and olive oil. Limit salt. -recommend moderate walking, 3-5 times/week for 30-50 minutes each session. Aim for at least 150 minutes.week. Goal should be pace of 3 miles/hours, or walking 1.5 miles in 30 minutes -recommend avoidance of tobacco products. Avoid excess  alcohol. -ASCVD risk score: The ASCVD Risk score Mikey Bussing DC Jr., et al., 2013) failed to calculate for the following reasons:   The 2013 ASCVD risk score is only valid for ages 31 to 53   Plan for follow up: 3 mos or sooner as needed  Buford Dresser, MD, PhD [place]  [facility]   Medication Adjustments/Labs and Tests Ordered: Current medicines are reviewed at length with the patient today.  Concerns regarding medicines are outlined above.  No orders of the defined types were placed in this encounter.  No orders of the defined types were placed in this encounter.   Patient Instructions  Medication Instructions:  Your Physician recommend you continue on your current medication as directed.    *If you need a refill on your cardiac medications before your next appointment, please call your pharmacy*   Lab Work: None ordered today   Testing/Procedures: None ordered today   Follow-Up: At [facility], you and your health needs are our priority.  As part of our continuing mission to provide you with exceptional heart care, we have created designated Provider Care Teams.  These Care Teams include your primary Cardiologist (physician) and Advanced Practice Providers (APPs -  Physician Assistants and Nurse Practitioners) who all work together to provide you with the care you need, when you need it.  We recommend signing up for the patient portal called "MyChart".  Sign up information is provided on this After Visit Summary.  MyChart is used to connect with patients for Virtual Visits (Telemedicine).  Patients are able to view lab/test results, encounter notes, upcoming appointments, etc.  Non-urgent messages can be sent to your provider as well.   To learn more about what you can do with MyChart, go to [email or web address].    Your next appointment:   3 month(s)  The format for your next appointment:   In Person  Provider:   Buford Dresser, MD      [facility],acting as a scribe for Buford Dresser, MD.,have documented all relevant documentation on the behalf of Buford Dresser, MD,as directed by  Buford Dresser, MD while in the presence of Buford Dresser, MD.  I, Buford Dresser, MD, have reviewed all documentation for this visit. The documentation on 09/11/20 for the exam, diagnosis, procedures, and orders are all accurate and complete.   Signed, Buford Dresser, MD PhD 08/13/2020    [place]

## 2020-08-13 NOTE — Patient Instructions (Signed)

## 2020-09-11 ENCOUNTER — Encounter (HOSPITAL_BASED_OUTPATIENT_CLINIC_OR_DEPARTMENT_OTHER): Payer: Self-pay | Admitting: Cardiology

## 2020-09-18 ENCOUNTER — Encounter: Payer: Self-pay | Admitting: Nurse Practitioner

## 2020-09-18 ENCOUNTER — Telehealth (INDEPENDENT_AMBULATORY_CARE_PROVIDER_SITE_OTHER): Payer: Medicare Other | Admitting: Nurse Practitioner

## 2020-09-18 DIAGNOSIS — J069 Acute upper respiratory infection, unspecified: Secondary | ICD-10-CM

## 2020-09-18 DIAGNOSIS — Z23 Encounter for immunization: Secondary | ICD-10-CM

## 2020-09-18 MED ORDER — SHINGRIX 50 MCG/0.5ML IM SUSR: 0.5000 mL | Freq: Once | INTRAMUSCULAR | 1 refills | Status: AC

## 2020-09-18 MED ORDER — BENZONATATE 100 MG PO CAPS: 100.0000 mg | ORAL_CAPSULE | Freq: Every evening | ORAL | 0 refills | Status: DC | PRN

## 2020-09-18 MED ORDER — GUAIFENESIN ER 600 MG PO TB12: 600.0000 mg | ORAL_TABLET | Freq: Two times a day (BID) | ORAL | 0 refills | Status: DC | PRN

## 2020-09-18 NOTE — Progress Notes (Signed)
Subjective:    Patient ID: Patrick Villa, male    DOB: 22-Jun-1933, 87 y.o.   MRN: [id number]  HPI: [name] is a 87 y.o. male presenting virtually for cough and congestion.  Chief Complaint  Patient presents with   URI   UPPER RESPIRATORY TRACT INFECTION Onset: ~1 week COVID-19 testing history: not tested for this occurence; tested positive in early July and recovered fully COVID-19 vaccination status: has had 3 doses of COVID vaccine Fever: no Cough: yes; productive; worse in morning Shortness of breath: no Wheezing:  yes; at night time, better with coughing Chest pain: no Chest tightness: no Chest congestion: yes Nasal congestion: yes Runny nose: no Post nasal drip: yes Sneezing: no Sore throat: yes Swollen glands: no Sinus pressure: no Headache: no Face pain: no Toothache: no Ear pain: no  Ear pressure: no  Eyes red/itching:no Eye drainage/crusting: no  Nausea: no  Vomiting: no Diarrhea: no  Change in appetite: no  Loss of taste/smell: no  Rash: no Fatigue: no Sick contacts: no Strep contacts: no  Context: better Recurrent sinusitis: no Treatments attempted: throat lozenge and cough drop Relief with OTC medications: yes  Allergies  Allergen Reactions   Amlodipine Other (See Comments)    Patient developed jitteriness, tremors and unstable gait.  Same issues when re-challenged   Lipitor [Atorvastatin Calcium]     Weakness.   Zocor [Simvastatin]    Bactrim [Sulfamethoxazole-Trimethoprim] Rash   Pravachol Hives and Rash    Outpatient Encounter Medications as of 09/18/2020  Medication Sig   benzonatate (TESSALON) 100 MG capsule Take 1 capsule (100 mg total) by mouth at bedtime as needed for cough. Take at night time and monitor for drowsiness.  Do not operate heavy machinery when taking this medication.   guaiFENesin (MUCINEX) 600 MG 12 hr tablet Take 1 tablet (600 mg total) by mouth 2 (two) times daily as needed for cough or to loosen phlegm.    Zoster Vaccine Adjuvanted [facility]) injection Inject 0.5 mLs into the muscle once for 1 dose. Administer second dose 2-6 months after first dose.   aspirin EC 81 MG tablet Take 1 tablet (81 mg total) by mouth daily.   chlorthalidone (HYGROTON) 25 MG tablet Take 1 tablet (25 mg total) by mouth daily.   Cholecalciferol (VITAMIN D) 2000 UNITS CAPS Take 1 capsule (2,000 Units total) by mouth daily.   diclofenac Sodium (VOLTAREN) 1 % GEL Apply 4 g topically 4 (four) times daily as needed.   finasteride (PROSCAR) 5 MG tablet TAKE 1 TABLET BY MOUTH  DAILY   hydrALAZINE (APRESOLINE) 50 MG tablet Take 25 mg by mouth in the morning and at bedtime.   terazosin (HYTRIN) 10 MG capsule TAKE 1 CAPSULE BY MOUTH  DAILY AT BEDTIME   triamcinolone cream (KENALOG) 0.1 % Apply 1 application topically 2 (two) times daily.   No facility-administered encounter medications on file as of 09/18/2020.    Patient Active Problem List   Diagnosis Date Noted   Diabetes mellitus without complication ([place]) 123456   Bilateral foot pain 04/17/2020   ED (erectile dysfunction) 03/27/2019   BPH (benign prostatic hyperplasia) 05/23/2018   Second degree AV block, Mobitz type I 02/20/2018   Vitamin D deficiency 07/17/2014   Uncontrolled hypertension    COPD (chronic obstructive pulmonary disease) ([place])    Hyperlipidemia    Colon polyps    Systolic murmur    Ganglion cyst     Past Medical History:  Diagnosis Date  Colon polyps    COPD (chronic obstructive pulmonary disease) (HCC)    Echocardiogram abnormal    Ganglion cyst    Hematuria    Hyperlipidemia    Hypertension    Systolic murmur     Relevant past medical, surgical, family and social history reviewed and updated as indicated. Interim medical history since our last visit reviewed.  Review of Systems Per HPI unless specifically indicated above     Objective:    There were no vitals taken for this visit.  Wt Readings from Last 3 Encounters:   08/13/20 188 lb 3.2 oz (85.4 kg)  07/15/20 191 lb (86.6 kg)  06/26/20 191 lb 9.6 oz (86.9 kg)    Physical Exam Physical examination unable to be performed due to lack of equipment.  Patient talking in complete sentences during telemedicine visit.  Results for orders placed or performed in visit on 06/26/20  Lipid Panel  Result Value Ref Range   Cholesterol 191 <200 mg/dL   HDL 44 > OR = 40 mg/dL   Triglycerides 56 <150 mg/dL   LDL Cholesterol (Calc) 132 (H) mg/dL (calc)   Total CHOL/HDL Ratio 4.3 <5.0 (calc)   Non-HDL Cholesterol (Calc) 147 (H) <130 mg/dL (calc)      Assessment & Plan:  1. Upper respiratory tract infection, unspecified type Acute x 7 days.  No signs of pneumonia today.  Treat with a few more days of supportive care - start Mucinex twice daily and Tessalon at night time as needed for cough.  If no improvement early next week, we can get chest x-ray.  In meantime, if symptoms worsen, return to clinic. With any sudden onset new chest pain, dizziness, sweating, or shortness of breath, go to ED.  - guaiFENesin (MUCINEX) 600 MG 12 hr tablet; Take 1 tablet (600 mg total) by mouth 2 (two) times daily as needed for cough or to loosen phlegm.  Dispense: 30 tablet; Refill: 0 - benzonatate (TESSALON) 100 MG capsule; Take 1 capsule (100 mg total) by mouth at bedtime as needed for cough. Take at night time and monitor for drowsiness.  Do not operate heavy machinery when taking this medication.  Dispense: 20 capsule; Refill: 0  2. Need for shingles vaccine Instructed to get shingles vaccine after fully recovered from current illness.  - Zoster Vaccine Adjuvanted [facility]) injection; Inject 0.5 mLs into the muscle once for 1 dose. Administer second dose 2-6 months after first dose.  Dispense: 1 each; Refill: 1   Follow up plan: Return if symptoms worsen or fail to improve.  This visit was completed via telephone due to the restrictions of the COVID-19 pandemic. All issues as  above were discussed and addressed but no physical exam was performed. If it was felt that the patient should be evaluated in the office, they were directed there. The patient verbally consented to this visit. Patient was unable to complete an audio/visual visit due to Technical difficulties. Location of the patient: home Location of the provider: work Those involved with this call:  Provider: Noemi Chapel, DNP, FNP-C CMA: n/a Front Desk/Registration: Santina Evans  Time spent on call:  13 minutes on the phone discussing health concerns. 15 minutes total spent in review of patient's record and preparation of their chart. I verified patient identity using two factors (patient name and date of birth). Patient consents verbally to being seen via telemedicine visit today.

## 2020-10-03 ENCOUNTER — Telehealth: Payer: Self-pay | Admitting: Pharmacist

## 2020-10-03 DIAGNOSIS — M79671 Pain in right foot: Secondary | ICD-10-CM | POA: Diagnosis not present

## 2020-10-03 DIAGNOSIS — M7752 Other enthesopathy of left foot: Secondary | ICD-10-CM | POA: Diagnosis not present

## 2020-10-03 DIAGNOSIS — M7731 Calcaneal spur, right foot: Secondary | ICD-10-CM | POA: Diagnosis not present

## 2020-10-03 DIAGNOSIS — M7751 Other enthesopathy of right foot: Secondary | ICD-10-CM | POA: Diagnosis not present

## 2020-10-03 DIAGNOSIS — M79672 Pain in left foot: Secondary | ICD-10-CM | POA: Diagnosis not present

## 2020-10-03 DIAGNOSIS — M7732 Calcaneal spur, left foot: Secondary | ICD-10-CM | POA: Diagnosis not present

## 2020-10-03 DIAGNOSIS — R3912 Poor urinary stream: Secondary | ICD-10-CM | POA: Diagnosis not present

## 2020-10-03 NOTE — Progress Notes (Addendum)
Chronic Care Management Pharmacy Assistant   Name: Patrick Villa  MRN: [id number] DOB: February 25, 1933  Reason for Encounter: Disease State For HTN.    Conditions to be addressed/monitored: hypertension, COPD, hyperlipidemia, vitamin D Deficiency.  Recent office visits:  09/18/20 Eulogio Bear, NP. For Upper respiratory tract infection. STARTED Guaifenesin 600 mg 2 times daily PRN, Benzonatate 100 mg at bedtime PRN.  07/15/20 Dr. Dennard Schaumann For allergic reaction. STARTED Valacyclovir 1000 mg 3 times daily for 7 days.  06/26/20 Eulogio Bear, NP. For follow-up. STOPPED Omeprazole.  04/30/20 Eulogio Bear, NP. For medicare wellness follow-up. STARTED Omeprazole 20 mg daily.  04/17/20 Eulogio Bear, NP. For right hip pain. STARTED Prednisone 40 mg daily.   Recent consult visits:  08/13/20 Cardiology Harrell Gave. Bridgette, MD. For follow-up. STOPPED Benazepril, Fluoromethalone. Ofloxacin and Prednisone. CHANGED Hydralazine to 25 mg 2 times daily.   Hospital visits:  None since 04/07/20  Medications: Outpatient Encounter Medications as of 10/03/2020  Medication Sig   aspirin EC 81 MG tablet Take 1 tablet (81 mg total) by mouth daily.   benzonatate (TESSALON) 100 MG capsule Take 1 capsule (100 mg total) by mouth at bedtime as needed for cough. Take at night time and monitor for drowsiness.  Do not operate heavy machinery when taking this medication.   chlorthalidone (HYGROTON) 25 MG tablet Take 1 tablet (25 mg total) by mouth daily.   Cholecalciferol (VITAMIN D) 2000 UNITS CAPS Take 1 capsule (2,000 Units total) by mouth daily.   diclofenac Sodium (VOLTAREN) 1 % GEL Apply 4 g topically 4 (four) times daily as needed.   finasteride (PROSCAR) 5 MG tablet TAKE 1 TABLET BY MOUTH  DAILY   guaiFENesin (MUCINEX) 600 MG 12 hr tablet Take 1 tablet (600 mg total) by mouth 2 (two) times daily as needed for cough or to loosen phlegm.   hydrALAZINE (APRESOLINE) 50 MG tablet Take 25 mg by  mouth in the morning and at bedtime.   terazosin (HYTRIN) 10 MG capsule TAKE 1 CAPSULE BY MOUTH  DAILY AT BEDTIME   triamcinolone cream (KENALOG) 0.1 % Apply 1 application topically 2 (two) times daily.   No facility-administered encounter medications on file as of 10/03/2020.   Reviewed chart prior to disease state call. Spoke with patient regarding BP  Recent Office Vitals: BP Readings from Last 3 Encounters:  08/13/20 (!) 142/72  07/15/20 (!) 148/88  06/26/20 (!) 164/92   Pulse Readings from Last 3 Encounters:  08/13/20 96  07/15/20 86  06/26/20 82    Wt Readings from Last 3 Encounters:  08/13/20 188 lb 3.2 oz (85.4 kg)  07/15/20 191 lb (86.6 kg)  06/26/20 191 lb 9.6 oz (86.9 kg)     Kidney Function Lab Results  Component Value Date/Time   CREATININE 1.20 (H) 04/30/2020 09:13 AM   CREATININE 1.05 03/11/2020 10:50 AM   CREATININE 1.20 (H) 07/23/2019 10:59 AM   GFRNONAA 55 (L) 04/30/2020 09:13 AM   GFRAA 64 04/30/2020 09:13 AM    BMP Latest Ref Rng & Units 04/30/2020 03/11/2020 07/23/2019  Glucose 65 - 99 mg/dL 103(H) 84 147(H)  BUN 7 - 25 mg/dL '21 21 15  '$ Creatinine 0.70 - 1.11 mg/dL 1.20(H) 1.05 1.20(H)  BUN/Creat Ratio 6 - 22 (calc) '18 20 13  '$ Sodium 135 - 146 mmol/L 141 143 140  Potassium 3.5 - 5.3 mmol/L 3.9 4.3 3.9  Chloride 98 - 110 mmol/L 104 100 105  CO2 20 - 32 mmol/L 27 26 28  Calcium 8.6 - 10.3 mg/dL 10.1 10.3(H) 9.9    Current antihypertensive regimen:  Hydralazine 50 mg three daily Terazosin 10 mg hs  How often are you checking your Blood Pressure? Patient stated 1-2x per week  Current home BP readings: Patient stated he does not have any readings for me at this time. He stated he is not writing them down.   What recent interventions/DTPs have been made by any provider to improve Blood Pressure control since last CPP Visit: None.  Any recent hospitalizations or ED visits since last visit with CPP? Patient stated no.  What diet changes have been made  to improve Blood Pressure Control?  Patient stated he eats a lot of vegetables an fruits. He stated he ensures he drinks plenty of water.  What exercise is being done to improve your Blood Pressure Control?  Patient stated he is active daily.  Adherence Review: Is the patient currently on ACE/ARB medication? N/A.  Does the patient have >5 day gap between last estimated fill dates? N/A.  Care Gaps: Not on the list.   Star Rating Drugs: N/A.  [place], RMA Clinical Pharmacist Assistant [phone]   10 minutes spent in review, coordination, and documentation.  Reviewed by: Beverly Milch, PharmD Clinical Pharmacist [phone]

## 2020-10-20 DIAGNOSIS — M722 Plantar fascial fibromatosis: Secondary | ICD-10-CM | POA: Diagnosis not present

## 2020-10-27 ENCOUNTER — Other Ambulatory Visit: Payer: Self-pay

## 2020-10-27 ENCOUNTER — Ambulatory Visit (INDEPENDENT_AMBULATORY_CARE_PROVIDER_SITE_OTHER): Payer: Medicare Other | Admitting: Nurse Practitioner

## 2020-10-27 ENCOUNTER — Encounter: Payer: Self-pay | Admitting: Nurse Practitioner

## 2020-10-27 VITALS — BP 138/70 | HR 90 | Temp 97.9°F | Ht 65.5 in | Wt 188.6 lb

## 2020-10-27 DIAGNOSIS — R21 Rash and other nonspecific skin eruption: Secondary | ICD-10-CM | POA: Diagnosis not present

## 2020-10-27 DIAGNOSIS — E7800 Pure hypercholesterolemia, unspecified: Secondary | ICD-10-CM | POA: Diagnosis not present

## 2020-10-27 DIAGNOSIS — I10 Essential (primary) hypertension: Secondary | ICD-10-CM | POA: Diagnosis not present

## 2020-10-27 DIAGNOSIS — B029 Zoster without complications: Secondary | ICD-10-CM | POA: Diagnosis not present

## 2020-10-27 DIAGNOSIS — R7303 Prediabetes: Secondary | ICD-10-CM | POA: Diagnosis not present

## 2020-10-27 DIAGNOSIS — E559 Vitamin D deficiency, unspecified: Secondary | ICD-10-CM

## 2020-10-27 DIAGNOSIS — N400 Enlarged prostate without lower urinary tract symptoms: Secondary | ICD-10-CM

## 2020-10-27 NOTE — Assessment & Plan Note (Signed)
Chronic.  Will recheck today and recommend treatment as indicated.

## 2020-10-27 NOTE — Progress Notes (Signed)
Subjective:    Patient ID: Patrick Villa, male    DOB: 1933-11-10, 87 y.o.   MRN: [id number]  HPI: Patrick Villa is a 87 y.o. male presenting for follow up.  Chief Complaint  Patient presents with   Follow-up   HYPERTENSION / HYPERLIPIDEMIA Currently taking chlorthalidone 25 mg daily, hydralazine 25 mg twice daily. BP monitoring frequency: daily BP range: 120s/70s Aspirin: yes Recent stressors: no Recurrent headaches: no Visual changes: no Palpitations: no Dyspnea: no Chest pain: no Lower extremity edema: no Dizzy/lightheaded: no Myalgias: no The ASCVD Risk score (Arnett DK, et al., 2019) failed to calculate for the following reasons:   The 2019 ASCVD risk score is only valid for ages 17 to 56  RASH Reports he feels the shingles rash is coming back.  He is wondering when he can get the shingles shot. Duration:  months  Location: trunk  Itching: yes Burning: yes Redness: no Oozing: no Scaling: no Blisters: yes Painful: no Fevers: no Change in detergents/soaps/personal care products: no Recent illness: no Recent travel:no History of same:  yes; has shingles a couple of months ago Context: better Alleviating factors: Triamcinolone cream Treatments attempted: triamcinolone cream  BPH Currently taking finasteride 5 mg daily and either silodosin 4 mg daily or terazosin 10 mg daily but he cannot remember which one he is taking.  He follows closely with Urology.  Allergies  Allergen Reactions   Amlodipine Other (See Comments)    Patient developed jitteriness, tremors and unstable gait.  Same issues when re-challenged   Lipitor [Atorvastatin Calcium]     Weakness.   Zocor [Simvastatin]    Bactrim [Sulfamethoxazole-Trimethoprim] Rash   Pravachol Hives and Rash    Outpatient Encounter Medications as of 10/27/2020  Medication Sig   aspirin EC 81 MG tablet Take 1 tablet (81 mg total) by mouth daily.   chlorthalidone (HYGROTON) 25 MG tablet Take 1 tablet (25 mg  total) by mouth daily.   Cholecalciferol (VITAMIN D) 2000 UNITS CAPS Take 1 capsule (2,000 Units total) by mouth daily.   diclofenac Sodium (VOLTAREN) 1 % GEL Apply 4 g topically 4 (four) times daily as needed.   finasteride (PROSCAR) 5 MG tablet TAKE 1 TABLET BY MOUTH  DAILY   hydrALAZINE (APRESOLINE) 50 MG tablet Take 25 mg by mouth in the morning and at bedtime.   silodosin (RAPAFLO) 4 MG CAPS capsule Take 4 mg by mouth daily.   terazosin (HYTRIN) 10 MG capsule TAKE 1 CAPSULE BY MOUTH  DAILY AT BEDTIME   triamcinolone cream (KENALOG) 0.1 % Apply 1 application topically 2 (two) times daily.   [DISCONTINUED] benzonatate (TESSALON) 100 MG capsule Take 1 capsule (100 mg total) by mouth at bedtime as needed for cough. Take at night time and monitor for drowsiness.  Do not operate heavy machinery when taking this medication.   [DISCONTINUED] guaiFENesin (MUCINEX) 600 MG 12 hr tablet Take 1 tablet (600 mg total) by mouth 2 (two) times daily as needed for cough or to loosen phlegm.   No facility-administered encounter medications on file as of 10/27/2020.    Patient Active Problem List   Diagnosis Date Noted   Pure hypercholesterolemia 10/27/2020   Prediabetes 10/27/2020   Bilateral foot pain 04/17/2020   ED (erectile dysfunction) 03/27/2019   BPH (benign prostatic hyperplasia) 05/23/2018   Second degree AV block, Mobitz type I 02/20/2018   Vitamin D deficiency 07/17/2014   Essential hypertension    COPD (chronic obstructive pulmonary disease) ([place])  Hyperlipidemia    Colon polyps    Systolic murmur    Ganglion cyst     Past Medical History:  Diagnosis Date   Colon polyps    COPD (chronic obstructive pulmonary disease) ([facility])    Diabetes mellitus without complication ([place]) 4/0/3474   Echocardiogram abnormal    Ganglion cyst    Hematuria    Hyperlipidemia    Hypertension    Systolic murmur     Relevant past medical, surgical, family and social history reviewed and updated as  indicated. Interim medical history since our last visit reviewed.  Review of Systems Per HPI unless specifically indicated above     Objective:    BP 138/70   Pulse 90   Temp 97.9 F (36.6 C) (Oral)   Ht 5' 5.5" (1.664 m)   Wt 188 lb 9.6 oz (85.5 kg)   SpO2 96%   BMI 30.91 kg/m   Wt Readings from Last 3 Encounters:  10/27/20 188 lb 9.6 oz (85.5 kg)  08/13/20 188 lb 3.2 oz (85.4 kg)  07/15/20 191 lb (86.6 kg)    Physical Exam Vitals and nursing note reviewed.  Constitutional:      General: He is not in acute distress.    Appearance: Normal appearance. He is not toxic-appearing.  HENT:     Head: Normocephalic and atraumatic.  Eyes:     General: No scleral icterus.    Extraocular Movements: Extraocular movements intact.  Cardiovascular:     Rate and Rhythm: Normal rate and regular rhythm.     Heart sounds: Murmur heard.  Pulmonary:     Effort: Pulmonary effort is normal. No respiratory distress.     Breath sounds: Normal breath sounds. No wheezing, rhonchi or rales.  Musculoskeletal:     Cervical back: Normal range of motion.     Right lower leg: No edema.     Left lower leg: No edema.  Lymphadenopathy:     Cervical: No cervical adenopathy.  Skin:    General: Skin is warm and dry.     Capillary Refill: Capillary refill takes less than 2 seconds.     Coloration: Skin is not jaundiced or pale.     Findings: Rash present. No erythema. Rash is crusting, papular, urticarial and vesicular.       Neurological:     Mental Status: He is alert and oriented to person, place, and time.     Motor: No weakness.     Gait: Gait normal.  Psychiatric:        Mood and Affect: Mood normal.        Behavior: Behavior normal.        Thought Content: Thought content normal.        Judgment: Judgment normal.      Assessment & Plan:   Problem List Items Addressed This Visit       Cardiovascular and Mediastinum   Essential hypertension - Primary    Chronic.  BP is better on  recheck today.  Continue collaboration with Cardiology and continue current medication.  Check kidney function with electrolytes and blood counts today.  Patient is not fasting. Follow up in 4 months.        Relevant Orders   COMPLETE METABOLIC PANEL WITH GFR   Lipid panel   CBC with Differential     Genitourinary   BPH (benign prostatic hyperplasia)    Chronic.  Will request records from Urologist and update medication list.  Continue collaboration.  Relevant Medications   silodosin (RAPAFLO) 4 MG CAPS capsule     Other   Vitamin D deficiency    Chronic.  Will recheck today and recommend treatment as indicated.      Relevant Orders   VITAMIN D 25 Hydroxy (Vit-D Deficiency, Fractures)   Pure hypercholesterolemia    Chronic.  Previously intolerant to statins and leery to try a new medication like Zetia, although we have discussed this is the past.  He has been eating a lot out of his garden, but does eat "sweets" occasionally.  Will check lipids today although patient is not fasting.      Relevant Orders   COMPLETE METABOLIC PANEL WITH GFR   Lipid panel   CBC with Differential   Prediabetes    Noted on last visit.  Will recheck HgbA1c today.  Goal A1c is less than 7.5% given age and comorbidities.  Discussed cutting back some on sweets/carbohydrates.  Follow up in 4 months.       Relevant Orders   Hemoglobin A1c   Other Visit Diagnoses     Rash       Herpes zoster without complication       Rash likely herpes given itching/burning.  Out of timeframe for oral therapy, can get shingles vaccine when rash resolves completely.  Continue topical steroid.        Follow up plan: Return in about 4 months (around 02/27/2021) for follow up.

## 2020-10-27 NOTE — Assessment & Plan Note (Signed)
Chronic.  Will request records from Urologist and update medication list.  Continue collaboration.

## 2020-10-27 NOTE — Assessment & Plan Note (Signed)
Chronic.  Previously intolerant to statins and leery to try a new medication like Zetia, although we have discussed this is the past.  He has been eating a lot out of his garden, but does eat "sweets" occasionally.  Will check lipids today although patient is not fasting.

## 2020-10-27 NOTE — Assessment & Plan Note (Signed)
Chronic.  BP is better on recheck today.  Continue collaboration with Cardiology and continue current medication.  Check kidney function with electrolytes and blood counts today.  Patient is not fasting. Follow up in 4 months.

## 2020-10-27 NOTE — Assessment & Plan Note (Addendum)
Noted on last visit.  Will recheck HgbA1c today.  Goal A1c is less than 7.5% given age and comorbidities.  Discussed cutting back some on sweets/carbohydrates.  Follow up in 4 months.

## 2020-10-28 LAB — CBC WITH DIFFERENTIAL/PLATELET
Absolute Monocytes: 520 cells/uL (ref 200–950)
Basophils Absolute: 39 cells/uL (ref 0–200)
Basophils Relative: 0.6 %
Eosinophils Absolute: 462 cells/uL (ref 15–500)
Eosinophils Relative: 7.1 %
HCT: 40.4 % (ref 38.5–50.0)
Hemoglobin: 13.1 g/dL — ABNORMAL LOW (ref 13.2–17.1)
Lymphs Abs: 2002 cells/uL (ref 850–3900)
MCH: 28.8 pg (ref 27.0–33.0)
MCHC: 32.4 g/dL (ref 32.0–36.0)
MCV: 88.8 fL (ref 80.0–100.0)
MPV: 10.5 fL (ref 7.5–12.5)
Monocytes Relative: 8 %
Neutro Abs: 3478 cells/uL (ref 1500–7800)
Neutrophils Relative %: 53.5 %
Platelets: 194 10*3/uL (ref 140–400)
RBC: 4.55 10*6/uL (ref 4.20–5.80)
RDW: 12.7 % (ref 11.0–15.0)
Total Lymphocyte: 30.8 %
WBC: 6.5 10*3/uL (ref 3.8–10.8)

## 2020-10-28 LAB — COMPLETE METABOLIC PANEL WITH GFR
AG Ratio: 1.5 (calc) (ref 1.0–2.5)
ALT: 23 U/L (ref 9–46)
AST: 20 U/L (ref 10–35)
Albumin: 4 g/dL (ref 3.6–5.1)
Alkaline phosphatase (APISO): 48 U/L (ref 35–144)
BUN/Creatinine Ratio: 15 (calc) (ref 6–22)
BUN: 18 mg/dL (ref 7–25)
CO2: 30 mmol/L (ref 20–32)
Calcium: 10.2 mg/dL (ref 8.6–10.3)
Chloride: 102 mmol/L (ref 98–110)
Creat: 1.23 mg/dL — ABNORMAL HIGH (ref 0.70–1.22)
Globulin: 2.7 g/dL (calc) (ref 1.9–3.7)
Glucose, Bld: 129 mg/dL — ABNORMAL HIGH (ref 65–99)
Potassium: 3.6 mmol/L (ref 3.5–5.3)
Sodium: 141 mmol/L (ref 135–146)
Total Bilirubin: 0.4 mg/dL (ref 0.2–1.2)
Total Protein: 6.7 g/dL (ref 6.1–8.1)
eGFR: 58 mL/min/{1.73_m2} — ABNORMAL LOW (ref >=60)

## 2020-10-28 LAB — LIPID PANEL
Cholesterol: 203 mg/dL — ABNORMAL HIGH (ref <200)
HDL: 44 mg/dL (ref >=40)
LDL Cholesterol (Calc): 133 mg/dL (calc) — ABNORMAL HIGH
Non-HDL Cholesterol (Calc): 159 mg/dL (calc) — ABNORMAL HIGH (ref <130)
Total CHOL/HDL Ratio: 4.6 (calc) (ref <5.0)
Triglycerides: 138 mg/dL (ref <150)

## 2020-10-28 LAB — VITAMIN D 25 HYDROXY (VIT D DEFICIENCY, FRACTURES): Vit D, 25-Hydroxy: 46 ng/mL (ref 30–100)

## 2020-10-28 LAB — HEMOGLOBIN A1C
Hgb A1c MFr Bld: 6.3 % of total Hgb — ABNORMAL HIGH (ref <5.7)
Mean Plasma Glucose: 134 mg/dL
eAG (mmol/L): 7.4 mmol/L

## 2020-10-29 ENCOUNTER — Telehealth: Payer: Self-pay | Admitting: Nurse Practitioner

## 2020-10-29 NOTE — Telephone Encounter (Signed)
Noted  

## 2020-10-29 NOTE — Telephone Encounter (Signed)
Pt returned the call to get his lab results.

## 2020-10-30 ENCOUNTER — Other Ambulatory Visit: Payer: Self-pay | Admitting: *Deleted

## 2020-10-30 ENCOUNTER — Telehealth: Payer: Self-pay | Admitting: Nurse Practitioner

## 2020-10-30 MED ORDER — TRIAMCINOLONE ACETONIDE 0.1 % EX CREA: 1.0000 "application " | TOPICAL_CREAM | Freq: Two times a day (BID) | CUTANEOUS | 1 refills | Status: DC

## 2020-10-30 MED ORDER — EZETIMIBE 10 MG PO TABS: 10.0000 mg | ORAL_TABLET | Freq: Every day | ORAL | 3 refills | Status: DC

## 2020-10-30 NOTE — Telephone Encounter (Signed)
Call placed to patient and discussed.   Please see lab resutls.

## 2020-10-30 NOTE — Telephone Encounter (Signed)
Patient returned nurse's call; stated they keep missing each other. Please advise at [phone], or on his cell number of [phone].

## 2020-11-04 DIAGNOSIS — H26493 Other secondary cataract, bilateral: Secondary | ICD-10-CM | POA: Diagnosis not present

## 2020-11-05 DIAGNOSIS — R3912 Poor urinary stream: Secondary | ICD-10-CM | POA: Diagnosis not present

## 2020-11-18 ENCOUNTER — Other Ambulatory Visit: Payer: Self-pay

## 2020-11-18 ENCOUNTER — Encounter (HOSPITAL_BASED_OUTPATIENT_CLINIC_OR_DEPARTMENT_OTHER): Payer: Self-pay | Admitting: Cardiology

## 2020-11-18 ENCOUNTER — Ambulatory Visit (HOSPITAL_BASED_OUTPATIENT_CLINIC_OR_DEPARTMENT_OTHER): Payer: Medicare Other | Admitting: Cardiology

## 2020-11-18 VITALS — BP 146/82 | HR 86 | Ht 66.0 in | Wt 191.4 lb

## 2020-11-18 DIAGNOSIS — R0789 Other chest pain: Secondary | ICD-10-CM

## 2020-11-18 DIAGNOSIS — E7800 Pure hypercholesterolemia, unspecified: Secondary | ICD-10-CM

## 2020-11-18 DIAGNOSIS — Z7189 Other specified counseling: Secondary | ICD-10-CM | POA: Diagnosis not present

## 2020-11-18 DIAGNOSIS — I10 Essential (primary) hypertension: Secondary | ICD-10-CM

## 2020-11-18 DIAGNOSIS — I441 Atrioventricular block, second degree: Secondary | ICD-10-CM

## 2020-11-18 DIAGNOSIS — R011 Cardiac murmur, unspecified: Secondary | ICD-10-CM | POA: Diagnosis not present

## 2020-11-18 NOTE — Progress Notes (Signed)
Cardiology Office Note:    Date:  11/19/2020   ID:  Patrick Villa, DOB 07/04/1933, MRN [id number]  PCP:  Eulogio Bear, NP  Cardiologist:  Buford Dresser, MD PhD  Referring MD: Eulogio Bear, NP   CC: follow up of hypertension  History of Present Illness:    Patrick Villa is a 87 y.o. male with a hx of COPD, hyperlipidemia, hypertension, second degree AV block type I who is seen in follow up today. I initially saw him as a new consult 01/2018 at the request of Eulogio Bear, NP for the evaluation and management of second degree, type I heart block.   He was seen in the hospital on 01/27/18 and 01/28/18 by Drs. Fredrik Rigger for bilateral breast tenderness. At that time, troponins were negative, echo was unremarkable. Cardizem was stopped due to second degree AV block, type I.   Current regimen: AM:  25 mg Hydralazine, Vitamin D, Finasteride PM:  25 mg Hydralazine, 25 mg Chorthalidone, Terazosin  Today: Overall he is feeling good. At home he is seeing a normal range of 350-093 systolic. This morning his blood pressure was 137/66. Sometimes he has noticed higher readings in the mornings.  Every now and then, he will have chest pain with very strenuous work, but it still is not as severe as it was initially. His pain has been managed well with applying triamcinolone cream.  Once while loading wood into the stove, he needed to pause due to positional lightheadedness. He stood still for a few minutes until this subsided.  He remains compliant and tolerant of his medications.  He denies any palpitations, headaches, syncope, orthopnea, PND, or lower extremity edema.  Past Medical History:  Diagnosis Date   Colon polyps    COPD (chronic obstructive pulmonary disease) ([place])    Diabetes mellitus without complication ([place]) 08/25/8297   Echocardiogram abnormal    Ganglion cyst    Hematuria    Hyperlipidemia    Hypertension    Systolic murmur     Past Surgical  History:  Procedure Laterality Date   COLON SURGERY     TRANSTHORACIC ECHOCARDIOGRAM      Current Medications: Current Outpatient Medications on File Prior to Visit  Medication Sig   aspirin EC 81 MG tablet Take 1 tablet (81 mg total) by mouth daily.   chlorthalidone (HYGROTON) 25 MG tablet Take 1 tablet (25 mg total) by mouth daily.   Cholecalciferol (VITAMIN D) 2000 UNITS CAPS Take 1 capsule (2,000 Units total) by mouth daily.   diclofenac Sodium (VOLTAREN) 1 % GEL Apply 4 g topically 4 (four) times daily as needed.   ezetimibe (ZETIA) 10 MG tablet Take 1 tablet (10 mg total) by mouth daily.   finasteride (PROSCAR) 5 MG tablet TAKE 1 TABLET BY MOUTH  DAILY   hydrALAZINE (APRESOLINE) 50 MG tablet Take 25 mg by mouth in the morning and at bedtime.   silodosin (RAPAFLO) 4 MG CAPS capsule Take 4 mg by mouth daily.   terazosin (HYTRIN) 10 MG capsule TAKE 1 CAPSULE BY MOUTH  DAILY AT BEDTIME   triamcinolone cream (KENALOG) 0.1 % Apply 1 application topically 2 (two) times daily.   No current facility-administered medications on file prior to visit.     Allergies:   Amlodipine, Lipitor [atorvastatin calcium], Zocor [simvastatin], Bactrim [sulfamethoxazole-trimethoprim], and Pravachol   Social History   Tobacco Use   Smoking status: Former    Types: Cigarettes    Quit date: 10/18/1981  Years since quitting: 39.1   Smokeless tobacco: Never  Vaping Use   Vaping Use: Never used  Substance Use Topics   Alcohol use: No   Drug use: No    Family History: The patient's family history includes Diabetes in his mother; Stroke in his brother.  ROS:   Please see the history of present illness.   (+) Chest pain (+) Lightheadedness Additional pertinent ROS negative except as documented.    EKGs/Labs/Other Studies Reviewed:    The following studies were reviewed today:  [place] 02/06/2019: Nuclear stress EF: 48%. The left ventricular ejection fraction is mildly  decreased (45-54%). No T wave inversion was noted during stress. There was no ST segment deviation noted during stress. This is an intermediate risk study. No ischemia. LVEF 48% with dilated LV and global hypokinesis. This is an intermediate risk study.  Echo 01/28/18 - Left ventricle: The cavity size was normal. Wall thickness was   normal. Systolic function was normal. The estimated ejection   fraction was in the range of 55% to 60%. Wall motion was normal;   there were no regional wall motion abnormalities. The study is   not technically sufficient to allow evaluation of LV diastolic   function. - Atrial septum: There was an atrial septal aneurysm.   Impressions: - Normal LV function; sclerotic aortic valve.  EKG:  EKG is personally reviewed.    11/18/2020: not ordered today 08/13/2020: not ordered today 03/11/2020: sinus rhythm, 2nd degree AV block type 1, LAFB, PRWP. Compared to prior ECG, there is now nonspecific ST-T pattern in V3-V6  Recent Labs: 10/27/2020: ALT 23; BUN 18; Creat 1.23; Hemoglobin 13.1; Platelets 194; Potassium 3.6; Sodium 141  Recent Lipid Panel    Component Value Date/Time   CHOL 203 (H) 10/27/2020 0840   TRIG 138 10/27/2020 0840   HDL 44 10/27/2020 0840   CHOLHDL 4.6 10/27/2020 0840   VLDL 18 01/28/2018 0538   LDLCALC 133 (H) 10/27/2020 0840    Physical Exam:    VS:  BP (!) 146/82 (BP Location: Right Arm, Patient Position: Sitting, Cuff Size: Normal)   Pulse 86   Ht 5\' 6"  (1.676 m)   Wt 191 lb 6.4 oz (86.8 kg)   SpO2 97%   BMI 30.89 kg/m     Wt Readings from Last 3 Encounters:  11/18/20 191 lb 6.4 oz (86.8 kg)  10/27/20 188 lb 9.6 oz (85.5 kg)  08/13/20 188 lb 3.2 oz (85.4 kg)    GEN: Well nourished, well developed in no acute distress HEENT: Normal, moist mucous membranes NECK: No JVD CARDIAC: regular rhythm, normal S1 and S2, no rubs or gallops. 1/6 systolic murmur. VASCULAR: Radial and DP pulses 2+ bilaterally. No carotid  bruits RESPIRATORY:  Clear to auscultation without rales, wheezing or rhonchi  ABDOMEN: Soft, non-tender, non-distended MUSCULOSKELETAL:  Ambulates independently SKIN: Warm and dry, no edema NEUROLOGIC:  Alert and oriented x 3. No focal neuro deficits noted. PSYCHIATRIC:  Normal affect    ASSESSMENT:    1. Essential hypertension   2. Pure hypercholesterolemia   3. Second degree AV block, Mobitz type I   4. Cardiac risk counseling   5. Systolic murmur      PLAN:    Hypertension: -control much improved based on home logs -continue hydralazine, chlorthalidone, terazosin/finasteride -this has been a long term process, as there are medications he has not tolerated and some that do not appear to affect his blood pressure. He is close to goal but  not yet there. Given prior intolerances, will not make changes at this time  History of antihypertensives -tried amlodipine, had jitteriness/tremors. Had chest wall pain on spironolactone.  -no diltiazem/verapamil, beta blocker due to second degree AV block type 1 ([place]) -had aches/balance issues on chlorthalidone, but now tolerating -had worsening chest wall pain on >50 mg hydralazine  Aspirin use: reviewed indications/recommendations, but he wishes to continue.  Murmur: aortic sclerosis without stenosis, no further workup needed. Echo 01/2018  History of chest discomfort/tenderness: longstanding, atypical. Lexiscan normal. Mammogram normal.  Second degree AV block, type I: no indication for pacemaker  CV risk counseling and prevention recommendations -recommend heart healthy/Mediterranean diet, with whole grains, fruits, vegetable, fish, lean meats, nuts, and olive oil. Limit salt. -recommend moderate walking, 3-5 times/week for 30-50 minutes each session. Aim for at least 150 minutes.week. Goal should be pace of 3 miles/hours, or walking 1.5 miles in 30 minutes -recommend avoidance of tobacco products. Avoid excess alcohol. -ASCVD  risk score: The ASCVD Risk score (Arnett DK, et al., 2019) failed to calculate for the following reasons:   The 2019 ASCVD risk score is only valid for ages 23 to 37   Plan for follow up: 6 mos or sooner as needed  Buford Dresser, MD, PhD [place]  [facility]   Medication Adjustments/Labs and Tests Ordered: Current medicines are reviewed at length with the patient today.  Concerns regarding medicines are outlined above.  No orders of the defined types were placed in this encounter.  No orders of the defined types were placed in this encounter.   Patient Instructions  Medication Instructions:  No changes *If you need a refill on your cardiac medications before your next appointment, please call your pharmacy*   Lab Work: None Today  Testing/Procedures: None today   Follow-Up: At [facility], you and your health needs are our priority.  As part of our continuing mission to provide you with exceptional heart care, we have created designated Provider Care Teams.  These Care Teams include your primary Cardiologist (physician) and Advanced Practice Providers (APPs -  Physician Assistants and Nurse Practitioners) who all work together to provide you with the care you need, when you need it.  We recommend signing up for the patient portal called "MyChart".  Sign up information is provided on this After Visit Summary.  MyChart is used to connect with patients for Virtual Visits (Telemedicine).  Patients are able to view lab/test results, encounter notes, upcoming appointments, etc.  Non-urgent messages can be sent to your provider as well.   To learn more about what you can do with MyChart, go to [email or web address].    Your next appointment:   6 month(s)  The format for your next appointment:   In Person  Provider:   Buford Dresser, MD   Other Instructions None today   I,Mathew Stumpf,acting as a scribe for Buford Dresser, MD.,have  documented all relevant documentation on the behalf of Buford Dresser, MD,as directed by  Buford Dresser, MD while in the presence of Buford Dresser, MD.  I, Buford Dresser, MD, have reviewed all documentation for this visit. The documentation on 11/19/20 for the exam, diagnosis, procedures, and orders are all accurate and complete.   Signed, Buford Dresser, MD PhD 11/19/2020    [facility]

## 2020-11-18 NOTE — Patient Instructions (Signed)
Medication Instructions:  No changes *If you need a refill on your cardiac medications before your next appointment, please call your pharmacy*   Lab Work: None Today  Testing/Procedures: None today   Follow-Up: At [facility], you and your health needs are our priority.  As part of our continuing mission to provide you with exceptional heart care, we have created designated Provider Care Teams.  These Care Teams include your primary Cardiologist (physician) and Advanced Practice Providers (APPs -  Physician Assistants and Nurse Practitioners) who all work together to provide you with the care you need, when you need it.  We recommend signing up for the patient portal called "MyChart".  Sign up information is provided on this After Visit Summary.  MyChart is used to connect with patients for Virtual Visits (Telemedicine).  Patients are able to view lab/test results, encounter notes, upcoming appointments, etc.  Non-urgent messages can be sent to your provider as well.   To learn more about what you can do with MyChart, go to [email or web address].    Your next appointment:   6 month(s)  The format for your next appointment:   In Person  Provider:   Buford Dresser, MD   Other Instructions None today

## 2020-11-19 ENCOUNTER — Encounter (HOSPITAL_BASED_OUTPATIENT_CLINIC_OR_DEPARTMENT_OTHER): Payer: Self-pay | Admitting: Cardiology

## 2021-01-12 ENCOUNTER — Other Ambulatory Visit: Payer: Self-pay | Admitting: Cardiology

## 2021-01-12 DIAGNOSIS — I10 Essential (primary) hypertension: Secondary | ICD-10-CM

## 2021-01-30 DIAGNOSIS — M71571 Other bursitis, not elsewhere classified, right ankle and foot: Secondary | ICD-10-CM | POA: Diagnosis not present

## 2021-01-30 DIAGNOSIS — M722 Plantar fascial fibromatosis: Secondary | ICD-10-CM | POA: Diagnosis not present

## 2021-02-02 ENCOUNTER — Other Ambulatory Visit: Payer: Self-pay | Admitting: Cardiology

## 2021-02-13 DIAGNOSIS — M722 Plantar fascial fibromatosis: Secondary | ICD-10-CM | POA: Diagnosis not present

## 2021-02-13 DIAGNOSIS — G5753 Tarsal tunnel syndrome, bilateral lower limbs: Secondary | ICD-10-CM | POA: Diagnosis not present

## 2021-02-25 ENCOUNTER — Other Ambulatory Visit: Payer: Self-pay

## 2021-02-25 ENCOUNTER — Other Ambulatory Visit: Payer: Self-pay | Admitting: Nurse Practitioner

## 2021-02-25 MED ORDER — TERAZOSIN HCL 10 MG PO CAPS: 10.0000 mg | ORAL_CAPSULE | Freq: Every day | ORAL | 0 refills | Status: DC

## 2021-02-25 MED ORDER — FINASTERIDE 5 MG PO TABS: 5.0000 mg | ORAL_TABLET | Freq: Every day | ORAL | 0 refills | Status: DC

## 2021-02-27 ENCOUNTER — Other Ambulatory Visit: Payer: Self-pay

## 2021-02-27 ENCOUNTER — Ambulatory Visit (INDEPENDENT_AMBULATORY_CARE_PROVIDER_SITE_OTHER): Payer: Medicare Other | Admitting: Nurse Practitioner

## 2021-02-27 ENCOUNTER — Other Ambulatory Visit: Payer: Self-pay | Admitting: Nurse Practitioner

## 2021-02-27 ENCOUNTER — Encounter: Payer: Self-pay | Admitting: Nurse Practitioner

## 2021-02-27 VITALS — BP 172/80 | HR 90 | Ht 66.0 in | Wt 197.0 lb

## 2021-02-27 DIAGNOSIS — I10 Essential (primary) hypertension: Secondary | ICD-10-CM

## 2021-02-27 DIAGNOSIS — R7303 Prediabetes: Secondary | ICD-10-CM | POA: Diagnosis not present

## 2021-02-27 DIAGNOSIS — E7800 Pure hypercholesterolemia, unspecified: Secondary | ICD-10-CM

## 2021-02-27 DIAGNOSIS — R1013 Epigastric pain: Secondary | ICD-10-CM

## 2021-02-27 DIAGNOSIS — Z8619 Personal history of other infectious and parasitic diseases: Secondary | ICD-10-CM

## 2021-02-27 MED ORDER — PANTOPRAZOLE SODIUM 40 MG PO TBEC: 40.0000 mg | DELAYED_RELEASE_TABLET | Freq: Every day | ORAL | 1 refills | Status: DC

## 2021-02-27 MED ORDER — SUCRALFATE 1 G PO TABS
1.0000 g | ORAL_TABLET | Freq: Three times a day (TID) | ORAL | 1 refills | Status: DC
Start: 2021-02-27 — End: 2021-02-27

## 2021-02-27 NOTE — Progress Notes (Signed)
Subjective:    Patient ID: Patrick Villa, male    DOB: 02-21-1933, 88 y.o.   MRN: [id number]  HPI: Patrick Villa is a 88 y.o. male presenting for follow up and ongoing abdominal pain.  Chief Complaint  Patient presents with   Hypertension   He is not fasting today.   ABDOMINAL PAIN  Has tried acid pills and have not helped.  Sometimes if he eats (potatoes, cereal with milk) Duration: months Onset: constant Severity: Quality: burning across chest Location:  epigastric  Episode duration: constant; sometimes better after eating Radiation: no Frequency: Alleviating factors:  Aggravating factors: Status: Treatments attempted: Fever: no Body aches: no Chills: no Nausea: no Vomiting: no Weight loss: no Decreased appetite: no Diarrhea: no Constipation: no Blood in stool: no Heartburn: no Jaundice: no Rash: no Dysuria/urinary frequency: no Hematuria: no Recurrent NSAID use: yes   HYPERTENSION / HYPERLIPIDEMIA Currently taking chlorthalidone 25 mg daily, finasteride 5 mg daily, hydralazine 50 mg three times daily, and terazosin 10 mg daily at bedtime for blood pressure/BPH.  Also taking Zetia and tolerating this well.  Has been intolerant to statins in the past.  BP monitoring frequency: daily Home BP: 120s/70s Aspirin: yes Recent stressors: no Recurrent headaches: no Visual changes: no Palpitations: no Dyspnea: no Chest pain: no Lower extremity edema: no Dizzy/lightheaded: no Myalgias: no LDL goal: less than 100  BP goal: less than 140/90 The ASCVD Risk score (Arnett DK, et al., 2019) failed to calculate for the following reasons:   The 2019 ASCVD risk score is only valid for ages 42 to 41  History of prediabetes - last A1c in October 2022 6.3%.  Denies hypoglycemic episodes, polyuria/polydipsia, vision changes, numbness/tingling in fingers/toes.    Also reports that a very specific area on his upper left shoulder blade is very itchy at night. He is  wondering if it is a spot that needs to be removed.  Allergies  Allergen Reactions   Amlodipine Other (See Comments)    Patient developed jitteriness, tremors and unstable gait.  Same issues when re-challenged   Lipitor [Atorvastatin Calcium]     Weakness.   Zocor [Simvastatin]    Bactrim [Sulfamethoxazole-Trimethoprim] Rash   Pravachol Hives and Rash    Outpatient Encounter Medications as of 02/27/2021  Medication Sig   chlorthalidone (HYGROTON) 25 MG tablet TAKE 1 TABLET BY MOUTH  DAILY   Cholecalciferol (VITAMIN D) 2000 UNITS CAPS Take 1 capsule (2,000 Units total) by mouth daily.   diclofenac Sodium (VOLTAREN) 1 % GEL Apply 4 g topically 4 (four) times daily as needed.   ezetimibe (ZETIA) 10 MG tablet Take 1 tablet (10 mg total) by mouth daily.   finasteride (PROSCAR) 5 MG tablet Take 1 tablet (5 mg total) by mouth daily.   hydrALAZINE (APRESOLINE) 50 MG tablet TAKE 1 TABLET BY MOUTH 3  TIMES DAILY   pantoprazole (PROTONIX) 40 MG tablet Take 1 tablet (40 mg total) by mouth daily.   sucralfate (CARAFATE) 1 g tablet Take 1 tablet (1 g total) by mouth 4 (four) times daily -  with meals and at bedtime.   terazosin (HYTRIN) 10 MG capsule Take 1 capsule (10 mg total) by mouth at bedtime.   triamcinolone cream (KENALOG) 0.1 % Apply 1 application topically 2 (two) times daily.   [DISCONTINUED] aspirin EC 81 MG tablet Take 1 tablet (81 mg total) by mouth daily.   [DISCONTINUED] OMEPRAZOLE PO Take by mouth.   No facility-administered encounter medications on file as  of 02/27/2021.    Patient Active Problem List   Diagnosis Date Noted   Pure hypercholesterolemia 10/27/2020   Prediabetes 10/27/2020   Bilateral foot pain 04/17/2020   ED (erectile dysfunction) 03/27/2019   BPH (benign prostatic hyperplasia) 05/23/2018   Second degree AV block, Mobitz type I 02/20/2018   Vitamin D deficiency 07/17/2014   Essential hypertension    COPD (chronic obstructive pulmonary disease) ([facility])     Hyperlipidemia    Colon polyps    Systolic murmur    Ganglion cyst     Past Medical History:  Diagnosis Date   Colon polyps    COPD (chronic obstructive pulmonary disease) ([place])    Diabetes mellitus without complication ([place]) 07/28/5327   Echocardiogram abnormal    Ganglion cyst    Hematuria    Hyperlipidemia    Hypertension    Systolic murmur     Relevant past medical, surgical, family and social history reviewed and updated as indicated. Interim medical history since our last visit reviewed.  Review of Systems Per HPI unless specifically indicated above     Objective:    BP (!) 172/80    Pulse 90    Ht 5\' 6"  (1.676 m)    Wt 197 lb (89.4 kg)    SpO2 97%    BMI 31.80 kg/m   Wt Readings from Last 3 Encounters:  02/27/21 197 lb (89.4 kg)  11/18/20 191 lb 6.4 oz (86.8 kg)  10/27/20 188 lb 9.6 oz (85.5 kg)    Physical Exam Vitals and nursing note reviewed.  Constitutional:      General: He is not in acute distress.    Appearance: Normal appearance. He is not toxic-appearing.  HENT:     Head: Normocephalic and atraumatic.  Eyes:     General: No scleral icterus.    Extraocular Movements: Extraocular movements intact.  Cardiovascular:     Rate and Rhythm: Normal rate and regular rhythm.     Heart sounds: Murmur heard.  Pulmonary:     Effort: Pulmonary effort is normal. No respiratory distress.     Breath sounds: Normal breath sounds. No wheezing, rhonchi or rales.  Abdominal:     General: Abdomen is flat. Bowel sounds are normal. There is no distension.     Palpations: Abdomen is soft. There is no mass.     Tenderness: There is no abdominal tenderness.     Hernia: No hernia is present.  Musculoskeletal:     Cervical back: Normal range of motion.     Right lower leg: No edema.     Left lower leg: No edema.  Lymphadenopathy:     Cervical: No cervical adenopathy.  Skin:    General: Skin is warm and dry.     Capillary Refill: Capillary refill takes less than 2  seconds.     Coloration: Skin is not jaundiced or pale.     Findings: No erythema or rash.          Comments: Scabbed over excoriated mark noted to upper left back  Neurological:     Mental Status: He is alert and oriented to person, place, and time.     Motor: No weakness.     Gait: Gait normal.  Psychiatric:        Mood and Affect: Mood normal.        Behavior: Behavior normal.        Thought Content: Thought content normal.        Judgment: Judgment normal.  Assessment & Plan:  1. Essential hypertension Chronic.  BP is elevated today in office, reports numbers at goal at home.  Also follows closely with Cardiology; continue collaboration.  Continue current medications.   2. Pure hypercholesterolemia Chronic . Started Zetia 10 mg daily after last visit and seems to be tolerating well.  Check nonfasting lipid panel today and electrolytes, liver enzymes.  Continue Zetia.  - Lipid panel - COMPLETE METABOLIC PANEL WITH GFR  3. Prediabetes Chronic.  Check HgbA1c today for monitoring.  - Hemoglobin A1c  4. Epigastric burning sensation Acute x months.  Suspect duodenal ulcer as symptoms seem to improve with eating.  Start daily PPI and Carafate four times daily.  Follow up with PCP as planned later this month.  Consider referral to GI if this does not help symptoms.    - sucralfate (CARAFATE) 1 g tablet; Take 1 tablet (1 g total) by mouth 4 (four) times daily -  with meals and at bedtime.  Dispense: 120 tablet; Refill: 1 - pantoprazole (PROTONIX) 40 MG tablet; Take 1 tablet (40 mg total) by mouth daily.  Dispense: 60 tablet; Refill: 1   5. History of shingles I do not appreciate any major abnormality around the area the patient shows me today on his upper left back.  I suspect neuropathy related to history of herpes zoster in the same area.  Briefly discussed use of gabapentin, however patient is not interested in more medication at this time.  Follow up plan: Return for with  new PCP.

## 2021-02-28 LAB — COMPLETE METABOLIC PANEL WITH GFR
AG Ratio: 1.5 (calc) (ref 1.0–2.5)
ALT: 19 U/L (ref 9–46)
AST: 21 U/L (ref 10–35)
Albumin: 4.3 g/dL (ref 3.6–5.1)
Alkaline phosphatase (APISO): 46 U/L (ref 35–144)
BUN/Creatinine Ratio: 15 (calc) (ref 6–22)
BUN: 22 mg/dL (ref 7–25)
CO2: 30 mmol/L (ref 20–32)
Calcium: 10.6 mg/dL — ABNORMAL HIGH (ref 8.6–10.3)
Chloride: 103 mmol/L (ref 98–110)
Creat: 1.51 mg/dL — ABNORMAL HIGH (ref 0.70–1.22)
Globulin: 2.9 g/dL (calc) (ref 1.9–3.7)
Glucose, Bld: 93 mg/dL (ref 65–99)
Potassium: 3.9 mmol/L (ref 3.5–5.3)
Sodium: 142 mmol/L (ref 135–146)
Total Bilirubin: 0.5 mg/dL (ref 0.2–1.2)
Total Protein: 7.2 g/dL (ref 6.1–8.1)
eGFR: 45 mL/min/{1.73_m2} — ABNORMAL LOW (ref >=60)

## 2021-02-28 LAB — LIPID PANEL
Cholesterol: 200 mg/dL — ABNORMAL HIGH (ref <200)
HDL: 50 mg/dL (ref >=40)
LDL Cholesterol (Calc): 130 mg/dL (calc) — ABNORMAL HIGH
Non-HDL Cholesterol (Calc): 150 mg/dL (calc) — ABNORMAL HIGH (ref <130)
Total CHOL/HDL Ratio: 4 (calc) (ref <5.0)
Triglycerides: 101 mg/dL (ref <150)

## 2021-02-28 LAB — HEMOGLOBIN A1C
Hgb A1c MFr Bld: 6.4 % of total Hgb — ABNORMAL HIGH (ref <5.7)
Mean Plasma Glucose: 137 mg/dL
eAG (mmol/L): 7.6 mmol/L

## 2021-03-20 DIAGNOSIS — Z8619 Personal history of other infectious and parasitic diseases: Secondary | ICD-10-CM | POA: Diagnosis not present

## 2021-03-20 DIAGNOSIS — I441 Atrioventricular block, second degree: Secondary | ICD-10-CM | POA: Diagnosis not present

## 2021-03-20 DIAGNOSIS — N1832 Chronic kidney disease, stage 3b: Secondary | ICD-10-CM | POA: Diagnosis not present

## 2021-03-20 DIAGNOSIS — R7303 Prediabetes: Secondary | ICD-10-CM | POA: Diagnosis not present

## 2021-03-20 DIAGNOSIS — E7800 Pure hypercholesterolemia, unspecified: Secondary | ICD-10-CM | POA: Diagnosis not present

## 2021-03-20 DIAGNOSIS — I10 Essential (primary) hypertension: Secondary | ICD-10-CM | POA: Diagnosis not present

## 2021-05-01 ENCOUNTER — Other Ambulatory Visit: Payer: Self-pay | Admitting: Nurse Practitioner

## 2021-05-04 ENCOUNTER — Other Ambulatory Visit: Payer: Self-pay

## 2021-05-17 ENCOUNTER — Other Ambulatory Visit: Payer: Self-pay | Admitting: Nurse Practitioner

## 2021-05-26 ENCOUNTER — Ambulatory Visit (HOSPITAL_BASED_OUTPATIENT_CLINIC_OR_DEPARTMENT_OTHER): Payer: Medicare Other | Admitting: Cardiology

## 2021-05-26 ENCOUNTER — Encounter (HOSPITAL_BASED_OUTPATIENT_CLINIC_OR_DEPARTMENT_OTHER): Payer: Self-pay | Admitting: Cardiology

## 2021-05-26 VITALS — BP 168/76 | HR 84 | Ht 66.0 in | Wt 193.0 lb

## 2021-05-26 DIAGNOSIS — I441 Atrioventricular block, second degree: Secondary | ICD-10-CM | POA: Diagnosis not present

## 2021-05-26 DIAGNOSIS — E7800 Pure hypercholesterolemia, unspecified: Secondary | ICD-10-CM | POA: Diagnosis not present

## 2021-05-26 DIAGNOSIS — R011 Cardiac murmur, unspecified: Secondary | ICD-10-CM

## 2021-05-26 DIAGNOSIS — Z79899 Other long term (current) drug therapy: Secondary | ICD-10-CM

## 2021-05-26 DIAGNOSIS — I10 Essential (primary) hypertension: Secondary | ICD-10-CM | POA: Diagnosis not present

## 2021-05-26 NOTE — Progress Notes (Signed)
?Cardiology Office Note:   ? ?Date:  05/26/2021  ? ?ID:  Patrick Villa, DOB 1933-06-29, MRN [id number] ? ?PCP:  Mckinley Jewel, MD  ?Cardiologist:  Buford Dresser, MD PhD ? ?CC: follow up of hypertension ? ?History of Present Illness:   ? ?Patrick Villa is a 88 y.o. male with a hx of COPD, hyperlipidemia, hypertension, second degree AV block type I who is seen in follow up today. I initially saw him as a new consult 01/2018 for the evaluation and management of second degree, type I heart block.  ? ?He was seen in the hospital on 01/27/18 and 01/28/18 by Drs. Fredrik Rigger for bilateral breast tenderness. At that time, troponins were negative, echo was unremarkable. Cardizem was stopped due to second degree AV block, type I.  ? ?Current regimen: ?AM:  25 mg Hydralazine, Vitamin D, Finasteride ?PM:  25 mg Hydralazine, 25 mg Chorthalidone, Terazosin ? ?Today: ?In the morning, feels lightheaded, like he might fall over. Checks his blood pressure, typically 140s-150s. No lows. Worse when he leans forward. ? ?Has labile BP at home. Ranges 782-956 systolic.  ? ?Has taken chlorthalidone, hydralazine, and finasteride this AM. Takes terazosin and hydralazine at nighttime. Doesn't take mid-day. Decreased to 25 mg dose of hydralazine as the 50 mg dose makes his chest sore. ? ?Gets up around 5:30 AM. Has already done 2 hours of yardwork today.  ? ?Started on sucralfate and PPI in 02/2021, this helped some but he notes better relief with Pepto-Bismol. Denies melena or hematochezia. He notes that he is worried about cancer of the stomach. No decrease in appetite ("I eat like a hog") and no weight loss. ? ?Denies shortness of breath at rest or with normal exertion. No PND, orthopnea, LE edema or unexpected weight gain. No syncope or palpitations.  ? ?Past Medical History:  ?Diagnosis Date  ? Colon polyps   ? COPD (chronic obstructive pulmonary disease) ([place])   ? Diabetes mellitus without complication ([place]) 02/26/3084  ?  Echocardiogram abnormal   ? Ganglion cyst   ? Hematuria   ? Hyperlipidemia   ? Hypertension   ? Systolic murmur   ? ? ?Past Surgical History:  ?Procedure Laterality Date  ? COLON SURGERY    ? TRANSTHORACIC ECHOCARDIOGRAM    ? ? ?Current Medications: ?Current Outpatient Medications on File Prior to Visit  ?Medication Sig  ? aspirin 325 MG tablet Take 325 mg by mouth daily.  ? chlorthalidone (HYGROTON) 25 MG tablet TAKE 1 TABLET BY MOUTH  DAILY  ? Cholecalciferol (VITAMIN D) 2000 UNITS CAPS Take 1 capsule (2,000 Units total) by mouth daily.  ? finasteride (PROSCAR) 5 MG tablet Take 1 tablet (5 mg total) by mouth daily.  ? hydrALAZINE (APRESOLINE) 50 MG tablet TAKE 1 TABLET BY MOUTH 3  TIMES DAILY  ? ?No current facility-administered medications on file prior to visit.  ?  ? ?Allergies:   Amlodipine, Lipitor [atorvastatin calcium], Zocor [simvastatin], Bactrim [sulfamethoxazole-trimethoprim], and Pravachol  ? ?Social History  ? ?Tobacco Use  ? Smoking status: Former  ?  Types: Cigarettes  ?  Quit date: 10/18/1981  ?  Years since quitting: 39.6  ? Smokeless tobacco: Never  ?Vaping Use  ? Vaping Use: Never used  ?Substance Use Topics  ? Alcohol use: No  ? Drug use: No  ? ? ?Family History: ?The patient's family history includes Diabetes in his mother; Stroke in his brother. ? ?ROS:   ?Please see the history of present illness.   ?  Additional pertinent ROS negative except as documented.  ? ? ?EKGs/Labs/Other Studies Reviewed:   ? ?The following studies were reviewed today: ? ?[place] Stress Myoview 02/06/2019: ?Nuclear stress EF: 48%. ?The left ventricular ejection fraction is mildly decreased (45-54%). ?No T wave inversion was noted during stress. ?There was no ST segment deviation noted during stress. ?This is an intermediate risk study. ?No ischemia. LVEF 48% with dilated LV and global hypokinesis. This is an intermediate risk study. ? ?Echo 01/28/18 ?- Left ventricle: The cavity size was normal. Wall thickness was ?   normal. Systolic function was normal. The estimated ejection ?  fraction was in the range of 55% to 60%. Wall motion was normal; ?  there were no regional wall motion abnormalities. The study is ?  not technically sufficient to allow evaluation of LV diastolic ?  function. ?- Atrial septum: There was an atrial septal aneurysm. ?  ?Impressions: ?- Normal LV function; sclerotic aortic valve. ? ?EKG:  EKG is personally reviewed.    ?05/26/21: not ordered today ?11/18/2020: not ordered today ?08/13/2020: not ordered today ?03/11/2020: sinus rhythm, 2nd degree AV block type 1, LAFB, PRWP. Compared to prior ECG, there is now nonspecific ST-T pattern in V3-V6 ? ?Recent Labs: ?10/27/2020: Hemoglobin 13.1; Platelets 194 ?02/27/2021: ALT 19; BUN 22; Creat 1.51; Potassium 3.9; Sodium 142  ?Recent Lipid Panel ?   ?Component Value Date/Time  ? CHOL 200 (H) 02/27/2021 1034  ? TRIG 101 02/27/2021 1034  ? HDL 50 02/27/2021 1034  ? CHOLHDL 4.0 02/27/2021 1034  ? VLDL 18 01/28/2018 0538  ? [place] 130 (H) 02/27/2021 1034  ? ? ?Physical Exam:   ? ?VS:  BP (!) 168/76   Pulse 84   Ht '5\' 6"'$  (1.676 m)   Wt 193 lb (87.5 kg)   SpO2 100%   BMI 31.15 kg/m?    ? ?Wt Readings from Last 3 Encounters:  ?05/26/21 193 lb (87.5 kg)  ?02/27/21 197 lb (89.4 kg)  ?11/18/20 191 lb 6.4 oz (86.8 kg)  ?  ?GEN: Well nourished, well developed in no acute distress ?HEENT: Normal, moist mucous membranes ?NECK: No JVD ?CARDIAC: regular rhythm, normal S1 and S2, no rubs or gallops. 1/6 systolic murmur. ?VASCULAR: Radial and DP pulses 2+ bilaterally. No carotid bruits ?RESPIRATORY:  Clear to auscultation without rales, wheezing or rhonchi  ?ABDOMEN: Soft, non-tender, non-distended ?MUSCULOSKELETAL:  Ambulates independently ?SKIN: Warm and dry, no edema ?NEUROLOGIC:  Alert and oriented x 3. No focal neuro deficits noted. ?PSYCHIATRIC:  Normal affect   ? ?ASSESSMENT:   ? ?1. Essential hypertension   ?2. Pure hypercholesterolemia   ?3. Second degree AV block, Mobitz  type I   ?4. Systolic murmur   ?5. Medication management   ? ? ?PLAN:   ? ?Hypertension: ?-checks BP at home, has been variable ?-continue hydralazine, chlorthalidone, terazosin/finasteride ?-I would like him to try to take the 50 mg hydralazine dose if he can, as this appears to have been controlling his BP. He will try. ? ?History of antihypertensives ?-tried amlodipine, had jitteriness/tremors. Had chest wall pain on spironolactone.  ?-no diltiazem/verapamil, beta blocker due to second degree AV block type 1 ([place]) ?-had aches/balance issues on chlorthalidone, but now tolerating ?-had worsening chest wall pain on >50 mg hydralazine. Now cutting to 25 mg hydralazine. Encouraged him to try to take 50 mg BID or at least 50 mg one dose and 25 mg dose the other. ? ?Aspirin use: reviewed indications/recommendations (not recommended for him based on current  guidelines), but he wishes to continue. ? ?Murmur: aortic sclerosis without stenosis, no further workup needed. Echo 01/2018 ? ?History of chest discomfort/tenderness: longstanding, atypical. Lexiscan normal. Mammogram normal. ? ?Second degree AV block, type I: no indication for pacemaker ? ?CV risk counseling and prevention recommendations ?-recommend heart healthy/Mediterranean diet, with whole grains, fruits, vegetable, fish, lean meats, nuts, and olive oil. Limit salt. ?-recommend moderate walking, 3-5 times/week for 30-50 minutes each session. Aim for at least 150 minutes.week. Goal should be pace of 3 miles/hours, or walking 1.5 miles in 30 minutes ?-recommend avoidance of tobacco products. Avoid excess alcohol. ? ?Plan for follow up: 3 mos or sooner as needed ? ?Buford Dresser, MD, PhD, [facility] ?[place]  ? ?Medication Adjustments/Labs and Tests Ordered: ?Current medicines are reviewed at length with the patient today.  Concerns regarding medicines are outlined above.  ?No orders of the defined types were placed in this  encounter. ? ? ?No orders of the defined types were placed in this encounter. ? ? ? ?Patient Instructions  ?Medication Instructions:  ?Please try to take the full pill (50 mg) dose of hydralazine if you can. Your blood pressure is s

## 2021-05-26 NOTE — Patient Instructions (Signed)
Medication Instructions:  ?Please try to take the full pill (50 mg) dose of hydralazine if you can. Your blood pressure is still high on the 25 mg (1/2 pill) twice a day.  ? ?*If you need a refill on your cardiac medications before your next appointment, please call your pharmacy* ? ? ?Lab Work: ?None ?If you have labs (blood work) drawn today and your tests are completely normal, you will receive your results only by: ?MyChart Message (if you have MyChart) OR ?A paper copy in the mail ?If you have any lab test that is abnormal or we need to change your treatment, we will call you to review the results. ? ? ?Testing/Procedures: ?None ? ? ?Follow-Up: ?At [facility], you and your health needs are our priority.  As part of our continuing mission to provide you with exceptional heart care, we have created designated Provider Care Teams.  These Care Teams include your primary Cardiologist (physician) and Advanced Practice Providers (APPs -  Physician Assistants and Nurse Practitioners) who all work together to provide you with the care you need, when you need it. ? ?We recommend signing up for the patient portal called "MyChart".  Sign up information is provided on this After Visit Summary.  MyChart is used to connect with patients for Virtual Visits (Telemedicine).  Patients are able to view lab/test results, encounter notes, upcoming appointments, etc.  Non-urgent messages can be sent to your provider as well.   ?To learn more about what you can do with MyChart, go to [email or web address].   ? ?Your next appointment:   ?3 month(s) ? ?The format for your next appointment:   ?In Person ? ?Provider:   ?Buford Dresser, MD  ? ? ?Other Instructions ?Please call me if your blood pressure is consistently >150 on the top number ?  ?

## 2021-06-17 DIAGNOSIS — R7303 Prediabetes: Secondary | ICD-10-CM | POA: Diagnosis not present

## 2021-06-17 DIAGNOSIS — I358 Other nonrheumatic aortic valve disorders: Secondary | ICD-10-CM | POA: Diagnosis not present

## 2021-06-17 DIAGNOSIS — M79672 Pain in left foot: Secondary | ICD-10-CM | POA: Diagnosis not present

## 2021-06-17 DIAGNOSIS — Z0000 Encounter for general adult medical examination without abnormal findings: Secondary | ICD-10-CM | POA: Diagnosis not present

## 2021-06-17 DIAGNOSIS — M79671 Pain in right foot: Secondary | ICD-10-CM | POA: Diagnosis not present

## 2021-06-17 DIAGNOSIS — Z86010 Personal history of colonic polyps: Secondary | ICD-10-CM | POA: Diagnosis not present

## 2021-06-17 DIAGNOSIS — Z23 Encounter for immunization: Secondary | ICD-10-CM | POA: Diagnosis not present

## 2021-06-17 DIAGNOSIS — I441 Atrioventricular block, second degree: Secondary | ICD-10-CM | POA: Diagnosis not present

## 2021-06-18 DIAGNOSIS — N1832 Chronic kidney disease, stage 3b: Secondary | ICD-10-CM | POA: Diagnosis not present

## 2021-06-18 DIAGNOSIS — E7800 Pure hypercholesterolemia, unspecified: Secondary | ICD-10-CM | POA: Diagnosis not present

## 2021-06-18 DIAGNOSIS — R7303 Prediabetes: Secondary | ICD-10-CM | POA: Diagnosis not present

## 2021-06-18 DIAGNOSIS — I10 Essential (primary) hypertension: Secondary | ICD-10-CM | POA: Diagnosis not present

## 2021-08-17 DIAGNOSIS — H04203 Unspecified epiphora, bilateral lacrimal glands: Secondary | ICD-10-CM | POA: Diagnosis not present

## 2021-08-17 DIAGNOSIS — H02106 Unspecified ectropion of left eye, unspecified eyelid: Secondary | ICD-10-CM | POA: Diagnosis not present

## 2021-09-10 ENCOUNTER — Ambulatory Visit (HOSPITAL_BASED_OUTPATIENT_CLINIC_OR_DEPARTMENT_OTHER): Payer: Medicare Other | Admitting: Cardiology

## 2021-09-10 VITALS — BP 184/92 | HR 61 | Ht 65.0 in | Wt 185.8 lb

## 2021-09-10 DIAGNOSIS — R011 Cardiac murmur, unspecified: Secondary | ICD-10-CM | POA: Diagnosis not present

## 2021-09-10 DIAGNOSIS — I441 Atrioventricular block, second degree: Secondary | ICD-10-CM | POA: Diagnosis not present

## 2021-09-10 DIAGNOSIS — R0789 Other chest pain: Secondary | ICD-10-CM

## 2021-09-10 DIAGNOSIS — I10 Essential (primary) hypertension: Secondary | ICD-10-CM

## 2021-09-10 MED ORDER — CHLORTHALIDONE 25 MG PO TABS: 25.0000 mg | ORAL_TABLET | Freq: Every day | ORAL | 3 refills | Status: DC

## 2021-09-10 NOTE — Progress Notes (Signed)
Cardiology Office Note:    Date:  09/10/2021   ID:  Patrick Villa, DOB [birth year], MRN [id number]  PCP:  Patrick Jewel, Patrick Villa  Cardiologist:  Patrick Dresser, Patrick Villa Patrick Villa  CC: follow up of hypertension  History of Present Illness:    Patrick Villa is a 88 y.o. male with a hx of COPD, hyperlipidemia, hypertension, second degree AV block type I who is seen in follow up today. I initially saw him as a new consult 01/2018 for the evaluation and management of second degree, type I heart block.   He was seen in the hospital on 01/27/18 and 01/28/18 by Patrick Villa for bilateral breast tenderness. At that time, troponins were negative, echo was unremarkable. Cardizem was stopped due to second degree AV block, type I.   At his last appointment he reported lightheadedness in the morning, like he might fall over. He then checked his BP which was typically 140's-150's; no lows. On average his BP was labile at home, ranging 010-932 systolic. He had taken chlorthalidone, hydralazine, and finasteride that AM. Took terazosin and hydralazine at nighttime, doesn't take mid-day. Decreased to 25 mg dose of hydralazine as the 50 mg dose makes his chest sore. Started on sucralfate and PPI in 02/2021, this helped some but he notes better relief with Pepto-Bismol. Denied melena or hematochezia. He was worried about cancer of the stomach. No decrease in appetite ("I eat like a hog") and no weight loss. Had worsening chest wall pain on >50 mg hydralazine: was cutting to 25 mg hydralazine. Encouraged him to try to take 50 mg BID or at least 50 mg one dose and 25 mg dose the other.  Today:  Current regimen: 9:30 AM - 10 AM:  25 mg Chlorthalidone, Finasteride, 25 mg Hydralazine, Vitamin D.  PM:  Terazosin  In clinic his blood pressure is 182/70. At home his blood pressure has been "surprising" to him, with readings such as 127/74. Mostly ranging from 355/73-22 up to 025 systolic. Sometimes he is taking his blood  pressure prior to taking his antihypertensives.  He states that if he takes hydralazine at night, his blood pressure is much higher the next morning. After stopping benazepril he noticed his blood pressure gradually decreasing.  Sometimes he feels a little tightness in his throat, but not in his chest. This sensation is typically brief.  Also he complains of occasional lightheadedness.  He denies any palpitations, chest pain, shortness of breath, or peripheral edema. No headaches, syncope, orthopnea, or PND.   Past Medical History:  Diagnosis Date   Colon polyps    COPD (chronic obstructive pulmonary disease) ([place])    Diabetes mellitus without complication ([place]) 04/26/7060   Echocardiogram abnormal    Ganglion cyst    Hematuria    Hyperlipidemia    Hypertension    Systolic murmur     Past Surgical History:  Procedure Laterality Date   COLON SURGERY     TRANSTHORACIC ECHOCARDIOGRAM      Current Medications: Current Outpatient Medications on File Prior to Visit  Medication Sig   aspirin 325 MG tablet Take 325 mg by mouth daily.   Cholecalciferol (VITAMIN D) 2000 UNITS CAPS Take 1 capsule (2,000 Units total) by mouth daily.   finasteride (PROSCAR) 5 MG tablet Take 1 tablet (5 mg total) by mouth daily.   hydrALAZINE (APRESOLINE) 50 MG tablet TAKE 1 TABLET BY MOUTH 3  TIMES DAILY   terazosin (HYTRIN) 5 MG capsule Take 5 mg by  mouth at bedtime.   No current facility-administered medications on file prior to visit.     Allergies:   Amlodipine, Lipitor [atorvastatin calcium], Zocor [simvastatin], Bactrim [sulfamethoxazole-trimethoprim], and Pravachol   Social History   Tobacco Use   Smoking status: Former    Types: Cigarettes    Quit date: 10/18/1981    Years since quitting: 39.9   Smokeless tobacco: Never  Vaping Use   Vaping Use: Never used  Substance Use Topics   Alcohol use: No   Drug use: No    Family History: The patient's family history includes Diabetes in his  mother; Stroke in his brother.  ROS:   Please see the history of present illness.   (+) Throat tightness (+) Lightheadedness Additional pertinent ROS negative except as documented.    EKGs/Labs/Other Studies Reviewed:    The following studies were reviewed today:  Patrick Villa 02/06/2019: Nuclear stress EF: 48%. The left ventricular ejection fraction is mildly decreased (45-54%). No T wave inversion was noted during stress. There was no ST segment deviation noted during stress. This is an intermediate risk study. No ischemia. LVEF 48% with dilated LV and global hypokinesis. This is an intermediate risk study.  Echo 01/28/18 - Left ventricle: The cavity size was normal. Wall thickness was   normal. Systolic function was normal. The estimated ejection   fraction was in the range of 55% to 60%. Wall motion was normal;   there were no regional wall motion abnormalities. The study is   not technically sufficient to allow evaluation of LV diastolic   function. - Atrial septum: There was an atrial septal aneurysm.   Impressions: - Normal LV function; sclerotic aortic valve.  EKG:  EKG is personally reviewed.    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 Compared to prior ECG, there is now nonspecific ST-T pattern in V3-V6  Recent Labs: 10/27/2020: Hemoglobin 13.1; Platelets 194 02/27/2021: ALT 19; BUN 22; Creat 1.51; Potassium 3.9; Sodium 142   Recent Lipid Panel    Component Value Date/Time   CHOL 200 (H) 02/27/2021 1034   TRIG 101 02/27/2021 1034   HDL 50 02/27/2021 1034   CHOLHDL 4.0 02/27/2021 1034   VLDL 18 01/28/2018 0538   LDLCALC 130 (H) 02/27/2021 1034    Physical Exam:    VS:  BP (!) 184/92 (BP Location: Left Arm, Patient Position: Sitting, Cuff Size: Normal)   Pulse 61   Ht '5\' 5"'$  (1.651 m)   Wt 185 lb 12.8 oz (84.3 kg)   BMI 30.92 kg/m     Wt Readings from Last 3 Encounters:   09/10/21 185 lb 12.8 oz (84.3 kg)  05/26/21 193 lb (87.5 kg)  02/27/21 197 lb (89.4 kg)    GEN: Well nourished, well developed in no acute distress HEENT: Normal, moist mucous membranes NECK: No JVD CARDIAC: regular rhythm with occasional dropped beats, normal S1 and S2, no rubs or gallops. 1/6 systolic murmur. VASCULAR: Radial and DP pulses 2+ bilaterally. No carotid bruits RESPIRATORY:  Clear to auscultation without rales, wheezing or rhonchi  ABDOMEN: Soft, non-tender, non-distended MUSCULOSKELETAL:  Ambulates independently SKIN: Warm and dry, no edema NEUROLOGIC:  Alert and oriented x 3. No focal neuro deficits noted. PSYCHIATRIC:  Normal affect    ASSESSMENT:    1. Essential hypertension   2. Second degree AV block, Mobitz type I   3. Systolic murmur   4. Chest wall  tenderness   5. Uncontrolled hypertension      PLAN:    Hypertension: -I am concerned that his home BP cuff is not accurate, or that home log is not accurate. I am unsure why taking PM hydralazine or benazepril should raise his BP.  -he does not have log today, reciting from memory -I would like him to return for nurse visit in 2 weeks with his home BP cuff and log to review and check against our cuff in the office -he has strong personal beliefs about his medications. He is willing to continue the chlorthalidone and will contemplate hydralazine. Does not want to restart the benazepril at this time, but I discussed that this may be necessary  History of antihypertensives -tried amlodipine, had jitteriness/tremors. Had chest wall pain on spironolactone.  -no diltiazem/verapamil, beta blocker due to second degree AV block type 1 (Patrick Villa) -had aches/balance issues on chlorthalidone, but now tolerating -had worsening chest wall pain on >50 mg hydralazine. Now cutting to 25 mg hydralazine. Encouraged him to try to take 50 mg BID or at least 50 mg one dose and 25 mg dose the other.  Aspirin use: reviewed  indications/recommendations (not recommended for him based on current guidelines), but he wishes to continue.  Murmur: aortic sclerosis without stenosis, no further workup needed. Echo 01/2018  History of chest discomfort/tenderness: longstanding, atypical. Lexiscan normal. Mammogram normal.  Second degree AV block, type I: no indication for pacemaker  CV risk counseling and prevention recommendations -recommend heart healthy/Mediterranean diet, with whole grains, fruits, vegetable, fish, lean meats, nuts, and olive oil. Limit salt. -recommend moderate walking, 3-5 times/week for 30-50 minutes each session. Aim for at least 150 minutes.week. Goal should be pace of 3 miles/hours, or walking 1.5 miles in 30 minutes -recommend avoidance of tobacco products. Avoid excess alcohol.  Plan for follow up: 2 weeks or sooner as needed  Patrick Dresser, Patrick Villa, Patrick Villa, [facility]   Medication Adjustments/Labs and Tests Ordered: Current medicines are reviewed at length with the patient today.  Concerns regarding medicines are outlined above.   Orders Placed This Encounter  Procedures   EKG 12-Lead   Meds ordered this encounter  Medications   chlorthalidone (HYGROTON) 25 MG tablet    Sig: Take 1 tablet (25 mg total) by mouth daily.    Dispense:  90 tablet    Refill:  3    Requesting 1 year supply   Patient Instructions  Medication Instructions:  Your Physician recommend you continue on your current medication as directed.     We will give you a blood pressure log. Please bring this and your blood pressure cuff to the next visit.   *If you need a refill on your cardiac medications before your next appointment, please call your pharmacy*   Lab Work: None ordered today   Testing/Procedures: None ordered today   Follow-Up: At [facility], you and your health needs are our priority.  As part of our continuing mission to provide you with exceptional heart care,  we have created designated Provider Care Teams.  These Care Teams include your primary Cardiologist (physician) and Advanced Practice Providers (APPs -  Physician Assistants and Nurse Practitioners) who all work together to provide you with the care you need, when you need it.  We recommend signing up for the patient portal called "MyChart".  Sign up information is provided on this After Visit Summary.  MyChart is used to connect with patients for Virtual Visits (Telemedicine).  Patients are able to view lab/test results, encounter notes, upcoming appointments, etc.  Non-urgent messages can be sent to your provider as well.   To learn more about what you can do with MyChart, go to [email or web address].    Your next appointment:   2 week(s)  The format for your next appointment:   In Person  Provider: Nurse visit (Blood pressure check)  Your physician recommends that you schedule a follow-up appointment in 3 months with Dr. Harrell Gave.        We will give you a blood pressure log. Please bring this and your blood pressure cuff to the next visit.    I,Patrick Villa,acting as a Education administrator for [employer], Patrick Villa.,have documented all relevant documentation on the behalf of Patrick Dresser, Patrick Villa,as directed by  Patrick Dresser, Patrick Villa while in the presence of Patrick Dresser, Patrick Villa.  I, Patrick Dresser, Patrick Villa, have reviewed all documentation for this visit. The documentation on 09/10/21 for the exam, diagnosis, procedures, and orders are all accurate and complete.   Signed, Patrick Dresser, Patrick Villa Patrick Villa 09/10/2021    [place]

## 2021-09-10 NOTE — Patient Instructions (Addendum)
Medication Instructions:  Your Physician recommend you continue on your current medication as directed.     We will give you a blood pressure log. Please bring this and your blood pressure cuff to the next visit.   *If you need a refill on your cardiac medications before your next appointment, please call your pharmacy*   Lab Work: None ordered today   Testing/Procedures: None ordered today   Follow-Up: At [facility], you and your health needs are our priority.  As part of our continuing mission to provide you with exceptional heart care, we have created designated Provider Care Teams.  These Care Teams include your primary Cardiologist (physician) and Advanced Practice Providers (APPs -  Physician Assistants and Nurse Practitioners) who all work together to provide you with the care you need, when you need it.  We recommend signing up for the patient portal called "MyChart".  Sign up information is provided on this After Visit Summary.  MyChart is used to connect with patients for Virtual Visits (Telemedicine).  Patients are able to view lab/test results, encounter notes, upcoming appointments, etc.  Non-urgent messages can be sent to your provider as well.   To learn more about what you can do with MyChart, go to [email or web address].    Your next appointment:   2 week(s)  The format for your next appointment:   In Person  Provider: Nurse visit (Blood pressure check)  Your physician recommends that you schedule a follow-up appointment in 3 months with Dr. Harrell Gave.        We will give you a blood pressure log. Please bring this and your blood pressure cuff to the next visit.

## 2021-10-01 ENCOUNTER — Ambulatory Visit (HOSPITAL_BASED_OUTPATIENT_CLINIC_OR_DEPARTMENT_OTHER): Payer: Medicare Other

## 2021-10-01 ENCOUNTER — Encounter (HOSPITAL_BASED_OUTPATIENT_CLINIC_OR_DEPARTMENT_OTHER): Payer: Self-pay

## 2021-10-01 VITALS — BP 166/87 | HR 85 | Wt 188.8 lb

## 2021-10-01 DIAGNOSIS — I10 Essential (primary) hypertension: Secondary | ICD-10-CM

## 2021-10-01 MED ORDER — HYDRALAZINE HCL 50 MG PO TABS: ORAL_TABLET | ORAL | 3 refills | Status: DC

## 2021-10-01 NOTE — Progress Notes (Signed)
   Nurse Visit   Date of Encounter: 10/01/2021 ID: Patrick Villa, DOB 07/11/1933, MRN [id number]  PCP:  Mckinley Jewel, MD   [place] [facility] Cardiologist:  Buford Dresser, MD      Visit Details   VS:  BP (!) 166/87 Comment: home cuff  Pulse 85   Wt 188 lb 12.8 oz (85.6 kg)   BMI 31.42 kg/m  , BMI Body mass index is 31.42 kg/m.  Wt Readings from Last 3 Encounters:  10/01/21 188 lb 12.8 oz (85.6 kg)  09/10/21 185 lb 12.8 oz (84.3 kg)  05/26/21 193 lb (87.5 kg)     Reason for visit: Blood pressure check  Pt reported he is only taking Hydralazine 50 mg in the morning  Performed today: Vitals, Provider consulted:Dr. Harrell Gave, and Education Changes (medications, testing, etc.) : Increase Hydralazine to 50 mg in the morning and 25 mg at night Length of Visit: 15 minutes    Medications Adjustments/Labs and Tests Ordered: No orders of the defined types were placed in this encounter.  Meds ordered this encounter  Medications   hydrALAZINE (APRESOLINE) 50 MG tablet    Sig: Take 1 tablet (50 mg total) by mouth every morning AND 0.5 tablets (25 mg total) at bedtime.    Dispense:  120 tablet    Refill:  3    Requesting 1 year supply     Signed, Meryl Crutch, RN  10/01/2021 8:23 AM

## 2021-10-01 NOTE — Patient Instructions (Signed)
Medication Instructions:  Take Hydralazine 50 mg (1 tablet) in the morning and 25 mg (1/2 tablet) at night  *If you need a refill on your cardiac medications before your next appointment, please call your pharmacy*   Lab Work: None ordered today   Testing/Procedures: None ordered today   Follow-Up: At [facility], you and your health needs are our priority.  As part of our continuing mission to provide you with exceptional heart care, we have created designated Provider Care Teams.  These Care Teams include your primary Cardiologist (physician) and Advanced Practice Providers (APPs -  Physician Assistants and Nurse Practitioners) who all work together to provide you with the care you need, when you need it.  We recommend signing up for the patient portal called "MyChart".  Sign up information is provided on this After Visit Summary.  MyChart is used to connect with patients for Virtual Visits (Telemedicine).  Patients are able to view lab/test results, encounter notes, upcoming appointments, etc.  Non-urgent messages can be sent to your provider as well.   To learn more about what you can do with MyChart, go to [email or web address].    Your next appointment:   December 15, 2021 @ 8:20  The format for your next appointment:   In Person  Provider:   Buford Dresser, MD

## 2021-10-13 ENCOUNTER — Encounter (HOSPITAL_BASED_OUTPATIENT_CLINIC_OR_DEPARTMENT_OTHER): Payer: Self-pay | Admitting: Cardiology

## 2021-10-26 DIAGNOSIS — M25511 Pain in right shoulder: Secondary | ICD-10-CM | POA: Diagnosis not present

## 2021-10-26 DIAGNOSIS — Z23 Encounter for immunization: Secondary | ICD-10-CM | POA: Diagnosis not present

## 2021-10-27 DIAGNOSIS — U071 COVID-19: Secondary | ICD-10-CM | POA: Diagnosis not present

## 2021-10-28 DIAGNOSIS — U071 COVID-19: Secondary | ICD-10-CM | POA: Diagnosis not present

## 2021-10-29 ENCOUNTER — Ambulatory Visit: Payer: Medicare Other | Admitting: Orthopaedic Surgery

## 2021-11-05 ENCOUNTER — Ambulatory Visit (INDEPENDENT_AMBULATORY_CARE_PROVIDER_SITE_OTHER): Payer: Medicare Other | Admitting: Orthopaedic Surgery

## 2021-11-05 ENCOUNTER — Ambulatory Visit (INDEPENDENT_AMBULATORY_CARE_PROVIDER_SITE_OTHER): Payer: Medicare Other

## 2021-11-05 ENCOUNTER — Encounter: Payer: Self-pay | Admitting: Orthopaedic Surgery

## 2021-11-05 DIAGNOSIS — M25511 Pain in right shoulder: Secondary | ICD-10-CM

## 2021-11-05 MED ORDER — DICLOFENAC SODIUM 75 MG PO TBEC: 75.0000 mg | DELAYED_RELEASE_TABLET | Freq: Two times a day (BID) | ORAL | 0 refills | Status: DC | PRN

## 2021-11-05 NOTE — Progress Notes (Signed)
Office Visit Note   Patient: Patrick Villa           Date of Birth: 08/07/1933           MRN: [id number] Visit Date: 11/05/2021              Requested by: Collene Leyden, MD [address] [place],  [place] [address] PCP: Collene Leyden, MD   Assessment & Plan: Visit Diagnoses:  1. Acute pain of right shoulder     Plan: Impression is right shoulder pain and swelling which has improved since the injury.  Unclear as to exact etiology.  It is possible that the patient has torn part of his deltoid off of the acromion and has developed a hematoma.  We have recommended heat, nsaids and relative rest.  Follow up if swelling and symptoms do not improve.    Follow-Up Instructions: Return if symptoms worsen or fail to improve.   Orders:  Orders Placed This Encounter  Procedures   XR Shoulder Right   Meds ordered this encounter  Medications   diclofenac (VOLTAREN) 75 MG EC tablet    Sig: Take 1 tablet (75 mg total) by mouth 2 (two) times daily as needed. Take with food    Dispense:  60 tablet    Refill:  0      Procedures: No procedures performed   Clinical Data: No additional findings.   Subjective: Chief Complaint  Patient presents with   Right Shoulder - Pain    HPI patient is a pleasant 88 year old gentleman who comes in today with right shoulder pain.  Approximately 3 weeks ago he was pulling electric fence when he externally rotated his right shoulder causing pain and a pop.  The pain he has is primarily to the proximal deltoid and top of the shoulder.  He denies any pain at rest.  He denies any weakness.  He does have pain with shoulder abduction, external rotation and forward flexion.  He is not taking medication for pain.  Review of Systems as detailed in HPI.  All others reviewed and are negative.   Objective: Vital Signs: There were no vitals taken for this visit.  Physical Exam well-developed well-nourished gentleman in no acute distress.  Alert and  oriented x3.  Ortho Exam right shoulder exam reveals full active range of motion in all planes.  He does have a golf ball sized area of swelling to the top of the shoulder distal to the lateral acromion.  He can internally rotate to T12.  Does have slight pain with empty can test.  Negative O'Brien's, negative speeds and negative bearhug.  Full strength throughout.  He is neurovascularly intact distally.  Specialty Comments:  No specialty comments available.  Imaging: XR Shoulder Right  Result Date: 11/05/2021 X-rays demonstrate inferior spurring of the glenoid.  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     PMFS History: Patient Active Problem List   Diagnosis Date Noted   Pure hypercholesterolemia 10/27/2020   Prediabetes 10/27/2020   Bilateral foot pain 04/17/2020   ED (erectile dysfunction) 03/27/2019   BPH (benign prostatic hyperplasia) 05/23/2018   Second degree AV block, Mobitz type I 02/20/2018   Vitamin D deficiency 07/17/2014   Essential hypertension    COPD (chronic obstructive pulmonary disease) (HCC)    Hyperlipidemia    Colon polyps    Systolic murmur    Ganglion cyst    Past Medical History:  Diagnosis Date   Colon polyps  COPD (chronic obstructive pulmonary disease) (HCC)    Diabetes mellitus without complication ([place]) 07/03/6166   Echocardiogram abnormal    Ganglion cyst    Hematuria    Hyperlipidemia    Hypertension    Systolic murmur     Family History  Problem Relation Age of Onset   Diabetes Mother    Stroke Brother     Past Surgical History:  Procedure Laterality Date   COLON SURGERY     TRANSTHORACIC ECHOCARDIOGRAM     Social History   Occupational History   Not on file  Tobacco Use   Smoking status: Former    Types: Cigarettes    Quit date: 10/18/1981    Years since quitting: 40.0   Smokeless tobacco: Never  Vaping Use   Vaping Use: Never used  Substance and Sexual Activity   Alcohol use: No   Drug use: No   Sexual activity: Not Currently

## 2021-11-25 DIAGNOSIS — H02834 Dermatochalasis of left upper eyelid: Secondary | ICD-10-CM | POA: Diagnosis not present

## 2021-11-25 DIAGNOSIS — H02135 Senile ectropion of left lower eyelid: Secondary | ICD-10-CM | POA: Diagnosis not present

## 2021-11-25 DIAGNOSIS — H02132 Senile ectropion of right lower eyelid: Secondary | ICD-10-CM | POA: Diagnosis not present

## 2021-11-25 DIAGNOSIS — H02031 Senile entropion of right upper eyelid: Secondary | ICD-10-CM | POA: Diagnosis not present

## 2021-12-15 ENCOUNTER — Ambulatory Visit (HOSPITAL_BASED_OUTPATIENT_CLINIC_OR_DEPARTMENT_OTHER): Payer: Medicare Other | Admitting: Cardiology

## 2021-12-15 ENCOUNTER — Encounter (HOSPITAL_BASED_OUTPATIENT_CLINIC_OR_DEPARTMENT_OTHER): Payer: Self-pay | Admitting: Cardiology

## 2021-12-15 VITALS — BP 142/84 | HR 86 | Ht 66.0 in | Wt 192.6 lb

## 2021-12-15 DIAGNOSIS — I10 Essential (primary) hypertension: Secondary | ICD-10-CM

## 2021-12-15 DIAGNOSIS — R0789 Other chest pain: Secondary | ICD-10-CM

## 2021-12-15 DIAGNOSIS — R011 Cardiac murmur, unspecified: Secondary | ICD-10-CM

## 2021-12-15 DIAGNOSIS — I441 Atrioventricular block, second degree: Secondary | ICD-10-CM

## 2021-12-15 DIAGNOSIS — E7800 Pure hypercholesterolemia, unspecified: Secondary | ICD-10-CM | POA: Diagnosis not present

## 2021-12-15 NOTE — Patient Instructions (Signed)
Medication Instructions:  The current medical regimen is effective;  continue present plan and medications.  *If you need a refill on your cardiac medications before your next appointment, please call your pharmacy*  Follow-Up: At [facility], you and your health needs are our priority.  As part of our continuing mission to provide you with exceptional heart care, we have created designated Provider Care Teams.  These Care Teams include your primary Cardiologist (physician) and Advanced Practice Providers (APPs -  Physician Assistants and Nurse Practitioners) who all work together to provide you with the care you need, when you need it.  We recommend signing up for the patient portal called "MyChart".  Sign up information is provided on this After Visit Summary.  MyChart is used to connect with patients for Virtual Visits (Telemedicine).  Patients are able to view lab/test results, encounter notes, upcoming appointments, etc.  Non-urgent messages can be sent to your provider as well.   To learn more about what you can do with MyChart, go to [email or web address].    Your next appointment:   6 month(s)  The format for your next appointment:   In Person  Provider:   Buford Dresser, MD

## 2021-12-15 NOTE — Progress Notes (Signed)
Cardiology Office Note:    Date:  12/15/2021   ID:  Patrick Villa, DOB 12/08/1933, MRN [id number]  PCP:  Collene Leyden, MD  Cardiologist:  Buford Dresser, MD PhD  CC: follow up of hypertension  History of Present Illness:    Patrick Villa is a 88 y.o. male with a hx of COPD, hyperlipidemia, hypertension, second degree AV block type I who is seen in follow up today. I initially saw him as a new consult 01/2018 for the evaluation and management of second degree, type I heart block.   He was seen in the hospital on 01/27/18 and 01/28/18 by Drs. Fredrik Rigger for bilateral breast tenderness. At that time, troponins were negative, echo was unremarkable. Cardizem was stopped due to second degree AV block, type I.   Long term issues have been managing hypertension. He has had several intolerances, see below.   Today: Current regimen: 9:30 AM - 10 AM:  25 mg Chlorthalidone, Finasteride, 50 mg Hydralazine, Vitamin D.  PM:  Terazosin, 25 mg hydralazine.  Doesn't have BP log today, hasn't taken BP yet this morning. Notes that his BP at home has been variable, ranging from 578I-696E systolic, notes most are in 130s-150s. Hasn't taken AM medications yet today. Notes that his highest pressures are in the AM, improves throughout the day.  Continues to have rare left breast tenderness, no deep chest heaviness/pain.   Denies chest pain, shortness of breath at rest or with normal exertion. No PND, orthopnea, LE edema or unexpected weight gain. No syncope or palpitations.   Past Medical History:  Diagnosis Date   Colon polyps    COPD (chronic obstructive pulmonary disease) ([place])    Diabetes mellitus without complication ([place]) 09/30/2839   Echocardiogram abnormal    Ganglion cyst    Hematuria    Hyperlipidemia    Hypertension    Systolic murmur     Past Surgical History:  Procedure Laterality Date   COLON SURGERY     TRANSTHORACIC ECHOCARDIOGRAM      Current Medications: Current  Outpatient Medications on File Prior to Visit  Medication Sig   aspirin 325 MG tablet Take 325 mg by mouth daily.   chlorthalidone (HYGROTON) 25 MG tablet Take 1 tablet (25 mg total) by mouth daily.   Cholecalciferol (VITAMIN D) 2000 UNITS CAPS Take 1 capsule (2,000 Units total) by mouth daily.   diclofenac (VOLTAREN) 75 MG EC tablet Take 1 tablet (75 mg total) by mouth 2 (two) times daily as needed. Take with food   finasteride (PROSCAR) 5 MG tablet Take 1 tablet (5 mg total) by mouth daily.   hydrALAZINE (APRESOLINE) 50 MG tablet Take 1 tablet (50 mg total) by mouth every morning AND 0.5 tablets (25 mg total) at bedtime.   terazosin (HYTRIN) 5 MG capsule Take 5 mg by mouth at bedtime.   No current facility-administered medications on file prior to visit.     Allergies:   Amlodipine, Lipitor [atorvastatin calcium], Zocor [simvastatin], Bactrim [sulfamethoxazole-trimethoprim], and Pravachol   Social History   Tobacco Use   Smoking status: Former    Types: Cigarettes    Quit date: 10/18/1981    Years since quitting: 40.1   Smokeless tobacco: Never  Vaping Use   Vaping Use: Never used  Substance Use Topics   Alcohol use: No   Drug use: No    Family History: The patient's family history includes Diabetes in his mother; Stroke in his brother.  ROS:   Please see  the history of present illness.   Additional pertinent ROS negative except as documented.    EKGs/Labs/Other Studies Reviewed:    The following studies were reviewed today:  [place] 02/06/2019: Nuclear stress EF: 48%. The left ventricular ejection fraction is mildly decreased (45-54%). No T wave inversion was noted during stress. There was no ST segment deviation noted during stress. This is an intermediate risk study. No ischemia. LVEF 48% with dilated LV and global hypokinesis. This is an intermediate risk study.  Echo 01/28/18 - Left ventricle: The cavity size was normal. Wall thickness was    normal. Systolic function was normal. The estimated ejection   fraction was in the range of 55% to 60%. Wall motion was normal;   there were no regional wall motion abnormalities. The study is   not technically sufficient to allow evaluation of LV diastolic   function. - Atrial septum: There was an atrial septal aneurysm.   Impressions: - Normal LV function; sclerotic aortic valve.  EKG:  EKG is personally reviewed.    12/15/21: not ordered today 09/10/2021:  SR with 2nd degree AV block type 1, LVH 03/11/2020: sinus rhythm, 2nd degree AV block type 1, LAFB, PRWP. Compared to prior ECG, there is now nonspecific ST-T pattern in V3-V6  Recent Labs: 02/27/2021: ALT 19; BUN 22; Creat 1.51; Potassium 3.9; Sodium 142   Recent Lipid Panel    Component Value Date/Time   CHOL 200 (H) 02/27/2021 1034   TRIG 101 02/27/2021 1034   HDL 50 02/27/2021 1034   CHOLHDL 4.0 02/27/2021 1034   VLDL 18 01/28/2018 0538   LDLCALC 130 (H) 02/27/2021 1034    Physical Exam:    VS:  BP (!) 142/84   Pulse 86   Ht '5\' 6"'$  (1.676 m)   Wt 192 lb 9.6 oz (87.4 kg)   SpO2 97%   BMI 31.09 kg/m     Wt Readings from Last 3 Encounters:  12/15/21 192 lb 9.6 oz (87.4 kg)  10/01/21 188 lb 12.8 oz (85.6 kg)  09/10/21 185 lb 12.8 oz (84.3 kg)    GEN: Well nourished, well developed in no acute distress HEENT: Normal, moist mucous membranes NECK: No JVD CARDIAC: regular rhythm, normal S1 and S2, no rubs or gallops. 1/6 systolic murmur. VASCULAR: Radial and DP pulses 2+ bilaterally. No carotid bruits RESPIRATORY:  Clear to auscultation without rales, wheezing or rhonchi  ABDOMEN: Soft, non-tender, non-distended MUSCULOSKELETAL:  Ambulates independently SKIN: Warm and dry, no edema NEUROLOGIC:  Alert and oriented x 3. No focal neuro deficits noted. PSYCHIATRIC:  Normal affect    ASSESSMENT:    1. Essential hypertension   2. Second degree AV block, Mobitz type I   3. Systolic murmur   4. Pure  hypercholesterolemia   5. Chest wall tenderness    PLAN:    Hypertension: -he does not have log today, reciting from memory -this has been a long term balance for him. Recheck BP 142/84 before AM medications. I am ok with this level given that it has been severely elevated in the past -he has strong personal beliefs about his medications, we focus on shared decision making  History of antihypertensives -tried amlodipine, had jitteriness/tremors.  -Had chest wall pain on spironolactone.  -no diltiazem/verapamil, beta blocker due to second degree AV block type 1 ([place]) -had aches/balance issues on chlorthalidone, but now tolerating -had worsening chest wall pain on >50 mg hydralazine. Now cutting to 25 mg hydralazine. Encouraged him to try to take  50 mg BID or at least 50 mg one dose and 25 mg dose the other.  Aspirin use: reviewed indications/recommendations (not recommended for him based on current guidelines), but he wishes to continue.  Murmur: aortic sclerosis without stenosis, no further workup needed. Echo 01/2018  History of chest discomfort/tenderness: longstanding, atypical. Lexiscan normal. Mammogram normal.  Second degree AV block, type I: no indication for pacemaker  CV risk counseling and prevention recommendations -recommend heart healthy/Mediterranean diet, with whole grains, fruits, vegetable, fish, lean meats, nuts, and olive oil. Limit salt. -recommend moderate walking, 3-5 times/week for 30-50 minutes each session. Aim for at least 150 minutes.week. Goal should be pace of 3 miles/hours, or walking 1.5 miles in 30 minutes -recommend avoidance of tobacco products. Avoid excess alcohol.  Plan for follow up: 6 mos or sooner as needed  Buford Dresser, MD, PhD, [facility]   Medication Adjustments/Labs and Tests Ordered: Current medicines are reviewed at length with the patient today.  Concerns regarding medicines are outlined above.    No orders of the defined types were placed in this encounter.  No orders of the defined types were placed in this encounter.  Patient Instructions  Medication Instructions:  The current medical regimen is effective;  continue present plan and medications.  *If you need a refill on your cardiac medications before your next appointment, please call your pharmacy*  Follow-Up: At [facility], you and your health needs are our priority.  As part of our continuing mission to provide you with exceptional heart care, we have created designated Provider Care Teams.  These Care Teams include your primary Cardiologist (physician) and Advanced Practice Providers (APPs -  Physician Assistants and Nurse Practitioners) who all work together to provide you with the care you need, when you need it.  We recommend signing up for the patient portal called "MyChart".  Sign up information is provided on this After Visit Summary.  MyChart is used to connect with patients for Virtual Visits (Telemedicine).  Patients are able to view lab/test results, encounter notes, upcoming appointments, etc.  Non-urgent messages can be sent to your provider as well.   To learn more about what you can do with MyChart, go to [email or web address].    Your next appointment:   6 month(s)  The format for your next appointment:   In Person  Provider:   Buford Dresser, MD          Signed, Buford Dresser, MD PhD 12/15/2021    [place]

## 2021-12-21 DIAGNOSIS — R109 Unspecified abdominal pain: Secondary | ICD-10-CM | POA: Diagnosis not present

## 2021-12-21 DIAGNOSIS — I10 Essential (primary) hypertension: Secondary | ICD-10-CM | POA: Diagnosis not present

## 2021-12-21 DIAGNOSIS — N1832 Chronic kidney disease, stage 3b: Secondary | ICD-10-CM | POA: Diagnosis not present

## 2021-12-21 DIAGNOSIS — E7800 Pure hypercholesterolemia, unspecified: Secondary | ICD-10-CM | POA: Diagnosis not present

## 2021-12-21 DIAGNOSIS — I441 Atrioventricular block, second degree: Secondary | ICD-10-CM | POA: Diagnosis not present

## 2021-12-21 DIAGNOSIS — R7303 Prediabetes: Secondary | ICD-10-CM | POA: Diagnosis not present

## 2021-12-21 DIAGNOSIS — R1013 Epigastric pain: Secondary | ICD-10-CM | POA: Diagnosis not present

## 2021-12-21 DIAGNOSIS — I358 Other nonrheumatic aortic valve disorders: Secondary | ICD-10-CM | POA: Diagnosis not present

## 2022-01-09 ENCOUNTER — Other Ambulatory Visit: Payer: Self-pay | Admitting: Cardiology

## 2022-01-11 NOTE — Telephone Encounter (Signed)
Rx request sent to pharmacy.  

## 2022-01-19 DIAGNOSIS — M7918 Myalgia, other site: Secondary | ICD-10-CM | POA: Diagnosis not present

## 2022-01-21 DIAGNOSIS — H04203 Unspecified epiphora, bilateral lacrimal glands: Secondary | ICD-10-CM | POA: Diagnosis not present

## 2022-01-21 DIAGNOSIS — H02135 Senile ectropion of left lower eyelid: Secondary | ICD-10-CM | POA: Diagnosis not present

## 2022-01-21 DIAGNOSIS — H02132 Senile ectropion of right lower eyelid: Secondary | ICD-10-CM | POA: Diagnosis not present

## 2022-05-24 ENCOUNTER — Telehealth: Payer: Self-pay | Admitting: Cardiology

## 2022-05-24 NOTE — Telephone Encounter (Signed)
I read your last note and see that he has some specific medication requests that you all have discussed previously. Please advise, will likely go across better if recommendation comes from you

## 2022-05-24 NOTE — Telephone Encounter (Signed)
Patient would like to know if it is alright for him to take super beets.

## 2022-05-31 ENCOUNTER — Other Ambulatory Visit: Payer: Self-pay | Admitting: Cardiology

## 2022-05-31 NOTE — Telephone Encounter (Signed)
We don't have much data on this--there is not a risk as far as we know, but we also don't know of any benefits. If he decides to take, just watch for new symptoms or concerns.

## 2022-05-31 NOTE — Telephone Encounter (Signed)
Returned call to patient, no answer, left message with details below- ok per dpr.     "We don't have much data on this--there is not a risk as far as we know, but we also don't know of any benefits. If he decides to take, just watch for new symptoms or concerns."

## 2022-06-01 NOTE — Telephone Encounter (Signed)
Rx request sent to pharmacy.  

## 2022-06-22 DIAGNOSIS — I441 Atrioventricular block, second degree: Secondary | ICD-10-CM | POA: Diagnosis not present

## 2022-06-22 DIAGNOSIS — I358 Other nonrheumatic aortic valve disorders: Secondary | ICD-10-CM | POA: Diagnosis not present

## 2022-06-22 DIAGNOSIS — E7800 Pure hypercholesterolemia, unspecified: Secondary | ICD-10-CM | POA: Diagnosis not present

## 2022-06-22 DIAGNOSIS — R7303 Prediabetes: Secondary | ICD-10-CM | POA: Diagnosis not present

## 2022-06-22 DIAGNOSIS — Z0000 Encounter for general adult medical examination without abnormal findings: Secondary | ICD-10-CM | POA: Diagnosis not present

## 2022-06-22 DIAGNOSIS — I10 Essential (primary) hypertension: Secondary | ICD-10-CM | POA: Diagnosis not present

## 2022-06-22 DIAGNOSIS — N1832 Chronic kidney disease, stage 3b: Secondary | ICD-10-CM | POA: Diagnosis not present

## 2022-06-22 DIAGNOSIS — Z86010 Personal history of colonic polyps: Secondary | ICD-10-CM | POA: Diagnosis not present

## 2022-07-20 ENCOUNTER — Other Ambulatory Visit (HOSPITAL_BASED_OUTPATIENT_CLINIC_OR_DEPARTMENT_OTHER): Payer: Self-pay | Admitting: Cardiology

## 2022-07-21 NOTE — Telephone Encounter (Signed)
Rx(s) sent to pharmacy electronically.  

## 2022-07-28 DIAGNOSIS — Z20822 Contact with and (suspected) exposure to covid-19: Secondary | ICD-10-CM | POA: Diagnosis not present

## 2022-07-28 DIAGNOSIS — Z03818 Encounter for observation for suspected exposure to other biological agents ruled out: Secondary | ICD-10-CM | POA: Diagnosis not present

## 2022-08-19 DIAGNOSIS — H04123 Dry eye syndrome of bilateral lacrimal glands: Secondary | ICD-10-CM | POA: Diagnosis not present

## 2022-08-19 DIAGNOSIS — H35033 Hypertensive retinopathy, bilateral: Secondary | ICD-10-CM | POA: Diagnosis not present

## 2022-08-19 DIAGNOSIS — Z961 Presence of intraocular lens: Secondary | ICD-10-CM | POA: Diagnosis not present

## 2022-08-19 DIAGNOSIS — H02423 Myogenic ptosis of bilateral eyelids: Secondary | ICD-10-CM | POA: Diagnosis not present

## 2022-10-01 ENCOUNTER — Other Ambulatory Visit (HOSPITAL_BASED_OUTPATIENT_CLINIC_OR_DEPARTMENT_OTHER): Payer: Self-pay | Admitting: Cardiology

## 2022-10-26 ENCOUNTER — Other Ambulatory Visit: Payer: Self-pay | Admitting: Cardiology

## 2022-11-18 ENCOUNTER — Other Ambulatory Visit: Payer: Self-pay | Admitting: Cardiology

## 2022-12-14 ENCOUNTER — Other Ambulatory Visit (HOSPITAL_BASED_OUTPATIENT_CLINIC_OR_DEPARTMENT_OTHER): Payer: Self-pay | Admitting: Cardiology

## 2022-12-15 ENCOUNTER — Other Ambulatory Visit (HOSPITAL_BASED_OUTPATIENT_CLINIC_OR_DEPARTMENT_OTHER): Payer: Self-pay | Admitting: *Deleted

## 2022-12-15 MED ORDER — CHLORTHALIDONE 25 MG PO TABS: 25.0000 mg | ORAL_TABLET | Freq: Every day | ORAL | 0 refills | Status: DC

## 2022-12-29 ENCOUNTER — Other Ambulatory Visit (HOSPITAL_BASED_OUTPATIENT_CLINIC_OR_DEPARTMENT_OTHER): Payer: Self-pay | Admitting: Cardiology

## 2022-12-31 DIAGNOSIS — D638 Anemia in other chronic diseases classified elsewhere: Secondary | ICD-10-CM | POA: Diagnosis not present

## 2022-12-31 DIAGNOSIS — R600 Localized edema: Secondary | ICD-10-CM | POA: Diagnosis not present

## 2022-12-31 DIAGNOSIS — E7800 Pure hypercholesterolemia, unspecified: Secondary | ICD-10-CM | POA: Diagnosis not present

## 2022-12-31 DIAGNOSIS — N1832 Chronic kidney disease, stage 3b: Secondary | ICD-10-CM | POA: Diagnosis not present

## 2022-12-31 DIAGNOSIS — I10 Essential (primary) hypertension: Secondary | ICD-10-CM | POA: Diagnosis not present

## 2022-12-31 DIAGNOSIS — I358 Other nonrheumatic aortic valve disorders: Secondary | ICD-10-CM | POA: Diagnosis not present

## 2022-12-31 DIAGNOSIS — R7303 Prediabetes: Secondary | ICD-10-CM | POA: Diagnosis not present

## 2022-12-31 DIAGNOSIS — I441 Atrioventricular block, second degree: Secondary | ICD-10-CM | POA: Diagnosis not present

## 2023-02-08 ENCOUNTER — Encounter (HOSPITAL_BASED_OUTPATIENT_CLINIC_OR_DEPARTMENT_OTHER): Payer: Self-pay | Admitting: Cardiology

## 2023-02-08 ENCOUNTER — Ambulatory Visit (HOSPITAL_BASED_OUTPATIENT_CLINIC_OR_DEPARTMENT_OTHER): Payer: Medicare Other | Admitting: Cardiology

## 2023-02-08 VITALS — BP 168/74 | HR 91 | Ht 66.0 in | Wt 187.9 lb

## 2023-02-08 DIAGNOSIS — E7800 Pure hypercholesterolemia, unspecified: Secondary | ICD-10-CM

## 2023-02-08 DIAGNOSIS — I10 Essential (primary) hypertension: Secondary | ICD-10-CM | POA: Diagnosis not present

## 2023-02-08 DIAGNOSIS — R011 Cardiac murmur, unspecified: Secondary | ICD-10-CM

## 2023-02-08 DIAGNOSIS — I441 Atrioventricular block, second degree: Secondary | ICD-10-CM | POA: Diagnosis not present

## 2023-02-08 NOTE — Patient Instructions (Signed)
 Medication Instructions:  Your physician recommends that you continue on your current medications as directed. Please refer to the Current Medication list given to you today.  *If you need a refill on your cardiac medications before your next appointment, please call your pharmacy*  Lab Work: NONE  Testing/Procedures: NONE  Follow-Up: At [facility], you and your health needs are our priority.  As part of our continuing mission to provide you with exceptional heart care, we have created designated Provider Care Teams.  These Care Teams include your primary Cardiologist (physician) and Advanced Practice Providers (APPs -  Physician Assistants and Nurse Practitioners) who all work together to provide you with the care you need, when you need it.  We recommend signing up for the patient portal called MyChart.  Sign up information is provided on this After Visit Summary.  MyChart is used to connect with patients for Virtual Visits (Telemedicine).  Patients are able to view lab/test results, encounter notes, upcoming appointments, etc.  Non-urgent messages can be sent to your provider as well.   To learn more about what you can do with MyChart, go to [email or web address].    Your next appointment:   3 month(s)  The format for your next appointment:   In Person  Provider:   CAITLIN W NP   6 MONTHS WITH DR LONNI

## 2023-02-08 NOTE — Progress Notes (Signed)
 Cardiology Office Note:  .   Date:  02/08/2023  ID:  Patrick Villa, DOB 1933/06/07, MRN [id number] PCP: Leonel Cole, MD  [facility] Providers Cardiologist:  Shelda Bruckner, MD {  History of Present Illness: .   Patrick Villa is a 89 y.o. male with a hx of COPD, hyperlipidemia, hypertension, second degree AV block type I who is seen in follow up today. I initially saw him as a new consult 01/2018 for the evaluation and management of second degree, type I heart block.    He was seen in the hospital on 01/27/18 and 01/28/18 by Drs. Shlomo armin Shuck for bilateral breast tenderness. At that time, troponins were negative, echo was unremarkable. Cardizem  was stopped due to second degree AV block, type I.    Long term issues have been managing hypertension. He has had several intolerances, see below.   Today: Has not been seen since 12/15/2021. Reports his home numbers are much better at home than in the office. Normally 140s-150s systolic, lowest he has seen is 124. Highest he has seen was 181.  Appetite has been very good. Had sausage cake, chicken drumstick, banana, coffee, orange juice, and 2 slices of bread for breakfast. Wasn't prepared for visit today, called by the office this AM.  ROS: Denies chest pain, shortness of breath at rest or with normal exertion. No PND, orthopnea, LE edema or unexpected weight gain. No syncope or palpitations. ROS otherwise negative except as noted.   Studies Reviewed: SABRA    EKG:  EKG Interpretation Date/Time:  Tuesday February 08 2023 11:44:30 EST Ventricular Rate:  80 PR Interval:    QRS Duration:  126 QT Interval:  404 QTC Calculation: 465 R Axis:   -60  Text Interpretation: Sinus rhythm with wenkebach Left axis deviation Non-specific intra-ventricular conduction block Minimal voltage criteria for LVH, may be normal variant Cannot rule out Septal infarct (cited on or before 06-Feb-2019) Confirmed by Bruckner Shelda [phone]) on 02/08/2023  12:39:31 PM    Physical Exam:   VS:  BP (!) 168/74 (Cuff Size: Large)   Pulse 91   Ht 5' 6 (1.676 m)   Wt 187 lb 14.4 oz (85.2 kg)   SpO2 91%   BMI 30.33 kg/m    Wt Readings from Last 3 Encounters:  02/08/23 187 lb 14.4 oz (85.2 kg)  12/15/21 192 lb 9.6 oz (87.4 kg)  10/01/21 188 lb 12.8 oz (85.6 kg)    GEN: Well nourished, well developed in no acute distress HEENT: Normal, moist mucous membranes NECK: No JVD CARDIAC: regular rhythm, normal S1 and S2, no rubs or gallops. 1/6 systolic murmur. VASCULAR: Radial and DP pulses 2+ bilaterally. No carotid bruits RESPIRATORY:  Clear to auscultation without rales, wheezing or rhonchi  ABDOMEN: Soft, non-tender, non-distended MUSCULOSKELETAL:  Ambulates independently SKIN: Warm and dry, no edema NEUROLOGIC:  Alert and oriented x 3. No focal neuro deficits noted. PSYCHIATRIC:  Normal affect    ASSESSMENT AND PLAN: .    Hypertension: -he does not have log today, reciting from memory -he has strong personal beliefs about his medications, feels poorly when bp is near guideline goals (he reports this as low for him).  -improved on recheck but not near goal -discussed today. He declines changes today. He will monitor his BP at home and bring his log and cuff to his next appt   History of antihypertensives -tried amlodipine , had jitteriness/tremors.  -Had chest wall pain on spironolactone.  -no diltiazem /verapamil, beta blocker due  to second degree AV block type 1 (Wenkebach) -had aches/balance issues on chlorthalidone , but now tolerating -had worsening chest wall pain on >50 mg hydralazine . Encouraged him to try to take 50 mg BID or at least 50 mg one dose and 25 mg dose the other.   Aspirin  use: reviewed indications/recommendations (not recommended for him based on current guidelines), but he wishes to continue.   Murmur: aortic sclerosis without stenosis, no further workup needed. Echo 01/2018   History of chest discomfort/tenderness:  longstanding, atypical. Lexiscan  normal. Mammogram normal.   Second degree AV block, type I: no indication for pacemaker   CV risk counseling and prevention -recommend heart healthy/Mediterranean diet, with whole grains, fruits, vegetable, fish, lean meats, nuts, and olive oil. Limit salt. -recommend moderate walking, 3-5 times/week for 30-50 minutes each session. Aim for at least 150 minutes.week. Goal should be pace of 3 miles/hours, or walking 1.5 miles in 30 minutes -recommend avoidance of tobacco products. Avoid excess alcohol.  Dispo: 3 mos with Reche Finder, NP and 6 months with me  Signed, Shelda Bruckner, MD   Shelda Bruckner, MD, PhD, [facility]  [facility]    Heart & Vascular at [facility] at [facility] [address], [address] [place], [place] [address] [phone]

## 2023-03-29 ENCOUNTER — Ambulatory Visit (HOSPITAL_BASED_OUTPATIENT_CLINIC_OR_DEPARTMENT_OTHER): Payer: Medicare Other | Admitting: Cardiology

## 2023-04-23 DIAGNOSIS — J069 Acute upper respiratory infection, unspecified: Secondary | ICD-10-CM | POA: Diagnosis not present

## 2023-04-23 DIAGNOSIS — R509 Fever, unspecified: Secondary | ICD-10-CM | POA: Diagnosis not present

## 2023-04-23 DIAGNOSIS — R051 Acute cough: Secondary | ICD-10-CM | POA: Diagnosis not present

## 2023-04-26 ENCOUNTER — Other Ambulatory Visit: Payer: Self-pay

## 2023-04-26 ENCOUNTER — Encounter (HOSPITAL_BASED_OUTPATIENT_CLINIC_OR_DEPARTMENT_OTHER): Payer: Self-pay

## 2023-04-26 ENCOUNTER — Emergency Department (HOSPITAL_BASED_OUTPATIENT_CLINIC_OR_DEPARTMENT_OTHER): Admitting: Radiology

## 2023-04-26 ENCOUNTER — Emergency Department (HOSPITAL_BASED_OUTPATIENT_CLINIC_OR_DEPARTMENT_OTHER)
Admission: EM | Admit: 2023-04-26 | Discharge: 2023-04-26 | Disposition: A | Discharge status: Home / Self Care | Attending: Emergency Medicine | Admitting: Emergency Medicine

## 2023-04-26 ENCOUNTER — Emergency Department (HOSPITAL_BASED_OUTPATIENT_CLINIC_OR_DEPARTMENT_OTHER)

## 2023-04-26 DIAGNOSIS — E876 Hypokalemia: Secondary | ICD-10-CM | POA: Insufficient documentation

## 2023-04-26 DIAGNOSIS — Z7982 Long term (current) use of aspirin: Secondary | ICD-10-CM | POA: Diagnosis not present

## 2023-04-26 DIAGNOSIS — I2510 Atherosclerotic heart disease of native coronary artery without angina pectoris: Secondary | ICD-10-CM | POA: Diagnosis not present

## 2023-04-26 DIAGNOSIS — I10 Essential (primary) hypertension: Secondary | ICD-10-CM | POA: Diagnosis not present

## 2023-04-26 DIAGNOSIS — I700 Atherosclerosis of aorta: Secondary | ICD-10-CM | POA: Diagnosis not present

## 2023-04-26 DIAGNOSIS — J189 Pneumonia, unspecified organism: Secondary | ICD-10-CM

## 2023-04-26 DIAGNOSIS — E119 Type 2 diabetes mellitus without complications: Secondary | ICD-10-CM | POA: Diagnosis not present

## 2023-04-26 DIAGNOSIS — J449 Chronic obstructive pulmonary disease, unspecified: Secondary | ICD-10-CM | POA: Insufficient documentation

## 2023-04-26 DIAGNOSIS — R0602 Shortness of breath: Secondary | ICD-10-CM | POA: Diagnosis not present

## 2023-04-26 DIAGNOSIS — I517 Cardiomegaly: Secondary | ICD-10-CM | POA: Diagnosis not present

## 2023-04-26 DIAGNOSIS — J181 Lobar pneumonia, unspecified organism: Secondary | ICD-10-CM | POA: Insufficient documentation

## 2023-04-26 DIAGNOSIS — R918 Other nonspecific abnormal finding of lung field: Secondary | ICD-10-CM | POA: Diagnosis not present

## 2023-04-26 LAB — RESP PANEL BY RT-PCR (RSV, FLU A&B, COVID)  RVPGX2
Influenza A by PCR: NEGATIVE
Influenza B by PCR: NEGATIVE
Resp Syncytial Virus by PCR: NEGATIVE
SARS Coronavirus 2 by RT PCR: NEGATIVE

## 2023-04-26 LAB — CBC WITH DIFFERENTIAL/PLATELET
Abs Immature Granulocytes: 0.01 10*3/uL (ref 0.00–0.07)
Basophils Absolute: 0 10*3/uL (ref 0.0–0.1)
Basophils Relative: 0 %
Eosinophils Absolute: 0 10*3/uL (ref 0.0–0.5)
Eosinophils Relative: 1 %
HCT: 36.8 % — ABNORMAL LOW (ref 39.0–52.0)
Hemoglobin: 11.9 g/dL — ABNORMAL LOW (ref 13.0–17.0)
Immature Granulocytes: 0 %
Lymphocytes Relative: 13 %
Lymphs Abs: 0.9 10*3/uL (ref 0.7–4.0)
MCH: 27.2 pg (ref 26.0–34.0)
MCHC: 32.3 g/dL (ref 30.0–36.0)
MCV: 84 fL (ref 80.0–100.0)
Monocytes Absolute: 0.7 10*3/uL (ref 0.1–1.0)
Monocytes Relative: 9 %
Neutro Abs: 5.4 10*3/uL (ref 1.7–7.7)
Neutrophils Relative %: 77 %
Platelets: 246 10*3/uL (ref 150–400)
RBC: 4.38 MIL/uL (ref 4.22–5.81)
RDW: 13.1 % (ref 11.5–15.5)
WBC: 7 10*3/uL (ref 4.0–10.5)
nRBC: 0 % (ref 0.0–0.2)

## 2023-04-26 LAB — COMPREHENSIVE METABOLIC PANEL WITH GFR
ALT: 18 U/L (ref 0–44)
AST: 28 U/L (ref 15–41)
Albumin: 3.5 g/dL (ref 3.5–5.0)
Alkaline Phosphatase: 34 U/L — ABNORMAL LOW (ref 38–126)
Anion gap: 9 (ref 5–15)
BUN: 22 mg/dL (ref 8–23)
CO2: 28 mmol/L (ref 22–32)
Calcium: 9.5 mg/dL (ref 8.9–10.3)
Chloride: 100 mmol/L (ref 98–111)
Creatinine, Ser: 1.24 mg/dL (ref 0.61–1.24)
GFR, Estimated: 56 mL/min — ABNORMAL LOW (ref >60)
Glucose, Bld: 97 mg/dL (ref 70–99)
Potassium: 3.3 mmol/L — ABNORMAL LOW (ref 3.5–5.1)
Sodium: 137 mmol/L (ref 135–145)
Total Bilirubin: 0.6 mg/dL (ref 0.0–1.2)
Total Protein: 6.9 g/dL (ref 6.5–8.1)

## 2023-04-26 LAB — TROPONIN I (HIGH SENSITIVITY)
Troponin I (High Sensitivity): 67 ng/L — ABNORMAL HIGH (ref <18)
Troponin I (High Sensitivity): 75 ng/L — ABNORMAL HIGH

## 2023-04-26 LAB — BRAIN NATRIURETIC PEPTIDE: B Natriuretic Peptide: 430.4 pg/mL — ABNORMAL HIGH (ref 0.0–100.0)

## 2023-04-26 LAB — D-DIMER, QUANTITATIVE: D-Dimer, Quant: 4.18 ug{FEU}/mL — ABNORMAL HIGH (ref 0.00–0.50)

## 2023-04-26 MED ORDER — DOXYCYCLINE HYCLATE 100 MG PO TABS
100.0000 mg | ORAL_TABLET | Freq: Once | ORAL | Status: AC
  Administered 2023-04-26: 100 mg via ORAL
  Filled 2023-04-26: qty 1

## 2023-04-26 MED ORDER — AMOXICILLIN-POT CLAVULANATE 875-125 MG PO TABS
1.0000 | ORAL_TABLET | Freq: Once | ORAL | Status: AC
  Administered 2023-04-26: 1 via ORAL
  Filled 2023-04-26: qty 1

## 2023-04-26 MED ORDER — DOXYCYCLINE HYCLATE 100 MG PO CAPS: 100.0000 mg | ORAL_CAPSULE | Freq: Two times a day (BID) | ORAL | 0 refills | Status: DC

## 2023-04-26 MED ORDER — POTASSIUM CHLORIDE 20 MEQ PO PACK
40.0000 meq | PACK | Freq: Once | ORAL | Status: AC
  Administered 2023-04-26: 40 meq via ORAL
  Filled 2023-04-26: qty 2

## 2023-04-26 MED ORDER — IOHEXOL 350 MG/ML SOLN
100.0000 mL | Freq: Once | INTRAVENOUS | Status: AC | PRN
  Administered 2023-04-26: 75 mL via INTRAVENOUS

## 2023-04-26 MED ORDER — AMOXICILLIN-POT CLAVULANATE 875-125 MG PO TABS: 1.0000 | ORAL_TABLET | Freq: Two times a day (BID) | ORAL | 0 refills | Status: DC

## 2023-04-26 NOTE — ED Notes (Signed)
 Lab need LT green redraw, RN heather notified

## 2023-04-26 NOTE — ED Notes (Signed)
 Pt given discharge instructions and reviewed prescriptions. Opportunities given for questions. Pt verbalizes understanding. PIV removed x1. Jillyn Hidden, RN

## 2023-04-26 NOTE — ED Triage Notes (Signed)
 Pt POV from home w/ wife, c/o SOB since last week, denies cough, allergies. States he was bitten by a tick approx 2.5 weeks ago, also c/o weakness, muscle soreness, intermittent fevers.

## 2023-04-26 NOTE — Discharge Instructions (Addendum)
 It appears that you have a pneumonia on your CT and chest x-ray today.  You have been started on 2 antibiotics to help treat your pneumonia, Augmentin and doxycycline.  You were given the first dose of both these medications here today.  Please continue to take these medications twice a day for the next 7 days starting tomorrow morning.  Antibiotics can cause you to have diarrhea, but this should resolve after stopping the antibiotic.  As discussed, your symptoms may also be due to the tick bite.  The antibiotic doxycycline also treats for potential [facility] spotted fever which is a common tickborne illness in [place].  Your test for this test been sent off to the lab, and you should see the results in your MyChart portal in the next 2 or 3 days.  There were no signs of a blood clot in your lungs.  You had a slightly low potassium level today.  You are given a potassium supplement.  Please increase your dietary intake of potassium at home with foods such as bananas, avocados, potatoes.  Please have your PCP monitor this value.  Your other electrolytes, kidney, and liver function tests were normal today.  Your COVID, flu, and RSV test were negative today.  You had a slightly low hemoglobin value which is one of your blood counts.  Please have your PCP monitor this value.  Your cardiac enzyme (troponin) was elevated but stable today.  Your EKG which is a measure of the heart's electrical activity and rhythm is normal today.  At this time, it appears your symptoms are not due to a problem in your heart.  However, please keep a very close eye on your symptoms.  If you develop any chest pain, shortness of breath, or any other new or concerning symptoms, please return immediately to the emergency room.   Please follow-up with your PCP within the next week for recheck of your symptoms. Your blood pressure was also elevated here today.  Please have your PCP recheck this at your next visit.

## 2023-04-26 NOTE — ED Provider Notes (Signed)
 [place] EMERGENCY DEPARTMENT AT [facility] Provider Note   CSN: [id number] Arrival date & time: 04/26/23  1226     History  Chief Complaint  Patient presents with   Shortness of Breath    Patrick Villa is a 89 y.o. male with history of hypertension, diabetes, COPD, systolic murmur, presents with concern for generalized bodyaches, shortness of breath, and intermittent subjective fever and chills at home over the past 2 weeks.  He denies any sore throat, cough, rashes, congestion. Denies chest pain. Patient states he was bitten by a tick about 2 weeks ago, he removed the tick by himself.  The symptoms started after he was bitten by the tick.   Shortness of Breath      Home Medications Prior to Admission medications   Medication Sig Start Date End Date Taking? Authorizing Provider  amoxicillin-clavulanate (AUGMENTIN) 875-125 MG tablet Take 1 tablet by mouth every 12 (twelve) hours. 04/27/23  Yes Arabella Merles, PA-C  doxycycline (VIBRAMYCIN) 100 MG capsule Take 1 capsule (100 mg total) by mouth 2 (two) times daily. 04/27/23  Yes Arabella Merles, PA-C  aspirin 325 MG tablet Take 325 mg by mouth daily.    [provider]  chlorthalidone (HYGROTON) 25 MG tablet Take 1 tablet (25 mg total) by mouth daily. 2nd attempt. Pt needs yearly appt for any future refills. Please call office to schedule appt @ [phone]. 12/30/22   Jodelle Red, MD  Cholecalciferol (VITAMIN D) 2000 UNITS CAPS Take 1 capsule (2,000 Units total) by mouth daily. 07/17/14   Dorena Bodo, PA-C  diclofenac (VOLTAREN) 75 MG EC tablet Take 1 tablet (75 mg total) by mouth 2 (two) times daily as needed. Take with food 11/05/21   Cristie Hem, PA-C  finasteride (PROSCAR) 5 MG tablet Take 1 tablet (5 mg total) by mouth daily. 02/25/21   Valentino Nose, NP  hydrALAZINE (APRESOLINE) 50 MG tablet TAKE 1 TABLET BY MOUTH 3 TIMES  DAILY 11/18/22   Jodelle Red, MD  terazosin (HYTRIN)  5 MG capsule Take 5 mg by mouth at bedtime.    [provider]      Allergies    Amlodipine, Lipitor [atorvastatin calcium], Zocor [simvastatin], Bactrim [sulfamethoxazole-trimethoprim], and Pravachol    Review of Systems   Review of Systems  Respiratory:  Positive for shortness of breath.     Physical Exam Updated Vital Signs BP (!) 168/80 (BP Location: Right Arm)   Pulse 90   Temp 100 F (37.8 C) (Oral)   Resp 20   Ht 5\' 6"  (1.676 m)   Wt 83.5 kg   SpO2 94%   BMI 29.70 kg/m  Physical Exam Vitals and nursing note reviewed.  Constitutional:      General: He is not in acute distress.    Appearance: He is well-developed.  HENT:     Head: Normocephalic and atraumatic.     Right Ear: Tympanic membrane normal.     Left Ear: Tympanic membrane normal.  Eyes:     Conjunctiva/sclera: Conjunctivae normal.  Cardiovascular:     Rate and Rhythm: Normal rate and regular rhythm.     Heart sounds: Murmur heard.     Comments: Radial pulse 2+ bilaterally Pulmonary:     Effort: Pulmonary effort is normal. No respiratory distress.     Breath sounds: Normal breath sounds.     Comments: Breathing comfortably on room air, talking in full sentences Abdominal:     Palpations: Abdomen is soft.  Tenderness: There is no abdominal tenderness.  Musculoskeletal:        General: No swelling.     Cervical back: Neck supple.     Comments: No lower extremity edema bilaterally  Able to move all extremities without difficulty  Skin:    General: Skin is warm and dry.     Capillary Refill: Capillary refill takes less than 2 seconds.     Comments: No rash appreciated on the arms, legs, or torso.  Neurological:     Mental Status: He is alert.  Psychiatric:        Mood and Affect: Mood normal.     ED Results / Procedures / Treatments   Labs (all labs ordered are listed, but only abnormal results are displayed) Labs Reviewed  CBC WITH DIFFERENTIAL/PLATELET - Abnormal; Notable for  the following components:      Result Value   Hemoglobin 11.9 (*)    HCT 36.8 (*)    All other components within normal limits  D-DIMER, QUANTITATIVE - Abnormal; Notable for the following components:   D-Dimer, Quant 4.18 (*)    All other components within normal limits  BRAIN NATRIURETIC PEPTIDE - Abnormal; Notable for the following components:   B Natriuretic Peptide 430.4 (*)    All other components within normal limits  COMPREHENSIVE METABOLIC PANEL WITH GFR - Abnormal; Notable for the following components:   Potassium 3.3 (*)    Alkaline Phosphatase 34 (*)    GFR, Estimated 56 (*)    All other components within normal limits  TROPONIN I (HIGH SENSITIVITY) - Abnormal; Notable for the following components:   Troponin I (High Sensitivity) 67 (*)    All other components within normal limits  TROPONIN I (HIGH SENSITIVITY) - Abnormal; Notable for the following components:   Troponin I (High Sensitivity) 75 (*)    All other components within normal limits  RESP PANEL BY RT-PCR (RSV, FLU A&B, COVID)  RVPGX2  ROCKY MTN SPOTTED FVR ABS PNL(IGG+IGM)    EKG EKG Interpretation Date/Time:  Tuesday April 26 2023 14:03:18 EDT Ventricular Rate:  87 PR Interval:  322 QRS Duration:  136 QT Interval:  434 QTC Calculation: 523 R Axis:   -83  Text Interpretation: Sinus rhythm Prolonged PR interval Nonspecific IVCD with LAD Anterior infarct, old Confirmed by Vonita Moss [phone]) on 04/26/2023 3:58:25 PM  Radiology CT Angio Chest PE W and/or Wo Contrast Result Date: 04/26/2023 CLINICAL DATA:  Pulmonary embolism (PE) suspected, low to intermediate prob, positive D-dimer. Shortness of breath. EXAM: CT ANGIOGRAPHY CHEST WITH CONTRAST TECHNIQUE: Multidetector CT imaging of the chest was performed using the standard protocol during bolus administration of intravenous contrast. Multiplanar CT image reconstructions and MIPs were obtained to evaluate the vascular anatomy. RADIATION DOSE REDUCTION: This  exam was performed according to the departmental dose-optimization program which includes automated exposure control, adjustment of the mA and/or kV according to patient size and/or use of iterative reconstruction technique. CONTRAST:  75mL OMNIPAQUE IOHEXOL 350 MG/ML SOLN COMPARISON:  None Available. FINDINGS: Cardiovascular: No evidence of embolism to the proximal subsegmental pulmonary artery level. Mild cardiomegaly. No pericardial effusion. No aortic aneurysm. There are coronary artery calcifications, in keeping with coronary artery disease. There are also mild peripheral atherosclerotic vascular calcifications of thoracic aorta and its major branches. Mediastinum/Nodes: Visualized thyroid gland appears grossly unremarkable. No solid / cystic mediastinal masses. The esophagus is nondistended precluding optimal assessment. No axillary, mediastinal or hilar lymphadenopathy by size criteria. Lungs/Pleura: The central tracheo-bronchial tree is patent.  There are patchy ground-glass opacities throughout bilateral lungs with associated areas of smooth inter lobular septal thickening (crazy paving pattern). Findings are nonspecific and differential diagnosis includes multilobar pneumonia, pulmonary edema, ARDS, pulmonary alveolar proteinosis, etc. Correlate clinically. No pleural effusion or pneumothorax. No mass or consolidation. Upper Abdomen: Visualized upper abdominal viscera within normal limits. Musculoskeletal: The visualized soft tissues of the chest wall are grossly unremarkable. No suspicious osseous lesions. There are mild multilevel degenerative changes in the visualized spine. Review of the MIP images confirms the above findings. IMPRESSION: 1. No embolism to the proximal subsegmental pulmonary artery level. 2. There are patchy ground-glass opacities throughout bilateral lungs with associated areas of smooth inter lobular septal thickening. Findings are nonspecific and differential diagnosis includes  multilobar pneumonia, pulmonary edema, ARDS, pulmonary alveolar proteinosis, etc. 3. Multiple other nonacute observations, as described above. Aortic Atherosclerosis (ICD10-I70.0). Electronically Signed   By: Jules Schick M.D.   On: 04/26/2023 16:52   DG Chest 2 View Result Date: 04/26/2023 CLINICAL DATA:  One-week history of shortness of breath EXAM: CHEST - 2 VIEW COMPARISON:  Chest radiograph dated 02/02/2019 FINDINGS: Normal lung volumes. Multifocal bilateral nodular and confluent opacities. No pleural effusion or pneumothorax. Enlarged cardiomediastinal silhouette. No acute osseous abnormality. IMPRESSION: 1. Multifocal bilateral nodular and confluent opacities, suspicious for multifocal pneumonia. 2. Cardiomegaly. Electronically Signed   By: Agustin Cree M.D.   On: 04/26/2023 15:28    Procedures Procedures    Medications Ordered in ED Medications  iohexol (OMNIPAQUE) 350 MG/ML injection 100 mL (75 mLs Intravenous Contrast Given 04/26/23 1520)  doxycycline (VIBRA-TABS) tablet 100 mg (100 mg Oral Given 04/26/23 1802)  amoxicillin-clavulanate (AUGMENTIN) 875-125 MG per tablet 1 tablet (1 tablet Oral Given 04/26/23 1802)  potassium chloride (KLOR-CON) packet 40 mEq (40 mEq Oral Given 04/26/23 1802)    ED Course/ Medical Decision Making/ A&P                                 Medical Decision Making Amount and/or Complexity of Data Reviewed Labs: ordered. Radiology: ordered.  Risk Prescription drug management.     Differential diagnosis includes but is not limited to URI, DVT/PE, ACS, pneumonia, recommended spotted fever, Lyme disease, COPD exacerbation, CHF exacerbation  ED Course:  Patient is well-appearing, stable vital signs aside from a slightly elevated blood pressure 160/80.  Temperature of 100.1.  Talking comfortably on room air, lungs clear to auscultation bilaterally.  He does not have any visible rash.  He has been reporting generalized shortness of breath, chills, fever, and  bodyaches since this tick bite 2 weeks ago. I Ordered, and personally interpreted labs.  The pertinent results include:   CBC without leukocytosis.  Mild anemia with hemoglobin 11.9, unsure what recent baseline is CMP with hypokalemia at 3.3.  Otherwise no other electrolyte abnormalities, no elevation in LFTs or creatinine. D-dimer elevated at 4.18 Initial troponin of 67, repeat of 75 COVID, flu, RSV negative [facility] spotted fever lab pending I independently visualized the imaging with scope of interpretation limited to determining acute life threatening conditions related to emergency care. Imaging showed  Chest x-ray with multifocal bilateral nodular and confluent opacities, suspicious for multifocal pneumonia CT angio chest without sign of PE.  Patchy conquest opacities throughout the bilateral lungs noted Given patient denies any chest pain, troponins remaining stable although mildly elevated, and EKG without any ST elevations, low concern for ACS at this time.  Patient with  elevated BNP at 430, but unsure what baseline is.  He does not have any lower extremity edema or pulmonary edema on imaging to suggest CHF. Symptoms can be explained by finding of pneumonia on his chest x-ray and CT scan.  Given symptoms also started after tick bite, could consider [facility] spotted fever.  Will treat for both of these with Augmentin and doxycycline. First dose given here today. My attending Dr. Jarold Motto also personally saw and evaluated patient and agrees with plan of care to discharge home on antibiotics. We do not feel patient needs any further workup or hospitalization at this time. Patient stable and appropriate for discharge home  Cardiac Monitoring: / EKG: The patient was maintained on a cardiac monitor.  I personally viewed and interpreted the cardiac monitored which showed an underlying rhythm of: Normal sinus rhythm    Impression: Multilobar pneumonia History of tick bite, possible  RMSF Hypokalemia  Disposition:  The patient was discharged home with instructions to take 7-day course of Augmentin and doxycycline as prescribed.  Follow-up with PCP within the next week for recheck of his symptoms. Return precautions given.   This chart was dictated using voice recognition software, Dragon. Despite the best efforts of this provider to proofread and correct errors, errors may still occur which can change documentation meaning.          Final Clinical Impression(s) / ED Diagnoses Final diagnoses:  Pneumonia of both lower lobes due to infectious organism  Hypokalemia    Rx / DC Orders ED Discharge Orders          Ordered    amoxicillin-clavulanate (AUGMENTIN) 875-125 MG tablet  Every 12 hours        04/26/23 1801    doxycycline (VIBRAMYCIN) 100 MG capsule  2 times daily        04/26/23 1801              Arabella Merles, PA-C 04/26/23 1816    Rondel Baton, MD 04/27/23 1416

## 2023-04-27 ENCOUNTER — Ambulatory Visit: Payer: Self-pay

## 2023-04-27 DIAGNOSIS — I517 Cardiomegaly: Secondary | ICD-10-CM | POA: Diagnosis not present

## 2023-04-27 NOTE — Telephone Encounter (Signed)
  This RN made third attempt to contact pt with no answer. A voicemail was left with call back number provided.

## 2023-04-27 NOTE — Telephone Encounter (Signed)
 Patient's daughter was  called, no answer, left VM to return the call to the office to speak to NT.   Copied from CRM [id number]. Topic: Clinical - Medical Advice >> Apr 27, 2023  3:10 PM Ja-Kwan M wrote: Reason for CRM: Patient's daughter Roney Marion stated that patient was prescribed an antibiotic and told if he did not get any better to come back in. Eber Jones stated that she is concerned because patient is experiencing chills along with the cough and congestion. Eber Jones requests call back to discuss if patient should be taken back to the hospital. Call back# [phone]

## 2023-04-27 NOTE — Telephone Encounter (Signed)
 2nd attempt to contact caller. Mailbox full at this time unable to leave voicemail. Place in callback for 3rd and final attempt. Copied from CRM [id number]. Topic: Clinical - Medical Advice >> Apr 27, 2023  3:10 PM Ja-Kwan M wrote: Reason for CRM: Patient's daughter Roney Marion stated that patient was prescribed an antibiotic and told if he did not get any better to come back in. Eber Jones stated that she is concerned because patient is experiencing chills along with the cough and congestion. Eber Jones requests call back to discuss if patient should be taken back to the hospital. Call back# [phone]

## 2023-04-29 ENCOUNTER — Encounter (HOSPITAL_BASED_OUTPATIENT_CLINIC_OR_DEPARTMENT_OTHER): Payer: Self-pay | Admitting: Emergency Medicine

## 2023-04-30 ENCOUNTER — Emergency Department (HOSPITAL_BASED_OUTPATIENT_CLINIC_OR_DEPARTMENT_OTHER)

## 2023-04-30 ENCOUNTER — Inpatient Hospital Stay (HOSPITAL_BASED_OUTPATIENT_CLINIC_OR_DEPARTMENT_OTHER)
Admission: EM | Admit: 2023-04-30 | Discharge: 2023-05-26 | DRG: 207 | Disposition: E | Attending: Pulmonary Disease | Admitting: Pulmonary Disease

## 2023-04-30 ENCOUNTER — Other Ambulatory Visit: Payer: Self-pay

## 2023-04-30 ENCOUNTER — Encounter (HOSPITAL_BASED_OUTPATIENT_CLINIC_OR_DEPARTMENT_OTHER): Payer: Self-pay

## 2023-04-30 DIAGNOSIS — E876 Hypokalemia: Secondary | ICD-10-CM | POA: Diagnosis not present

## 2023-04-30 DIAGNOSIS — Z6824 Body mass index (BMI) 24.0-24.9, adult: Secondary | ICD-10-CM

## 2023-04-30 DIAGNOSIS — I2510 Atherosclerotic heart disease of native coronary artery without angina pectoris: Secondary | ICD-10-CM | POA: Diagnosis not present

## 2023-04-30 DIAGNOSIS — G9340 Encephalopathy, unspecified: Secondary | ICD-10-CM | POA: Diagnosis not present

## 2023-04-30 DIAGNOSIS — E1165 Type 2 diabetes mellitus with hyperglycemia: Secondary | ICD-10-CM | POA: Diagnosis not present

## 2023-04-30 DIAGNOSIS — I5021 Acute systolic (congestive) heart failure: Secondary | ICD-10-CM | POA: Diagnosis not present

## 2023-04-30 DIAGNOSIS — E785 Hyperlipidemia, unspecified: Secondary | ICD-10-CM | POA: Diagnosis present

## 2023-04-30 DIAGNOSIS — I469 Cardiac arrest, cause unspecified: Secondary | ICD-10-CM | POA: Diagnosis not present

## 2023-04-30 DIAGNOSIS — A419 Sepsis, unspecified organism: Principal | ICD-10-CM | POA: Diagnosis present

## 2023-04-30 DIAGNOSIS — N400 Enlarged prostate without lower urinary tract symptoms: Secondary | ICD-10-CM | POA: Diagnosis present

## 2023-04-30 DIAGNOSIS — E872 Acidosis, unspecified: Secondary | ICD-10-CM | POA: Diagnosis not present

## 2023-04-30 DIAGNOSIS — I441 Atrioventricular block, second degree: Secondary | ICD-10-CM | POA: Diagnosis not present

## 2023-04-30 DIAGNOSIS — E870 Hyperosmolality and hypernatremia: Secondary | ICD-10-CM | POA: Diagnosis not present

## 2023-04-30 DIAGNOSIS — D649 Anemia, unspecified: Secondary | ICD-10-CM | POA: Diagnosis present

## 2023-04-30 DIAGNOSIS — R918 Other nonspecific abnormal finding of lung field: Secondary | ICD-10-CM | POA: Diagnosis not present

## 2023-04-30 DIAGNOSIS — Z881 Allergy status to other antibiotic agents status: Secondary | ICD-10-CM

## 2023-04-30 DIAGNOSIS — I493 Ventricular premature depolarization: Secondary | ICD-10-CM | POA: Diagnosis not present

## 2023-04-30 DIAGNOSIS — R9389 Abnormal findings on diagnostic imaging of other specified body structures: Secondary | ICD-10-CM | POA: Diagnosis not present

## 2023-04-30 DIAGNOSIS — J189 Pneumonia, unspecified organism: Secondary | ICD-10-CM | POA: Diagnosis not present

## 2023-04-30 DIAGNOSIS — I110 Hypertensive heart disease with heart failure: Secondary | ICD-10-CM | POA: Diagnosis not present

## 2023-04-30 DIAGNOSIS — R579 Shock, unspecified: Secondary | ICD-10-CM | POA: Diagnosis not present

## 2023-04-30 DIAGNOSIS — R0902 Hypoxemia: Secondary | ICD-10-CM | POA: Diagnosis not present

## 2023-04-30 DIAGNOSIS — Z1152 Encounter for screening for COVID-19: Secondary | ICD-10-CM | POA: Diagnosis not present

## 2023-04-30 DIAGNOSIS — I5020 Unspecified systolic (congestive) heart failure: Secondary | ICD-10-CM | POA: Diagnosis not present

## 2023-04-30 DIAGNOSIS — Z79899 Other long term (current) drug therapy: Secondary | ICD-10-CM

## 2023-04-30 DIAGNOSIS — I700 Atherosclerosis of aorta: Secondary | ICD-10-CM

## 2023-04-30 DIAGNOSIS — Z8679 Personal history of other diseases of the circulatory system: Secondary | ICD-10-CM | POA: Diagnosis not present

## 2023-04-30 DIAGNOSIS — E440 Moderate protein-calorie malnutrition: Secondary | ICD-10-CM | POA: Diagnosis not present

## 2023-04-30 DIAGNOSIS — R9431 Abnormal electrocardiogram [ECG] [EKG]: Secondary | ICD-10-CM | POA: Diagnosis not present

## 2023-04-30 DIAGNOSIS — I429 Cardiomyopathy, unspecified: Secondary | ICD-10-CM | POA: Diagnosis not present

## 2023-04-30 DIAGNOSIS — R739 Hyperglycemia, unspecified: Secondary | ICD-10-CM | POA: Diagnosis not present

## 2023-04-30 DIAGNOSIS — E11649 Type 2 diabetes mellitus with hypoglycemia without coma: Secondary | ICD-10-CM | POA: Diagnosis not present

## 2023-04-30 DIAGNOSIS — N401 Enlarged prostate with lower urinary tract symptoms: Secondary | ICD-10-CM | POA: Diagnosis not present

## 2023-04-30 DIAGNOSIS — Z87891 Personal history of nicotine dependence: Secondary | ICD-10-CM

## 2023-04-30 DIAGNOSIS — N179 Acute kidney failure, unspecified: Secondary | ICD-10-CM | POA: Diagnosis present

## 2023-04-30 DIAGNOSIS — I10 Essential (primary) hypertension: Secondary | ICD-10-CM | POA: Diagnosis present

## 2023-04-30 DIAGNOSIS — F05 Delirium due to known physiological condition: Secondary | ICD-10-CM | POA: Diagnosis not present

## 2023-04-30 DIAGNOSIS — J90 Pleural effusion, not elsewhere classified: Secondary | ICD-10-CM | POA: Diagnosis not present

## 2023-04-30 DIAGNOSIS — J449 Chronic obstructive pulmonary disease, unspecified: Secondary | ICD-10-CM | POA: Diagnosis present

## 2023-04-30 DIAGNOSIS — J440 Chronic obstructive pulmonary disease with acute lower respiratory infection: Secondary | ICD-10-CM | POA: Diagnosis present

## 2023-04-30 DIAGNOSIS — Z7982 Long term (current) use of aspirin: Secondary | ICD-10-CM

## 2023-04-30 DIAGNOSIS — J9601 Acute respiratory failure with hypoxia: Secondary | ICD-10-CM | POA: Diagnosis present

## 2023-04-30 DIAGNOSIS — T380X5A Adverse effect of glucocorticoids and synthetic analogues, initial encounter: Secondary | ICD-10-CM | POA: Diagnosis not present

## 2023-04-30 DIAGNOSIS — E162 Hypoglycemia, unspecified: Secondary | ICD-10-CM | POA: Diagnosis not present

## 2023-04-30 DIAGNOSIS — E875 Hyperkalemia: Secondary | ICD-10-CM | POA: Diagnosis not present

## 2023-04-30 LAB — COMPREHENSIVE METABOLIC PANEL WITH GFR
ALT: 29 U/L (ref 0–44)
AST: 50 U/L — ABNORMAL HIGH (ref 15–41)
Albumin: 3.4 g/dL — ABNORMAL LOW (ref 3.5–5.0)
Alkaline Phosphatase: 52 U/L (ref 38–126)
Anion gap: 11 (ref 5–15)
BUN: 24 mg/dL — ABNORMAL HIGH (ref 8–23)
CO2: 26 mmol/L (ref 22–32)
Calcium: 9.9 mg/dL (ref 8.9–10.3)
Chloride: 101 mmol/L (ref 98–111)
Creatinine, Ser: 1.52 mg/dL — ABNORMAL HIGH (ref 0.61–1.24)
GFR, Estimated: 44 mL/min — ABNORMAL LOW (ref >60)
Glucose, Bld: 110 mg/dL — ABNORMAL HIGH (ref 70–99)
Potassium: 3.1 mmol/L — ABNORMAL LOW (ref 3.5–5.1)
Sodium: 138 mmol/L (ref 135–145)
Total Bilirubin: 0.4 mg/dL (ref 0.0–1.2)
Total Protein: 7 g/dL (ref 6.5–8.1)

## 2023-04-30 LAB — CBC WITH DIFFERENTIAL/PLATELET
Abs Immature Granulocytes: 0.05 10*3/uL (ref 0.00–0.07)
Basophils Absolute: 0 10*3/uL (ref 0.0–0.1)
Basophils Relative: 1 %
Eosinophils Absolute: 0 10*3/uL (ref 0.0–0.5)
Eosinophils Relative: 1 %
HCT: 34 % — ABNORMAL LOW (ref 39.0–52.0)
Hemoglobin: 10.9 g/dL — ABNORMAL LOW (ref 13.0–17.0)
Immature Granulocytes: 1 %
Lymphocytes Relative: 10 %
Lymphs Abs: 0.7 10*3/uL (ref 0.7–4.0)
MCH: 26.7 pg (ref 26.0–34.0)
MCHC: 32.1 g/dL (ref 30.0–36.0)
MCV: 83.1 fL (ref 80.0–100.0)
Monocytes Absolute: 0.5 10*3/uL (ref 0.1–1.0)
Monocytes Relative: 8 %
Neutro Abs: 5.2 10*3/uL (ref 1.7–7.7)
Neutrophils Relative %: 79 %
Platelets: 270 10*3/uL (ref 150–400)
RBC: 4.09 MIL/uL — ABNORMAL LOW (ref 4.22–5.81)
RDW: 13.2 % (ref 11.5–15.5)
WBC: 6.5 10*3/uL (ref 4.0–10.5)
nRBC: 0 % (ref 0.0–0.2)

## 2023-04-30 LAB — URINALYSIS, W/ REFLEX TO CULTURE (INFECTION SUSPECTED)
Bacteria, UA: NONE SEEN
Bilirubin Urine: NEGATIVE
Glucose, UA: NEGATIVE mg/dL
Ketones, ur: NEGATIVE mg/dL
Leukocytes,Ua: NEGATIVE
Nitrite: NEGATIVE
Protein, ur: 100 mg/dL — AB
Specific Gravity, Urine: 1.024 (ref 1.005–1.030)
pH: 6 (ref 5.0–8.0)

## 2023-04-30 LAB — RESP PANEL BY RT-PCR (RSV, FLU A&B, COVID)  RVPGX2
Influenza A by PCR: NEGATIVE
Influenza B by PCR: NEGATIVE
Resp Syncytial Virus by PCR: NEGATIVE
SARS Coronavirus 2 by RT PCR: NEGATIVE

## 2023-04-30 LAB — PROTIME-INR
INR: 1.1 (ref 0.8–1.2)
Prothrombin Time: 14.3 s (ref 11.4–15.2)

## 2023-04-30 LAB — LACTIC ACID, PLASMA
Lactic Acid, Venous: 1.5 mmol/L (ref 0.5–1.9)
Lactic Acid, Venous: 1 mmol/L (ref 0.5–1.9)

## 2023-04-30 MED ORDER — SODIUM CHLORIDE 0.9 % IV SOLN
2.0000 g | INTRAVENOUS | Status: DC
  Administered 2023-05-01 – 2023-05-02 (×2): 2 g via INTRAVENOUS
  Filled 2023-04-30 (×2): qty 20

## 2023-04-30 MED ORDER — DICLOFENAC SODIUM 75 MG PO TBEC: 75.0000 mg | DELAYED_RELEASE_TABLET | Freq: Two times a day (BID) | ORAL | Status: DC | PRN

## 2023-04-30 MED ORDER — IPRATROPIUM-ALBUTEROL 0.5-2.5 (3) MG/3ML IN SOLN
3.0000 mL | Freq: Once | RESPIRATORY_TRACT | Status: AC
Start: 2023-04-30 — End: 2023-04-30
  Administered 2023-04-30: 3 mL via RESPIRATORY_TRACT
  Filled 2023-04-30: qty 3

## 2023-04-30 MED ORDER — VITAMIN D 25 MCG (1000 UNIT) PO TABS
2000.0000 [IU] | ORAL_TABLET | Freq: Every day | ORAL | Status: DC
  Administered 2023-05-01 – 2023-05-06 (×6): 2000 [IU] via ORAL
  Filled 2023-04-30 (×7): qty 2

## 2023-04-30 MED ORDER — SODIUM CHLORIDE 0.9 % IV SOLN
500.0000 mg | Freq: Once | INTRAVENOUS | Status: AC
  Administered 2023-04-30: 500 mg via INTRAVENOUS
  Filled 2023-04-30: qty 5

## 2023-04-30 MED ORDER — ASPIRIN 325 MG PO TABS
325.0000 mg | ORAL_TABLET | Freq: Every day | ORAL | Status: DC
  Administered 2023-04-30 – 2023-05-02 (×3): 325 mg via ORAL
  Filled 2023-04-30 (×3): qty 1

## 2023-04-30 MED ORDER — IBUPROFEN 200 MG PO TABS: 600.0000 mg | ORAL_TABLET | Freq: Four times a day (QID) | ORAL | Status: DC

## 2023-04-30 MED ORDER — ACETAMINOPHEN 325 MG PO TABS
650.0000 mg | ORAL_TABLET | Freq: Once | ORAL | Status: AC
  Administered 2023-04-30: 650 mg via ORAL
  Filled 2023-04-30: qty 2

## 2023-04-30 MED ORDER — TERAZOSIN HCL 5 MG PO CAPS
5.0000 mg | ORAL_CAPSULE | Freq: Every day | ORAL | Status: DC
  Administered 2023-04-30 – 2023-05-01 (×2): 5 mg via ORAL
  Filled 2023-04-30 (×2): qty 1

## 2023-04-30 MED ORDER — CHLORTHALIDONE 25 MG PO TABS
25.0000 mg | ORAL_TABLET | Freq: Every day | ORAL | Status: DC
  Administered 2023-05-01 – 2023-05-05 (×5): 25 mg via ORAL
  Filled 2023-04-30 (×5): qty 1

## 2023-04-30 MED ORDER — LACTATED RINGERS IV SOLN
INTRAVENOUS | Status: DC
  Administered 2023-04-30: 950 mL via INTRAVENOUS

## 2023-04-30 MED ORDER — FINASTERIDE 5 MG PO TABS
5.0000 mg | ORAL_TABLET | Freq: Every day | ORAL | Status: DC
  Administered 2023-05-01 – 2023-05-10 (×9): 5 mg via ORAL
  Filled 2023-04-30 (×9): qty 1

## 2023-04-30 MED ORDER — ENOXAPARIN SODIUM 40 MG/0.4ML IJ SOSY
40.0000 mg | PREFILLED_SYRINGE | INTRAMUSCULAR | Status: DC
  Administered 2023-04-30 – 2023-05-04 (×5): 40 mg via SUBCUTANEOUS
  Filled 2023-04-30 (×5): qty 0.4

## 2023-04-30 MED ORDER — SODIUM CHLORIDE 0.9 % IV SOLN
500.0000 mg | INTRAVENOUS | Status: DC
  Administered 2023-05-01 – 2023-05-03 (×3): 500 mg via INTRAVENOUS
  Filled 2023-04-30 (×3): qty 5

## 2023-04-30 MED ORDER — SODIUM CHLORIDE 0.9 % IV SOLN
2.0000 g | Freq: Once | INTRAVENOUS | Status: AC
  Administered 2023-04-30: 2 g via INTRAVENOUS
  Filled 2023-04-30: qty 20

## 2023-04-30 MED ORDER — HYDRALAZINE HCL 50 MG PO TABS
50.0000 mg | ORAL_TABLET | Freq: Three times a day (TID) | ORAL | Status: DC
  Administered 2023-04-30 – 2023-05-06 (×17): 50 mg via ORAL
  Filled 2023-04-30 (×18): qty 1

## 2023-04-30 MED ORDER — SODIUM CHLORIDE 0.9 % IV SOLN: INTRAVENOUS | Status: DC

## 2023-04-30 NOTE — ED Notes (Signed)
Pt has urinal at bedside 

## 2023-04-30 NOTE — ED Notes (Signed)
 Pt evaluated by respiratory and placed back in lobby with 2L via [place].

## 2023-04-30 NOTE — ED Notes (Signed)
 Called [facility] for transport, pt bed assignment is ready

## 2023-04-30 NOTE — H&P (Signed)
 History and Physical    Patient: Patrick Villa WUJ:[id number] DOB: 07/30/1933 DOA: 04/30/2023 DOS: the patient was seen and examined on 04/30/2023 PCP: Ollen Bowl, MD  Patient coming from: Home  Chief Complaint:  Chief Complaint  Patient presents with   Respiratory Distress   Fever   HPI: Patrick Villa is a 89 y.o. male with medical history significant of COPD, type 2 diabetes, hyperlipidemia, essential hypertension, BPH who presented to [facility] with complaint of generalized weakness and shortness of breath.  Symptoms have been going on for 2 weeks but have gotten worse in the last day.  Associated shortness of breath.  Patient was seen and evaluated.  Has cough but nonproductive.  Patient also did had oxygen saturation of 87%.  He was seen earlier 4 days ago where he was treated for multifocal pneumonia with oral Augmentin and doxycycline.  He was discharged home but patient continues to have symptoms so he came back today.  Today's oxygen saturation 87% on room air and he was initiated on oxygen.  He is also having fever temperature 101 respirate of 33 thereby meeting sepsis criteria.  Patient is therefore being admitted to the hospital for treatment of sepsis due to pneumonia.  Review of Systems: As mentioned in the history of present illness. All other systems reviewed and are negative. Past Medical History:  Diagnosis Date   Colon polyps    COPD (chronic obstructive pulmonary disease) (HCC)    Diabetes mellitus without complication (HCC) 05/01/2020   Echocardiogram abnormal    Ganglion cyst    Hematuria    Hyperlipidemia    Hypertension    Systolic murmur    Past Surgical History:  Procedure Laterality Date   COLON SURGERY     TRANSTHORACIC ECHOCARDIOGRAM     Social History:  reports that he quit smoking about 41 years ago. His smoking use included cigarettes. He has never used smokeless tobacco. He reports that he does not drink alcohol and does not  use drugs.  Allergies  Allergen Reactions   Amlodipine Other (See Comments)    Patient developed jitteriness, tremors and unstable gait.  Same issues when re-challenged   Lipitor [Atorvastatin Calcium]     Weakness.   Zocor [Simvastatin]    Bactrim [Sulfamethoxazole-Trimethoprim] Rash   Pravachol Hives and Rash    Family History  Problem Relation Age of Onset   Diabetes Mother    Stroke Brother     Prior to Admission medications   Medication Sig Start Date End Date Taking? Authorizing Provider  amoxicillin-clavulanate (AUGMENTIN) 875-125 MG tablet Take 1 tablet by mouth every 12 (twelve) hours. 04/27/23   Arabella Merles, PA-C  aspirin 325 MG tablet Take 325 mg by mouth daily.    [provider]  chlorthalidone (HYGROTON) 25 MG tablet Take 1 tablet (25 mg total) by mouth daily. 2nd attempt. Pt needs yearly appt for any future refills. Please call office to schedule appt @ [phone]. 12/30/22   Jodelle Red, MD  Cholecalciferol (VITAMIN D) 2000 UNITS CAPS Take 1 capsule (2,000 Units total) by mouth daily. 07/17/14   Dorena Bodo, PA-C  diclofenac (VOLTAREN) 75 MG EC tablet Take 1 tablet (75 mg total) by mouth 2 (two) times daily as needed. Take with food 11/05/21   Cristie Hem, PA-C  doxycycline (VIBRAMYCIN) 100 MG capsule Take 1 capsule (100 mg total) by mouth 2 (two) times daily. 04/27/23   Arabella Merles, PA-C  finasteride (PROSCAR) 5  MG tablet Take 1 tablet (5 mg total) by mouth daily. 02/25/21   Valentino Nose, NP  hydrALAZINE (APRESOLINE) 50 MG tablet TAKE 1 TABLET BY MOUTH 3 TIMES  DAILY 11/18/22   Jodelle Red, MD  terazosin (HYTRIN) 5 MG capsule Take 5 mg by mouth at bedtime.    [provider]    Physical Exam: Vitals:   04/30/23 1515 04/30/23 1530 04/30/23 1547 04/30/23 1813  BP: 121/65 123/61  (!) 145/76  Pulse: 76 78  79  Resp: (!) 25 (!) 22  14  Temp:   99.5 F (37.5 C) 98.8 F (37.1 C)  TempSrc:   Oral Oral   SpO2: 97% 99%  92%  Weight:    83.5 kg  Height:    5\' 6"  (1.676 m)   Constitutional: Acutely ill looking NAD, calm, comfortable Eyes: PERRL, lids and conjunctivae normal ENMT: Mucous membranes are moist. Posterior pharynx clear of any exudate or lesions.Normal dentition.  Neck: normal, supple, no masses, no thyromegaly Respiratory: Coarse breath sounds with decreased air entry,, no wheezing, no crackles. Normal respiratory effort. No accessory muscle use.  Cardiovascular: Regular rate and rhythm, no murmurs / rubs / gallops. No extremity edema. 2+ pedal pulses. No carotid bruits.  Abdomen: no tenderness, no masses palpated. No hepatosplenomegaly. Bowel sounds positive.  Musculoskeletal: Good range of motion, no joint swelling or tenderness, Skin: no rashes, lesions, ulcers. No induration Neurologic: CN 2-12 grossly intact. Sensation intact, DTR normal. Strength 5/5 in all 4.  Psychiatric: Normal judgment and insight. Alert and oriented x 3. Normal mood  Data Reviewed:  Temperature 101.1, respiratory of 33, blood pressure 142/76 and oxygen sats 89% on room air white count 6.5, hemoglobin 10.9, potassium 3.1, BUN 24 and creatinine 1.52 glucose 110 acute viral screen negative.  Urinalysis negative.  Chest x-ray showed persistent patchy airspace opacity with bilateral pleural effusions possible developing mild pulmonary edema  Assessment and Plan:     Advance Care Planning:   Code Status: Full Code   Consults: None  Family Communication: No family at bedside  Severity of Illness: The appropriate patient status for this patient is INPATIENT. Inpatient status is judged to be reasonable and necessary in order to provide the required intensity of service to ensure the patient's safety. The patient's presenting symptoms, physical exam findings, and initial radiographic and laboratory data in the context of their chronic comorbidities is felt to place them at high risk for further clinical  deterioration. Furthermore, it is not anticipated that the patient will be medically stable for discharge from the hospital within 2 midnights of admission.   * I certify that at the point of admission it is my clinical judgment that the patient will require inpatient hospital care spanning beyond 2 midnights from the point of admission due to high intensity of service, high risk for further deterioration and high frequency of surveillance required.*  AuthorLonia Blood, MD 04/30/2023 6:44 PM  For on call review [email or web address].

## 2023-04-30 NOTE — ED Triage Notes (Signed)
 Pt was seen on x4 days ago and diagnosed with pneumonia. Pt treated and d/c with abx. Pt family reports pt breathing has gotten worse and pt still having fevers, increased weakness.

## 2023-04-30 NOTE — Progress Notes (Signed)
 Paged admissions x2 for pt's arrival. Awaiting on call back

## 2023-04-30 NOTE — Hospital Course (Signed)
 89 year old male with past medical history of COPD, hyperlipidemia, hypertension, BPH who presented to [facility] emergency department with complaints of generalized weakness fever and shortness of breath.  Patient has been experiencing symptoms for the past 2 weeks, prompting his presentation to [facility] on 4/1.  At that time the patient was found to have bilateral multifocal pneumonia and was sent home on a course of oral Augmentin.  In the following days despite taking the Augmentin the patient experienced worsening symptoms prompting him to present once again to [facility] for repeat evaluation.  On repeat evaluation patient was found to be hypoxic saturating 87% on room air prompting initiation of nasal cannula.  Patient was also found afebrile on arrival with temperature of 101.1 F.  Chest x-ray revealed persisting infiltrates.  Patient was initiated on intravenous ceftriaxone and azithromycin.  ED provider requesting hospitalist acceptance for continued medical care.

## 2023-04-30 NOTE — ED Provider Notes (Signed)
 [place] EMERGENCY DEPARTMENT AT [facility] Provider Note   CSN: [id number] Arrival date & time: 04/30/23  1227     History  Chief Complaint  Patient presents with   Respiratory Distress   Fever    Patrick Villa is a 89 y.o. male.  89 year old male with past medical history of COPD, hypertension, hyperlipidemia, and diabetes presenting to the emergency department today with cough and shortness of breath.  The patient was recently seen by his primary care doctor and has been started on Augmentin.  He has been taking this for the past 4 days.  The patient reports that his symptoms have worsened.  He has had generalized weakness as well as dyspnea with exertion.  His pulse ox on arrival is in the high 80s.  He came to the ER due to worsening symptoms.   Fever Associated symptoms: cough        Home Medications Prior to Admission medications   Medication Sig Start Date End Date Taking? Authorizing Provider  amoxicillin-clavulanate (AUGMENTIN) 875-125 MG tablet Take 1 tablet by mouth every 12 (twelve) hours. 04/27/23   Arabella Merles, PA-C  aspirin 325 MG tablet Take 325 mg by mouth daily.    [provider]  chlorthalidone (HYGROTON) 25 MG tablet Take 1 tablet (25 mg total) by mouth daily. 2nd attempt. Pt needs yearly appt for any future refills. Please call office to schedule appt @ [phone]. 12/30/22   Jodelle Red, MD  Cholecalciferol (VITAMIN D) 2000 UNITS CAPS Take 1 capsule (2,000 Units total) by mouth daily. 07/17/14   Dorena Bodo, PA-C  diclofenac (VOLTAREN) 75 MG EC tablet Take 1 tablet (75 mg total) by mouth 2 (two) times daily as needed. Take with food 11/05/21   Cristie Hem, PA-C  doxycycline (VIBRAMYCIN) 100 MG capsule Take 1 capsule (100 mg total) by mouth 2 (two) times daily. 04/27/23   Arabella Merles, PA-C  finasteride (PROSCAR) 5 MG tablet Take 1 tablet (5 mg total) by mouth daily. 02/25/21   Valentino Nose, NP  hydrALAZINE  (APRESOLINE) 50 MG tablet TAKE 1 TABLET BY MOUTH 3 TIMES  DAILY 11/18/22   Jodelle Red, MD  terazosin (HYTRIN) 5 MG capsule Take 5 mg by mouth at bedtime.    [provider]      Allergies    Amlodipine, Lipitor [atorvastatin calcium], Zocor [simvastatin], Bactrim [sulfamethoxazole-trimethoprim], and Pravachol    Review of Systems   Review of Systems  Constitutional:  Positive for fever.  Respiratory:  Positive for cough and shortness of breath.   All other systems reviewed and are negative.   Physical Exam Updated Vital Signs BP (!) 118/59   Pulse 73   Temp (!) 101.1 F (38.4 C)   Resp (!) 33   Ht 5\' 6"  (1.676 m)   Wt 83.5 kg   SpO2 92%   BMI 29.71 kg/m  Physical Exam Vitals and nursing note reviewed.   Gen: Mild conversational dyspnea noted Eyes: PERRL, EOMI HEENT: no oropharyngeal swelling Neck: trachea midline Resp: Coarse breath sounds bilateral lung bases, no wheezes Card: RRR, no murmurs, rubs, or gallops Abd: nontender, nondistended Extremities: no calf tenderness, no edema Vascular: 2+ radial pulses bilaterally, 2+ DP pulses bilaterally Skin: no rashes Psyc: acting appropriately   ED Results / Procedures / Treatments   Labs (all labs ordered are listed, but only abnormal results are displayed) Labs Reviewed  COMPREHENSIVE METABOLIC PANEL WITH GFR - Abnormal; Notable for the following components:  Result Value   Potassium 3.1 (*)    Glucose, Bld 110 (*)    BUN 24 (*)    Creatinine, Ser 1.52 (*)    Albumin 3.4 (*)    AST 50 (*)    GFR, Estimated 44 (*)    All other components within normal limits  CBC WITH DIFFERENTIAL/PLATELET - Abnormal; Notable for the following components:   RBC 4.09 (*)    Hemoglobin 10.9 (*)    HCT 34.0 (*)    All other components within normal limits  RESP PANEL BY RT-PCR (RSV, FLU A&B, COVID)  RVPGX2  CULTURE, BLOOD (ROUTINE X 2)  CULTURE, BLOOD (ROUTINE X 2)  LACTIC ACID, PLASMA  PROTIME-INR   LACTIC ACID, PLASMA  URINALYSIS, W/ REFLEX TO CULTURE (INFECTION SUSPECTED)    EKG None  Radiology DG Chest Port 1 View Result Date: 04/30/2023 : Questionable sepsis - evaluate for abnormality EXAM: PORTABLE CHEST 1 VIEW COMPARISON:  Chest x-ray 04/26/2023, CT chest 04/26/2023 FINDINGS: The heart and mediastinal contours are unchanged. Persistent patchy airspace opacities. Slightly increased perihilar interstitial markings. At least trace bilateral pleural effusions pleural effusion. No pneumothorax. No acute osseous abnormality. IMPRESSION: 1. Persistent patchy airspace opacities with at least trace bilateral pleural effusions. 2. Possible developing mild pulmonary edema. 3. Followup PA and lateral chest X-ray is recommended in 3-4 weeks following therapy to ensure resolution. Electronically Signed   By: Tish Frederickson M.D.   On: 04/30/2023 14:02    Procedures Procedures    Medications Ordered in ED Medications  lactated ringers infusion (950 mLs Intravenous New Bag/Given 04/30/23 1424)  azithromycin (ZITHROMAX) 500 mg in sodium chloride 0.9 % 250 mL IVPB (500 mg Intravenous New Bag/Given 04/30/23 1429)  cefTRIAXone (ROCEPHIN) 2 g in sodium chloride 0.9 % 100 mL IVPB (0 g Intravenous Stopped 04/30/23 1455)  acetaminophen (TYLENOL) tablet 650 mg (650 mg Oral Given 04/30/23 1429)  ipratropium-albuterol (DUONEB) 0.5-2.5 (3) MG/3ML nebulizer solution 3 mL (3 mLs Nebulization Given 04/30/23 1357)    ED Course/ Medical Decision Making/ A&P                                 Medical Decision Making 89 year old male with past medical history of COPD, hypertension, hyperlipidemia, and diabetes presenting to the emergency department today with fevers, shortness of breath, and cough despite being on oral antibiotics as an outpatient.  I will initiate a sepsis workup on the patient.  Will give him Rocephin and azithromycin here.  Will also give him a breathing treatment although he does not have a lot of  wheezing here on exam.  The patient is placed on nasal cannula oxygen with improvement.  He will require admission.  The patient's chest x-ray interpreted by me does show persistent pneumonia.  The patient's initial lactic acid is 1.5.  A call was placed to hospital service for admission.  Amount and/or Complexity of Data Reviewed Labs: ordered. Radiology: ordered.  Risk OTC drugs. Prescription drug management. Decision regarding hospitalization.           Final Clinical Impression(s) / ED Diagnoses Final diagnoses:  Pneumonia due to infectious organism, unspecified laterality, unspecified part of lung  Hypoxia    Rx / DC Orders ED Discharge Orders     None         Durwin Glaze, MD 04/30/23 1510

## 2023-04-30 NOTE — Sepsis Progress Note (Signed)
 Sepsis protocol is being followed by [facility].

## 2023-04-30 NOTE — Progress Notes (Addendum)
 Plan of Care Note for accepted transfer  Patient: Patrick Villa    ZOX:[id number]  DOA: 04/30/2023     Facility requesting transfer: [facility] Requesting Provider: Dr. Rhae Hammock Reason for transfer: Acute respiratory failure, multifocal pneumonia. Facility course:   89 year old male with past medical history of COPD, hyperlipidemia, hypertension, BPH who presented to [facility] emergency department with complaints of generalized weakness fever and shortness of breath.  Patient has been experiencing symptoms for the past 2 weeks, prompting his presentation to [facility] on 4/1.  At that time the patient was found to have bilateral multifocal pneumonia and was sent home on a course of oral Augmentin.  In the following days despite taking the Augmentin the patient experienced worsening symptoms prompting him to present once again to [facility] for repeat evaluation.  On repeat evaluation patient was found to be hypoxic saturating 87% on room air prompting initiation of nasal cannula.  Patient was also found afebrile on arrival with temperature of 101.1 F.  Chest x-ray revealed persisting infiltrates.  Patient was initiated on intravenous ceftriaxone and azithromycin.  ED provider requesting hospitalist acceptance for continued medical care.  Upon my review of the patient's chart it seems the patient is exhibiting multiple SIRS criteria concerning for sepsis secondary to multifocal pneumonia.  Will accept patient to progressive bed.  Plan of care: The patient is accepted for admission to Progressive unit, at [facility] upon first available bed.  Author: Marinda Elk, MD  04/30/2023  Check [email or web address] for on-call coverage.  Nursing staff, Please call [facility] Admits & Consults System-Wide number on Amion as soon as patient's arrival, so appropriate admitting provider can evaluate the pt.

## 2023-05-01 ENCOUNTER — Observation Stay (HOSPITAL_COMMUNITY)

## 2023-05-01 DIAGNOSIS — F05 Delirium due to known physiological condition: Secondary | ICD-10-CM | POA: Diagnosis not present

## 2023-05-01 DIAGNOSIS — A419 Sepsis, unspecified organism: Secondary | ICD-10-CM | POA: Diagnosis not present

## 2023-05-01 DIAGNOSIS — I469 Cardiac arrest, cause unspecified: Secondary | ICD-10-CM | POA: Diagnosis not present

## 2023-05-01 DIAGNOSIS — R918 Other nonspecific abnormal finding of lung field: Secondary | ICD-10-CM | POA: Diagnosis not present

## 2023-05-01 DIAGNOSIS — I493 Ventricular premature depolarization: Secondary | ICD-10-CM | POA: Diagnosis not present

## 2023-05-01 DIAGNOSIS — G9340 Encephalopathy, unspecified: Secondary | ICD-10-CM | POA: Diagnosis not present

## 2023-05-01 DIAGNOSIS — I77819 Aortic ectasia, unspecified site: Secondary | ICD-10-CM | POA: Diagnosis not present

## 2023-05-01 DIAGNOSIS — E870 Hyperosmolality and hypernatremia: Secondary | ICD-10-CM | POA: Diagnosis not present

## 2023-05-01 DIAGNOSIS — I517 Cardiomegaly: Secondary | ICD-10-CM | POA: Diagnosis not present

## 2023-05-01 DIAGNOSIS — J440 Chronic obstructive pulmonary disease with acute lower respiratory infection: Secondary | ICD-10-CM | POA: Diagnosis not present

## 2023-05-01 DIAGNOSIS — J189 Pneumonia, unspecified organism: Secondary | ICD-10-CM | POA: Diagnosis present

## 2023-05-01 DIAGNOSIS — E440 Moderate protein-calorie malnutrition: Secondary | ICD-10-CM | POA: Diagnosis not present

## 2023-05-01 DIAGNOSIS — E872 Acidosis, unspecified: Secondary | ICD-10-CM | POA: Diagnosis not present

## 2023-05-01 DIAGNOSIS — I5021 Acute systolic (congestive) heart failure: Secondary | ICD-10-CM | POA: Diagnosis not present

## 2023-05-01 DIAGNOSIS — N179 Acute kidney failure, unspecified: Secondary | ICD-10-CM | POA: Diagnosis not present

## 2023-05-01 DIAGNOSIS — E162 Hypoglycemia, unspecified: Secondary | ICD-10-CM | POA: Diagnosis not present

## 2023-05-01 DIAGNOSIS — I2510 Atherosclerotic heart disease of native coronary artery without angina pectoris: Secondary | ICD-10-CM | POA: Diagnosis not present

## 2023-05-01 DIAGNOSIS — R579 Shock, unspecified: Secondary | ICD-10-CM | POA: Diagnosis not present

## 2023-05-01 DIAGNOSIS — I509 Heart failure, unspecified: Secondary | ICD-10-CM | POA: Diagnosis not present

## 2023-05-01 DIAGNOSIS — N401 Enlarged prostate with lower urinary tract symptoms: Secondary | ICD-10-CM | POA: Diagnosis not present

## 2023-05-01 DIAGNOSIS — J984 Other disorders of lung: Secondary | ICD-10-CM | POA: Diagnosis not present

## 2023-05-01 DIAGNOSIS — Z1152 Encounter for screening for COVID-19: Secondary | ICD-10-CM | POA: Diagnosis not present

## 2023-05-01 DIAGNOSIS — R0902 Hypoxemia: Secondary | ICD-10-CM | POA: Diagnosis not present

## 2023-05-01 DIAGNOSIS — I110 Hypertensive heart disease with heart failure: Secondary | ICD-10-CM | POA: Diagnosis not present

## 2023-05-01 DIAGNOSIS — I700 Atherosclerosis of aorta: Secondary | ICD-10-CM | POA: Diagnosis not present

## 2023-05-01 DIAGNOSIS — T380X5A Adverse effect of glucocorticoids and synthetic analogues, initial encounter: Secondary | ICD-10-CM | POA: Diagnosis not present

## 2023-05-01 DIAGNOSIS — Z8679 Personal history of other diseases of the circulatory system: Secondary | ICD-10-CM | POA: Diagnosis not present

## 2023-05-01 DIAGNOSIS — I429 Cardiomyopathy, unspecified: Secondary | ICD-10-CM | POA: Diagnosis not present

## 2023-05-01 DIAGNOSIS — E785 Hyperlipidemia, unspecified: Secondary | ICD-10-CM | POA: Diagnosis present

## 2023-05-01 DIAGNOSIS — I10 Essential (primary) hypertension: Secondary | ICD-10-CM | POA: Diagnosis not present

## 2023-05-01 DIAGNOSIS — I5020 Unspecified systolic (congestive) heart failure: Secondary | ICD-10-CM | POA: Diagnosis not present

## 2023-05-01 DIAGNOSIS — J90 Pleural effusion, not elsewhere classified: Secondary | ICD-10-CM | POA: Diagnosis not present

## 2023-05-01 DIAGNOSIS — R739 Hyperglycemia, unspecified: Secondary | ICD-10-CM | POA: Diagnosis not present

## 2023-05-01 DIAGNOSIS — E1165 Type 2 diabetes mellitus with hyperglycemia: Secondary | ICD-10-CM | POA: Diagnosis present

## 2023-05-01 DIAGNOSIS — E876 Hypokalemia: Secondary | ICD-10-CM | POA: Diagnosis not present

## 2023-05-01 DIAGNOSIS — R846 Abnormal cytological findings in specimens from respiratory organs and thorax: Secondary | ICD-10-CM | POA: Diagnosis not present

## 2023-05-01 DIAGNOSIS — E11649 Type 2 diabetes mellitus with hypoglycemia without coma: Secondary | ICD-10-CM | POA: Diagnosis not present

## 2023-05-01 DIAGNOSIS — R590 Localized enlarged lymph nodes: Secondary | ICD-10-CM | POA: Diagnosis not present

## 2023-05-01 DIAGNOSIS — I441 Atrioventricular block, second degree: Secondary | ICD-10-CM | POA: Diagnosis not present

## 2023-05-01 DIAGNOSIS — D649 Anemia, unspecified: Secondary | ICD-10-CM | POA: Diagnosis present

## 2023-05-01 DIAGNOSIS — R531 Weakness: Secondary | ICD-10-CM | POA: Diagnosis not present

## 2023-05-01 DIAGNOSIS — Z4682 Encounter for fitting and adjustment of non-vascular catheter: Secondary | ICD-10-CM | POA: Diagnosis not present

## 2023-05-01 DIAGNOSIS — J9601 Acute respiratory failure with hypoxia: Secondary | ICD-10-CM | POA: Diagnosis not present

## 2023-05-01 DIAGNOSIS — R9389 Abnormal findings on diagnostic imaging of other specified body structures: Secondary | ICD-10-CM | POA: Diagnosis not present

## 2023-05-01 DIAGNOSIS — E875 Hyperkalemia: Secondary | ICD-10-CM | POA: Diagnosis not present

## 2023-05-01 DIAGNOSIS — K224 Dyskinesia of esophagus: Secondary | ICD-10-CM | POA: Diagnosis not present

## 2023-05-01 DIAGNOSIS — R0602 Shortness of breath: Secondary | ICD-10-CM | POA: Diagnosis not present

## 2023-05-01 LAB — COMPREHENSIVE METABOLIC PANEL WITH GFR
ALT: 35 U/L (ref 0–44)
AST: 61 U/L — ABNORMAL HIGH (ref 15–41)
Albumin: 2.4 g/dL — ABNORMAL LOW (ref 3.5–5.0)
Alkaline Phosphatase: 49 U/L (ref 38–126)
Anion gap: 11 (ref 5–15)
BUN: 21 mg/dL (ref 8–23)
CO2: 24 mmol/L (ref 22–32)
Calcium: 9 mg/dL (ref 8.9–10.3)
Chloride: 105 mmol/L (ref 98–111)
Creatinine, Ser: 1.18 mg/dL (ref 0.61–1.24)
GFR, Estimated: 60 mL/min — ABNORMAL LOW (ref >60)
Glucose, Bld: 96 mg/dL (ref 70–99)
Potassium: 3.1 mmol/L — ABNORMAL LOW (ref 3.5–5.1)
Sodium: 140 mmol/L (ref 135–145)
Total Bilirubin: 0.5 mg/dL (ref 0.0–1.2)
Total Protein: 6.2 g/dL — ABNORMAL LOW (ref 6.5–8.1)

## 2023-05-01 LAB — ECHOCARDIOGRAM COMPLETE
AR max vel: 1.82 cm2
AV Area VTI: 1.78 cm2
AV Area mean vel: 1.74 cm2
AV Mean grad: 7 mmHg
AV Peak grad: 12.7 mmHg
Ao pk vel: 1.78 m/s
Area-P 1/2: 6.02 cm2
Calc EF: 48.5 %
Height: 66 in
S' Lateral: 3.6 cm
Single Plane A2C EF: 43.2 %
Single Plane A4C EF: 50 %
Weight: 2945.35 [oz_av]

## 2023-05-01 LAB — CBC WITH DIFFERENTIAL/PLATELET
Abs Immature Granulocytes: 0.04 10*3/uL (ref 0.00–0.07)
Basophils Absolute: 0 10*3/uL (ref 0.0–0.1)
Basophils Relative: 0 %
Eosinophils Absolute: 0.1 10*3/uL (ref 0.0–0.5)
Eosinophils Relative: 1 %
HCT: 34.1 % — ABNORMAL LOW (ref 39.0–52.0)
Hemoglobin: 10.5 g/dL — ABNORMAL LOW (ref 13.0–17.0)
Immature Granulocytes: 1 %
Lymphocytes Relative: 18 %
Lymphs Abs: 1.2 10*3/uL (ref 0.7–4.0)
MCH: 26.9 pg (ref 26.0–34.0)
MCHC: 30.8 g/dL (ref 30.0–36.0)
MCV: 87.4 fL (ref 80.0–100.0)
Monocytes Absolute: 0.4 10*3/uL (ref 0.1–1.0)
Monocytes Relative: 7 %
Neutro Abs: 4.6 10*3/uL (ref 1.7–7.7)
Neutrophils Relative %: 73 %
Platelets: 232 10*3/uL (ref 150–400)
RBC: 3.9 MIL/uL — ABNORMAL LOW (ref 4.22–5.81)
RDW: 13.4 % (ref 11.5–15.5)
WBC: 6.3 10*3/uL (ref 4.0–10.5)
nRBC: 0 % (ref 0.0–0.2)

## 2023-05-01 LAB — HIV ANTIBODY (ROUTINE TESTING W REFLEX): HIV Screen 4th Generation wRfx: NONREACTIVE

## 2023-05-01 LAB — MAGNESIUM: Magnesium: 1.7 mg/dL (ref 1.7–2.4)

## 2023-05-01 MED ORDER — ORAL CARE MOUTH RINSE: 15.0000 mL | OROMUCOSAL | Status: DC | PRN

## 2023-05-01 MED ORDER — ACETAMINOPHEN 325 MG PO TABS
650.0000 mg | ORAL_TABLET | Freq: Four times a day (QID) | ORAL | Status: DC | PRN
  Administered 2023-05-01 – 2023-05-02 (×2): 650 mg via ORAL
  Filled 2023-05-01 (×2): qty 2

## 2023-05-01 MED ORDER — PERFLUTREN LIPID MICROSPHERE
1.0000 mL | INTRAVENOUS | Status: AC | PRN
  Administered 2023-05-01: 2 mL via INTRAVENOUS

## 2023-05-01 MED ORDER — FUROSEMIDE 10 MG/ML IJ SOLN
40.0000 mg | Freq: Two times a day (BID) | INTRAMUSCULAR | Status: AC
  Administered 2023-05-01 (×2): 40 mg via INTRAVENOUS
  Filled 2023-05-01 (×2): qty 4

## 2023-05-01 MED ORDER — POTASSIUM CHLORIDE CRYS ER 20 MEQ PO TBCR
40.0000 meq | EXTENDED_RELEASE_TABLET | Freq: Once | ORAL | Status: AC
  Administered 2023-05-01: 40 meq via ORAL
  Filled 2023-05-01: qty 2

## 2023-05-01 MED ORDER — LABETALOL HCL 5 MG/ML IV SOLN
10.0000 mg | INTRAVENOUS | Status: DC | PRN
  Administered 2023-05-01 (×2): 10 mg via INTRAVENOUS
  Filled 2023-05-01 (×2): qty 4

## 2023-05-01 NOTE — Progress Notes (Signed)
 PROGRESS NOTE  Patrick Villa  UEA:[id number] DOB: 13-Jun-1933 DOA: 04/30/2023 PCP: Ollen Bowl, MD   Brief Narrative: Patient is a 89 year old male with history of COPD, diabetes mellitus, hyperlipidemia, hypertension, BPH who presented to the device department with complaint of generalized weakness, shortness of breath, nonproductive cough, fever.  On presentation,he was hypoxic at 87%.  She was recently treated for multifocal pneumonia with Augmentin and doxycycline as an outpatient but continued to have symptoms.  On presentation, he was febrile up to one 101 .16F, saturating 89% on room air, potassium 3.1, creatinine 1.5.  Chest x-ray showed patchy airspace opacity with bilateral pleural effusion, possible mild pulmonary edema.  Patient admitted for further management of multifocal pneumonia not responding to outpatient antibiotic therapy.  Also suspected to have volume overload, started on IV Lasix, echo pending  Assessment & Plan:  Principal Problem:   Pneumonia of both lungs due to infectious organism Active Problems:   Essential hypertension   COPD (chronic obstructive pulmonary disease) (HCC)   Hyperlipidemia   BPH (benign prostatic hyperplasia)   Hypokalemia   AKI (acute kidney injury) (HCC)   Multifocal pneumonia/community-acquired pneumonia:He was recently treated for multifocal pneumonia with Augmentin and doxycycline as an outpatient but continued to have symptoms.  Chest x-ray showed patchy airspace opacity with suspicion of bilateral pleural effusion, possible mild pulmonary edema.  Started on broad-spectrum antibiotics.  Lactic acid was normal on presentation.  Follow-up cultures  Suspected congestive heart failure: Does not have peripheral edema but has bilateral crackles.  Chest imaging suggestive of features of pulmonary edema.  Elevated BNP as per 4/1.  Will give 2 doses of Lasix IV 40 mg.  Checking echocardiogram  Acute hypoxic respiratory/COPD: Not on on oxygen at  home.  Currently on 2 L of oxygen.  Continue to wean and monitor  Hypokalemia: Currently being supplemented and monitored.  Will check magnesium level  AKI: Resolved   Hypertension: Currently hypertensive.  Continue to  monitor blood pressure.  Continue home medications: Chlorthalidone, hydralazine.Continue PRN medication for severe hypertension  YNW:GNFAOZHY terazosin,proscar  Debility/deconditioning: Patient lives alone.  Ambulates well.  He daughter lives just a few minutes away.  Will consult PT/OT when appropriate.          DVT prophylaxis:enoxaparin (LOVENOX) injection 40 mg Start: 04/30/23 2200     Code Status: Full Code  Family Communication: Called and discussed with daughter Eber Jones on phone on 4/6  Patient status:Inpatient  Patient is from :Home  Anticipated discharge QM:VHQI  Estimated DC date: 2 to 3 days   Consultants: None  Procedures:None  Antimicrobials:  Anti-infectives (From admission, onward)    Start     Dose/Rate Route Frequency Ordered Stop   05/01/23 1000  cefTRIAXone (ROCEPHIN) 2 g in sodium chloride 0.9 % 100 mL IVPB        2 g 200 mL/hr over 30 Minutes Intravenous Every 24 hours 04/30/23 1844 05/06/23 0959   05/01/23 1000  azithromycin (ZITHROMAX) 500 mg in sodium chloride 0.9 % 250 mL IVPB        500 mg 250 mL/hr over 60 Minutes Intravenous Every 24 hours 04/30/23 1844 05/06/23 0959   04/30/23 1345  cefTRIAXone (ROCEPHIN) 2 g in sodium chloride 0.9 % 100 mL IVPB        2 g 200 mL/hr over 30 Minutes Intravenous Once 04/30/23 1332 04/30/23 1455   04/30/23 1345  azithromycin (ZITHROMAX) 500 mg in sodium chloride 0.9 % 250 mL IVPB  500 mg 250 mL/hr over 60 Minutes Intravenous  Once 04/30/23 1332 04/30/23 1534       Subjective: Patient seen and examined at bedside today.  Hemodynamically stable.    He says he feels little better today.  Still on 2 L of oxygen.  Desaturated on room air.  Speaks in full sentences but appears little   tired.  No edema of the legs.  Objective: Vitals:   05/01/23 0503 05/01/23 0506 05/01/23 0642 05/01/23 0651  BP: (!) 161/78 (!) 152/73 (!) 172/75 (!) 174/79  Pulse: 78 79 84 86  Resp:   18   Temp:   99.8 F (37.7 C)   TempSrc:   Oral   SpO2:      Weight:      Height:        Intake/Output Summary (Last 24 hours) at 05/01/2023 0759 Last data filed at 05/01/2023 0300 Gross per 24 hour  Intake 1727.93 ml  Output 250 ml  Net 1477.93 ml   Filed Weights   04/30/23 1234 04/30/23 1813  Weight: 83.5 kg 83.5 kg    Examination:  General exam: Overall comfortable, not in distress, pleasant elderly male HEENT: PERRL Respiratory system: Bibasilar crackles Cardiovascular system: S1 & S2 heard, RRR.  Gastrointestinal system: Abdomen is nondistended, soft and nontender. Central nervous system: Alert and oriented Extremities: No edema, no clubbing ,no cyanosis Skin: No rashes, no ulcers,no icterus     Data Reviewed: I have personally reviewed following labs and imaging studies  CBC: Recent Labs  Lab 04/26/23 1323 04/30/23 1350 05/01/23 0515  WBC 7.0 6.5 6.3  NEUTROABS 5.4 5.2 4.6  HGB 11.9* 10.9* 10.5*  HCT 36.8* 34.0* 34.1*  MCV 84.0 83.1 87.4  PLT 246 270 232   Basic Metabolic Panel: Recent Labs  Lab 04/26/23 1429 04/30/23 1350 05/01/23 0515  NA 137 138 140  K 3.3* 3.1* 3.1*  CL 100 101 105  CO2 28 26 24   GLUCOSE 97 110* 96  BUN 22 24* 21  CREATININE 1.24 1.52* 1.18  CALCIUM 9.5 9.9 9.0     Recent Results (from the past 240 hours)  Resp panel by RT-PCR (RSV, Flu A&B, Covid) Anterior Nasal Swab     Status: None   Collection Time: 04/26/23  1:23 PM   Specimen: Anterior Nasal Swab  Result Value Ref Range Status   SARS Coronavirus 2 by RT PCR NEGATIVE NEGATIVE Final    Comment: (NOTE) SARS-CoV-2 target nucleic acids are NOT DETECTED.  The SARS-CoV-2 RNA is generally detectable in upper respiratory specimens during the acute phase of infection. The  lowest concentration of SARS-CoV-2 viral copies this assay can detect is 138 copies/mL. A negative result does not preclude SARS-Cov-2 infection and should not be used as the sole basis for treatment or other patient management decisions. A negative result may occur with  improper specimen collection/handling, submission of specimen other than nasopharyngeal swab, presence of viral mutation(s) within the areas targeted by this assay, and inadequate number of viral copies(<138 copies/mL). A negative result must be combined with clinical observations, patient history, and epidemiological information. The expected result is Negative.  Fact Sheet for Patients:  BloggerCourse.com  Fact Sheet for Healthcare Providers:  SeriousBroker.it  This test is no t yet approved or cleared by the [place] FDA and  has been authorized for detection and/or diagnosis of SARS-CoV-2 by FDA under an Emergency Use Authorization (EUA). This EUA will remain  in effect (meaning this test can be used) for  the duration of the COVID-19 declaration under Section 564(b)(1) of the Act, 21 U.S.C.section 360bbb-3(b)(1), unless the authorization is terminated  or revoked sooner.       Influenza A by PCR NEGATIVE NEGATIVE Final   Influenza B by PCR NEGATIVE NEGATIVE Final    Comment: (NOTE) The Xpert Xpress SARS-CoV-2/FLU/RSV plus assay is intended as an aid in the diagnosis of influenza from Nasopharyngeal swab specimens and should not be used as a sole basis for treatment. Nasal washings and aspirates are unacceptable for Xpert Xpress SARS-CoV-2/FLU/RSV testing.  Fact Sheet for Patients: BloggerCourse.com  Fact Sheet for Healthcare Providers: SeriousBroker.it  This test is not yet approved or cleared by the [place] FDA and has been authorized for detection and/or diagnosis of SARS-CoV-2 by FDA under  an Emergency Use Authorization (EUA). This EUA will remain in effect (meaning this test can be used) for the duration of the COVID-19 declaration under Section 564(b)(1) of the Act, 21 U.S.C. section 360bbb-3(b)(1), unless the authorization is terminated or revoked.     Resp Syncytial Virus by PCR NEGATIVE NEGATIVE Final    Comment: (NOTE) Fact Sheet for Patients: BloggerCourse.com  Fact Sheet for Healthcare Providers: SeriousBroker.it  This test is not yet approved or cleared by the [place] FDA and has been authorized for detection and/or diagnosis of SARS-CoV-2 by FDA under an Emergency Use Authorization (EUA). This EUA will remain in effect (meaning this test can be used) for the duration of the COVID-19 declaration under Section 564(b)(1) of the Act, 21 U.S.C. section 360bbb-3(b)(1), unless the authorization is terminated or revoked.  Performed at [facility], [address] [place], [place] [address]   Blood Culture (routine x 2)     Status: None (Preliminary result)   Collection Time: 04/30/23  1:40 PM   Specimen: BLOOD  Result Value Ref Range Status   Specimen Description   Final    BLOOD RIGHT ANTECUBITAL Performed at [facility], [address], [place], [place] [address]    Special Requests   Final    BOTTLES DRAWN AEROBIC AND ANAEROBIC Blood Culture results may not be optimal due to an excessive volume of blood received in culture bottles Performed at [facility], [address], [place], [place] [address]    Culture   Final    NO GROWTH < 24 HOURS Performed at [facility], [address] [place], [place] [address]    Report Status PENDING  Incomplete  Resp panel by RT-PCR (RSV, Flu A&B, Covid) Anterior Nasal Swab     Status: None   Collection Time: 04/30/23  1:50 PM   Specimen: Anterior Nasal Swab  Result Value Ref Range Status   SARS Coronavirus  2 by RT PCR NEGATIVE NEGATIVE Final    Comment: (NOTE) SARS-CoV-2 target nucleic acids are NOT DETECTED.  The SARS-CoV-2 RNA is generally detectable in upper respiratory specimens during the acute phase of infection. The lowest concentration of SARS-CoV-2 viral copies this assay can detect is 138 copies/mL. A negative result does not preclude SARS-Cov-2 infection and should not be used as the sole basis for treatment or other patient management decisions. A negative result may occur with  improper specimen collection/handling, submission of specimen other than nasopharyngeal swab, presence of viral mutation(s) within the areas targeted by this assay, and inadequate number of viral copies(<138 copies/mL). A negative result must be combined with clinical observations, patient history, and epidemiological information. The expected result is Negative.  Fact Sheet  for Patients:  BloggerCourse.com  Fact Sheet for Healthcare Providers:  SeriousBroker.it  This test is no t yet approved or cleared by the [place] FDA and  has been authorized for detection and/or diagnosis of SARS-CoV-2 by FDA under an Emergency Use Authorization (EUA). This EUA will remain  in effect (meaning this test can be used) for the duration of the COVID-19 declaration under Section 564(b)(1) of the Act, 21 U.S.C.section 360bbb-3(b)(1), unless the authorization is terminated  or revoked sooner.       Influenza A by PCR NEGATIVE NEGATIVE Final   Influenza B by PCR NEGATIVE NEGATIVE Final    Comment: (NOTE) The Xpert Xpress SARS-CoV-2/FLU/RSV plus assay is intended as an aid in the diagnosis of influenza from Nasopharyngeal swab specimens and should not be used as a sole basis for treatment. Nasal washings and aspirates are unacceptable for Xpert Xpress SARS-CoV-2/FLU/RSV testing.  Fact Sheet for Patients: BloggerCourse.com  Fact  Sheet for Healthcare Providers: SeriousBroker.it  This test is not yet approved or cleared by the [place] FDA and has been authorized for detection and/or diagnosis of SARS-CoV-2 by FDA under an Emergency Use Authorization (EUA). This EUA will remain in effect (meaning this test can be used) for the duration of the COVID-19 declaration under Section 564(b)(1) of the Act, 21 U.S.C. section 360bbb-3(b)(1), unless the authorization is terminated or revoked.     Resp Syncytial Virus by PCR NEGATIVE NEGATIVE Final    Comment: (NOTE) Fact Sheet for Patients: BloggerCourse.com  Fact Sheet for Healthcare Providers: SeriousBroker.it  This test is not yet approved or cleared by the [place] FDA and has been authorized for detection and/or diagnosis of SARS-CoV-2 by FDA under an Emergency Use Authorization (EUA). This EUA will remain in effect (meaning this test can be used) for the duration of the COVID-19 declaration under Section 564(b)(1) of the Act, 21 U.S.C. section 360bbb-3(b)(1), unless the authorization is terminated or revoked.  Performed at [facility], [address] [place], [place] [address]   Blood Culture (routine x 2)     Status: None (Preliminary result)   Collection Time: 04/30/23  2:05 PM   Specimen: BLOOD LEFT FOREARM  Result Value Ref Range Status   Specimen Description   Final    BLOOD LEFT FOREARM Performed at [facility], [address] [place], [place] [address]    Special Requests   Final    BOTTLES DRAWN AEROBIC AND ANAEROBIC Blood Culture adequate volume Performed at [facility], [address] [place], [place] [address]    Culture   Final    NO GROWTH < 24 HOURS Performed at [facility], [address], [place], [place] [address]    Report Status PENDING  Incomplete     Radiology Studies: DG  Chest Port 1 View Result Date: 04/30/2023 : Questionable sepsis - evaluate for abnormality EXAM: PORTABLE CHEST 1 VIEW COMPARISON:  Chest x-ray 04/26/2023, CT chest 04/26/2023 FINDINGS: The heart and mediastinal contours are unchanged. Persistent patchy airspace opacities. Slightly increased perihilar interstitial markings. At least trace bilateral pleural effusions pleural effusion. No pneumothorax. No acute osseous abnormality. IMPRESSION: 1. Persistent patchy airspace opacities with at least trace bilateral pleural effusions. 2. Possible developing mild pulmonary edema. 3. Followup PA and lateral chest X-ray is recommended in 3-4 weeks following therapy to ensure resolution. Electronically Signed   By: Tish Frederickson M.D.   On: 04/30/2023 14:02    Scheduled Meds:  aspirin  325 mg Oral  Daily   chlorthalidone  25 mg Oral Daily   cholecalciferol  2,000 Units Oral Daily   enoxaparin (LOVENOX) injection  40 mg Subcutaneous Q24H   finasteride  5 mg Oral Daily   hydrALAZINE  50 mg Oral TID   terazosin  5 mg Oral QHS   Continuous Infusions:  sodium chloride 100 mL/hr at 05/01/23 0510   azithromycin     cefTRIAXone (ROCEPHIN)  IV     lactated ringers 950 mL (04/30/23 1424)     LOS: 0 days   Burnadette Pop, MD [facility] P4/06/2023, 7:59 AM

## 2023-05-01 NOTE — Care Management Obs Status (Signed)
 MEDICARE OBSERVATION STATUS NOTIFICATION   Patient Details  Name: Patrick Villa MRN: [id number] Date of Birth: [birth year]   Medicare Observation Status Notification Given:  Yes    Adrian Prows, RN 05/01/2023, 9:30 AM

## 2023-05-01 NOTE — Progress Notes (Signed)
 Mobility Specialist - Progress Note   05/01/23 1225  Mobility  Activity Ambulated with assistance in hallway;Ambulated with assistance to bathroom  Level of Assistance Standby assist, set-up cues, supervision of patient - no hands on  Assistive Device Front wheel walker  Distance Ambulated (ft) 250 ft  Activity Response Tolerated well  Mobility Referral Yes  Mobility visit 1 Mobility  Mobility Specialist Start Time (ACUTE ONLY) 1212  Mobility Specialist Stop Time (ACUTE ONLY) 1224  Mobility Specialist Time Calculation (min) (ACUTE ONLY) 12 min   Pt received in bed and agreeable to mobility. Unable to determine SpO2 d/t pulse ox malfunction. Dyspnea with exertion. No complaints during session. Upon returning to room, pt requested assistance to the bathroom. Instructed pt to pull call bell when finished. Pt to bathroom after session with all needs met.    [facility]

## 2023-05-01 NOTE — Progress Notes (Signed)
 Echocardiogram 2D Echocardiogram has been performed.  Philippa Vessey N Srinivas Lippman,RDCS 05/01/2023, 12:06 PM

## 2023-05-01 NOTE — TOC Initial Note (Signed)
 Transition of Care [facility]) - Initial/Assessment Note    Patient Details  Name: Patrick Villa MRN: [id number] Date of Birth: [birth year]  [facility] CM/SW Contact:    Adrian Prows, RN Phone Number: 05/01/2023, 9:32 AM  Clinical Narrative:                 Spoke w/ pt in room; pt says he lives at home; he plans to return at d/c; pt identified POC Roney Marion (dtr) [phone]; he verified insurance/PCP; pt denied SDOH risks; he does not have DME, HH services, or home oxygen; TOC will follow.  Expected Discharge Plan: Home/Self Care Barriers to Discharge: Continued Medical Work up   Patient Goals and CMS Choice Patient states their goals for this hospitalization and ongoing recovery are:: home CMS Medicare.gov Compare Post Acute Care list provided to:: Patient   [place] ownership interest in [facility].provided to:: Patient    Expected Discharge Plan and Services   Discharge Planning Services: CM Consult   Living arrangements for the past 2 months: Single Family Home                                      Prior Living Arrangements/Services Living arrangements for the past 2 months: Single Family Home Lives with:: Self Patient language and need for interpreter reviewed:: Yes Do you feel safe going back to the place where you live?: Yes      Need for Family Participation in Patient Care: Yes (Comment) Care giver support system in place?: Yes (comment) Current home services:  (n/a) Criminal Activity/Legal Involvement Pertinent to Current Situation/Hospitalization: No - Comment as needed  Activities of Daily Living   ADL Screening (condition at time of admission) Independently performs ADLs?: Yes (appropriate for developmental age) Is the patient deaf or have difficulty hearing?: No Does the patient have difficulty seeing, even when wearing glasses/contacts?: No Does the patient have difficulty concentrating, remembering, or making  decisions?: No  Permission Sought/Granted Permission sought to share information with : Case Manager Permission granted to share information with : Yes, Verbal Permission Granted  Share Information with NAME: Case Manager     Permission granted to share info w Relationship: Roney Marion (dtr) [phone]     Emotional Assessment Appearance:: Appears stated age Attitude/Demeanor/Rapport: Gracious Affect (typically observed): Accepting Orientation: : Oriented to Self, Oriented to Place, Oriented to  Time, Oriented to Situation Alcohol / Substance Use: Not Applicable Psych Involvement: No (comment)  Admission diagnosis:  Hypoxia [R09.02] Pneumonia of both lungs due to infectious organism [J18.9] Pneumonia due to infectious organism, unspecified laterality, unspecified part of lung [J18.9] Patient Active Problem List   Diagnosis Date Noted   Pneumonia of both lungs due to infectious organism 04/30/2023   Hypokalemia 04/30/2023   AKI (acute kidney injury) (HCC) 04/30/2023   Pure hypercholesterolemia 10/27/2020   Prediabetes 10/27/2020   Bilateral foot pain 04/17/2020   ED (erectile dysfunction) 03/27/2019   BPH (benign prostatic hyperplasia) 05/23/2018   Second degree AV block, Mobitz type I 02/20/2018   Vitamin D deficiency 07/17/2014   Essential hypertension    COPD (chronic obstructive pulmonary disease) (HCC)    Hyperlipidemia    Colon polyps    Systolic murmur    Ganglion cyst    PCP:  Ollen Bowl, MD Pharmacy:   [facility] [id number] - [place], [place] - [address] AT [facility]  [address] [place] [address] Phone: [phone] Fax: [phone]  [facility] - [place], [place] - [address] [facility] [address] [place] [address] [place] [address] Phone: [phone] Fax: [phone]  [facility] - [place], [place] - [address] [place]  [address] Phone: [phone] Fax: [phone]  [facility] [address] [place] [address] Phone: [phone] Fax: [phone]  [id number] [place], [place] - [address] [facility] [address] [place] [address] Phone: [phone] Fax: [phone]     Social Drivers of Health (SDOH) Social History: SDOH Screenings   Food Insecurity: No Food Insecurity (05/01/2023)  Housing: Low Risk  (05/01/2023)  Transportation Needs: No Transportation Needs (05/01/2023)  Utilities: Not At Risk (05/01/2023)  Alcohol Screen: Low Risk  (04/30/2020)  Depression (PHQ2-9): Low Risk  (06/26/2020)  Financial Resource Strain: Low Risk  (08/23/2019)  Social Connections: Moderately Integrated (04/30/2023)  Tobacco Use: Medium Risk (04/30/2023)   SDOH Interventions: Food Insecurity Interventions: Intervention Not Indicated, Inpatient TOC Housing Interventions: Intervention Not Indicated, Inpatient TOC Transportation Interventions: Intervention Not Indicated, Inpatient TOC Utilities Interventions: Intervention Not Indicated, Inpatient TOC   Readmission Risk Interventions     No data to display

## 2023-05-02 ENCOUNTER — Inpatient Hospital Stay (HOSPITAL_COMMUNITY)

## 2023-05-02 DIAGNOSIS — R0902 Hypoxemia: Secondary | ICD-10-CM

## 2023-05-02 DIAGNOSIS — I5021 Acute systolic (congestive) heart failure: Secondary | ICD-10-CM

## 2023-05-02 DIAGNOSIS — I700 Atherosclerosis of aorta: Secondary | ICD-10-CM

## 2023-05-02 DIAGNOSIS — I493 Ventricular premature depolarization: Secondary | ICD-10-CM | POA: Diagnosis not present

## 2023-05-02 DIAGNOSIS — I10 Essential (primary) hypertension: Secondary | ICD-10-CM | POA: Diagnosis not present

## 2023-05-02 DIAGNOSIS — I2510 Atherosclerotic heart disease of native coronary artery without angina pectoris: Secondary | ICD-10-CM

## 2023-05-02 DIAGNOSIS — J189 Pneumonia, unspecified organism: Secondary | ICD-10-CM | POA: Diagnosis not present

## 2023-05-02 DIAGNOSIS — I441 Atrioventricular block, second degree: Secondary | ICD-10-CM

## 2023-05-02 LAB — BLOOD GAS, ARTERIAL
Acid-Base Excess: 7.7 mmol/L — ABNORMAL HIGH (ref 0.0–2.0)
Bicarbonate: 31 mmol/L — ABNORMAL HIGH (ref 20.0–28.0)
Drawn by: 331471
O2 Content: 4 L/min
O2 Saturation: 92.1 %
Patient temperature: 37
pCO2 arterial: 38 mmHg (ref 32–48)
pH, Arterial: 7.52 — ABNORMAL HIGH (ref 7.35–7.45)
pO2, Arterial: 60 mmHg — ABNORMAL LOW (ref 83–108)

## 2023-05-02 LAB — BASIC METABOLIC PANEL WITH GFR
Anion gap: 15 (ref 5–15)
BUN: 25 mg/dL — ABNORMAL HIGH (ref 8–23)
CO2: 26 mmol/L (ref 22–32)
Calcium: 9.6 mg/dL (ref 8.9–10.3)
Chloride: 100 mmol/L (ref 98–111)
Creatinine, Ser: 1.51 mg/dL — ABNORMAL HIGH (ref 0.61–1.24)
GFR, Estimated: 44 mL/min — ABNORMAL LOW (ref >60)
Glucose, Bld: 122 mg/dL — ABNORMAL HIGH (ref 70–99)
Potassium: 2.9 mmol/L — ABNORMAL LOW (ref 3.5–5.1)
Sodium: 141 mmol/L (ref 135–145)

## 2023-05-02 MED ORDER — IPRATROPIUM-ALBUTEROL 0.5-2.5 (3) MG/3ML IN SOLN
3.0000 mL | Freq: Four times a day (QID) | RESPIRATORY_TRACT | Status: DC
  Administered 2023-05-02: 3 mL via RESPIRATORY_TRACT
  Filled 2023-05-02: qty 3

## 2023-05-02 MED ORDER — MAGNESIUM SULFATE 2 GM/50ML IV SOLN
2.0000 g | Freq: Once | INTRAVENOUS | Status: AC
  Administered 2023-05-02: 2 g via INTRAVENOUS
  Filled 2023-05-02: qty 50

## 2023-05-02 MED ORDER — POTASSIUM CHLORIDE CRYS ER 20 MEQ PO TBCR
40.0000 meq | EXTENDED_RELEASE_TABLET | ORAL | Status: AC
  Administered 2023-05-02 (×2): 40 meq via ORAL
  Filled 2023-05-02 (×2): qty 2

## 2023-05-02 MED ORDER — DOXAZOSIN MESYLATE 1 MG PO TABS
2.0000 mg | ORAL_TABLET | Freq: Two times a day (BID) | ORAL | Status: DC
  Administered 2023-05-02 – 2023-05-06 (×9): 2 mg via ORAL
  Filled 2023-05-02 (×9): qty 1

## 2023-05-02 MED ORDER — IPRATROPIUM-ALBUTEROL 0.5-2.5 (3) MG/3ML IN SOLN
RESPIRATORY_TRACT | Status: AC
  Administered 2023-05-02: 3 mL via RESPIRATORY_TRACT
  Filled 2023-05-02: qty 3

## 2023-05-02 NOTE — Evaluation (Signed)
 Physical Therapy Evaluation Patient Details Name: Patrick Villa MRN: [id number] DOB: [birth year] Today's Date: 05/02/2023  History of Present Illness  89 yo male admitted with Pna, HF. Hx of COPD, DM, BPH  Clinical Impression  Upon arrival, pt was resting in bed on 5L [place]. Assessed O2, sats 86%. Spoke with RN who bumped pt up to 7L-sats up to 89%. Pt was not in distress. Had pt sit up EOB on 7L and sats dropped to 73% and pt dyspneic. Immediately had pt return to supine. Bumped O2 up to 9L. Made RN aware. Sats eventually recovered to 88%. RN in to room to assess and manage. Will plan to follow and progress activity as tolerated.         If plan is discharge home, recommend the following: A little help with walking and/or transfers   Can travel by private vehicle        Equipment Recommendations None recommended by PT  Recommendations for Other Services       Functional Status Assessment Patient has had a recent decline in their functional status and demonstrates the ability to make significant improvements in function in a reasonable and predictable amount of time.     Precautions / Restrictions Precautions Precautions: Fall Precaution/Restrictions Comments: monitor O2 Restrictions Weight Bearing Restrictions Per Provider Order: No Other Position/Activity Restrictions: WBAT      Mobility  Bed Mobility Overal bed mobility: Modified Independent             General bed mobility comments: O2 dropped to 73% on 7L. Immediately had pt return to supine. Cues for deep breathing. Bumped [place] O2 up to 9L to aid recovery. Sats eventually recovered to 88%. RN made aware.    Transfers                        Ambulation/Gait                  Stairs            Wheelchair Mobility     Tilt Bed    Modified Rankin (Stroke Patients Only)       Balance                                             Pertinent Vitals/Pain Pain  Assessment Pain Assessment: 0-10    Home Living Family/patient expects to be discharged to:: Private residence Living Arrangements: Spouse/significant other (live in separate homes) Available Help at Discharge: Family;Available PRN/intermittently Type of Home: House Home Access: Stairs to enter   [employer] of Steps: 1   Home Layout: One level Home Equipment: None      Prior Function Prior Level of Function : Independent/Modified Independent             Mobility Comments: very active       Extremity/Trunk Assessment   Upper Extremity Assessment Upper Extremity Assessment: Overall WFL for tasks assessed    Lower Extremity Assessment Lower Extremity Assessment: Overall WFL for tasks assessed    Cervical / Trunk Assessment Cervical / Trunk Assessment: Normal  Communication   Communication Communication: No apparent difficulties    Cognition Arousal: Alert Behavior During Therapy: WFL for tasks assessed/performed   PT - Cognitive impairments: No apparent impairments  Following commands: Intact       Cueing Cueing Techniques: Verbal cues     General Comments      Exercises     Assessment/Plan    PT Assessment Patient needs continued PT services  PT Problem List Decreased strength;Decreased range of motion;Decreased activity tolerance;Decreased balance;Decreased mobility;Decreased knowledge of use of DME       PT Treatment Interventions DME instruction;Gait training;Functional mobility training;Therapeutic activities;Therapeutic exercise;Patient/family education;Balance training    PT Goals (Current goals can be found in the Care Plan section)  Acute Rehab PT Goals Patient Stated Goal: regain PLOF PT Goal Formulation: With patient Time For Goal Achievement: 05/16/23 Potential to Achieve Goals: Good    Frequency Min 2X/week     Co-evaluation               AM-PAC PT "6 Clicks" Mobility   Outcome Measure Help needed turning from your back to your side while in a flat bed without using bedrails?: None Help needed moving from lying on your back to sitting on the side of a flat bed without using bedrails?: None Help needed moving to and from a bed to a chair (including a wheelchair)?: A Lot Help needed standing up from a chair using your arms (e.g., wheelchair or bedside chair)?: A Lot Help needed to walk in hospital room?: A Lot Help needed climbing 3-5 steps with a railing? : A Lot 6 Click Score: 16    End of Session   Activity Tolerance: Patient tolerated treatment well Patient left: in bed;with call bell/phone within reach;with family/visitor present;with bed alarm set   PT Visit Diagnosis: Difficulty in walking, not elsewhere classified (R26.2)    Time: 1610-9604 PT Time Calculation (min) (ACUTE ONLY): 28 min   Charges:   PT Evaluation $PT Eval Low Complexity: 1 Low PT Treatments $Therapeutic Activity: 8-22 mins PT General Charges $$ ACUTE PT VISIT: 1 Visit           Faye Ramsay, PT Acute Rehabilitation  Office: [phone]

## 2023-05-02 NOTE — Plan of Care (Signed)
  Problem: Education: Goal: Knowledge of General Education information will improve Description: Including pain rating scale, medication(s)/side effects and non-pharmacologic comfort measures Outcome: Progressing   Problem: Health Behavior/Discharge Planning: Goal: Ability to manage health-related needs will improve Outcome: Progressing   Problem: Clinical Measurements: Goal: Ability to maintain clinical measurements within normal limits will improve Outcome: Progressing Goal: Will remain free from infection Outcome: Progressing Goal: Diagnostic test results will improve Outcome: Progressing Goal: Respiratory complications will improve Outcome: Progressing Goal: Cardiovascular complication will be avoided Outcome: Progressing   Problem: Pain Managment: Goal: General experience of comfort will improve and/or be controlled Outcome: Progressing   Problem: Safety: Goal: Ability to remain free from injury will improve Outcome: Progressing   Problem: Skin Integrity: Goal: Risk for impaired skin integrity will decrease Outcome: Progressing   Problem: Activity: Goal: Ability to tolerate increased activity will improve Outcome: Progressing   Problem: Clinical Measurements: Goal: Ability to maintain a body temperature in the normal range will improve Outcome: Progressing   Problem: Respiratory: Goal: Ability to maintain adequate ventilation will improve Outcome: Progressing Goal: Ability to maintain a clear airway will improve Outcome: Progressing

## 2023-05-02 NOTE — Plan of Care (Signed)
  Problem: Education: Goal: Knowledge of General Education information will improve Description: Including pain rating scale, medication(s)/side effects and non-pharmacologic comfort measures Outcome: Progressing   Problem: Health Behavior/Discharge Planning: Goal: Ability to manage health-related needs will improve Outcome: Progressing   Problem: Clinical Measurements: Goal: Ability to maintain clinical measurements within normal limits will improve Outcome: Not Progressing Goal: Diagnostic test results will improve Outcome: Not Progressing Goal: Respiratory complications will improve Outcome: Not Progressing Goal: Cardiovascular complication will be avoided Outcome: Progressing

## 2023-05-02 NOTE — Progress Notes (Addendum)
 PROGRESS NOTE  FADI MENTER  QMV:[id number] DOB: [birth year] DOA: 04/30/2023 PCP: Ollen Bowl, MD   Brief Narrative: Patient is a 89 year old male with history of COPD, diabetes mellitus, hyperlipidemia, hypertension, BPH who presented to the device department with complaint of generalized weakness, shortness of breath, nonproductive cough, fever.  On presentation,he was hypoxic at 87%.  She was recently treated for multifocal pneumonia with Augmentin and doxycycline as an outpatient but continued to have symptoms.  On presentation, he was febrile up to one 101 .79F, saturating 89% on room air, potassium 3.1, creatinine 1.5.  Chest x-ray showed patchy airspace opacity with bilateral pleural effusion, possible mild pulmonary edema.  Patient admitted for further management of multifocal pneumonia not responding to outpatient antibiotic therapy.  Also suspected to have volume overload, started on IV Lasix.  Echo showed EF of 40 to 45%, cardiology also consulted.  Assessment & Plan:  Principal Problem:   Pneumonia of both lungs due to infectious organism Active Problems:   Essential hypertension   COPD (chronic obstructive pulmonary disease) (HCC)   Hyperlipidemia   BPH (benign prostatic hyperplasia)   Hypokalemia   AKI (acute kidney injury) (HCC)   Multifocal pneumonia/community-acquired pneumonia:He was recently treated for multifocal pneumonia with Augmentin and doxycycline as an outpatient but continued to have symptoms.  Chest x-ray showed patchy airspace opacity with suspicion of bilateral pleural effusion, possible mild pulmonary edema.  Started on broad-spectrum antibiotics.  Lactic acid was normal on presentation.  Follow-up cultures.Had fever of 100.4 this mrng  Acute HFrEF: Does not have peripheral edema but had bilateral crackles.  Chest imaging suggestive of features of pulmonary edema.  Elevated BNP as per 4/1.  Given  2 doses of Lasix IV 40 mg on 4/6.  Echo showed EF of 40 to  45%, global hypokinesis, grade 1 diastolic dysfunction.  Cardiology consulted.  Lasix on hold because creatinine trended up  Acute hypoxic respiratory/COPD: Not on on oxygen at home.  Currently on 2 L of oxygen.  Continue to wean and monitor  Hypokalemia: Currently being supplemented and monitored.  Mag of 1.7, supplemented as well.  AKI: Creatinine trended up to 1.5.  Baseline creatinine normal.  Will continue to hold Lasix for now  Hypertension: Currently hypertensive.  Continue to  monitor blood pressure.  Continue home medications: Chlorthalidone, hydralazine.Continue PRN medication for severe hypertension.  May need to be started on beta-blocker, spironolactone,will await cardiology eval  MWU:[id number] terazosin,proscar  Debility/deconditioning: Patient lives alone.  Ambulates well.  He daughter lives just a few minutes away.  Will consult PT/OT          DVT prophylaxis:enoxaparin (LOVENOX) injection 40 mg Start: 04/30/23 2200     Code Status: Full Code  Family Communication: Called and discussed with daughter Eber Jones on phone on 4/7  Patient status:Inpatient  Patient is from :Home  Anticipated discharge UV:OZDG  Estimated DC date: 2 to 3 days   Consultants: None  Procedures:None  Antimicrobials:  Anti-infectives (From admission, onward)    Start     Dose/Rate Route Frequency Ordered Stop   05/01/23 1000  cefTRIAXone (ROCEPHIN) 2 g in sodium chloride 0.9 % 100 mL IVPB        2 g 200 mL/hr over 30 Minutes Intravenous Every 24 hours 04/30/23 1844 05/06/23 0959   05/01/23 1000  azithromycin (ZITHROMAX) 500 mg in sodium chloride 0.9 % 250 mL IVPB        500 mg 250 mL/hr over 60 Minutes Intravenous Every 24 hours 04/30/23  1844 05/06/23 0959   04/30/23 1345  cefTRIAXone (ROCEPHIN) 2 g in sodium chloride 0.9 % 100 mL IVPB        2 g 200 mL/hr over 30 Minutes Intravenous Once 04/30/23 1332 04/30/23 1455   04/30/23 1345  azithromycin (ZITHROMAX) 500 mg in sodium  chloride 0.9 % 250 mL IVPB        500 mg 250 mL/hr over 60 Minutes Intravenous  Once 04/30/23 1332 04/30/23 1534       Subjective: Patient seen and examined at bedside today.  Hemodynamically stable.  On 2 L of oxygen.  He says he feels better today.  Does not look dyspneic or short of breath during my evaluation.  Does not have peripheral edema.  Has coarse breathing sounds bilaterally with crackles in bases.  Had a fever of 100.44 at this morning  Objective: Vitals:   05/01/23 1927 05/01/23 2006 05/02/23 0403 05/02/23 0917  BP: (!) 153/73 (!) 173/77 (!) 156/82 (!) 155/72  Pulse: 82 87 90   Resp: 20 16 18    Temp: 99.8 F (37.7 C) 98.8 F (37.1 C) (!) 100.4 F (38 C)   TempSrc: Oral Oral Oral   SpO2: (!) 89% 90% (!) 86%   Weight:      Height:        Intake/Output Summary (Last 24 hours) at 05/02/2023 1000 Last data filed at 05/02/2023 0408 Gross per 24 hour  Intake 120 ml  Output 1900 ml  Net -1780 ml   Filed Weights   04/30/23 1234 04/30/23 1813  Weight: 83.5 kg 83.5 kg    Examination:   General exam: Overall comfortable, not in distress HEENT: PERRL Respiratory system:  rhonchi, , bibasilar crackles Cardiovascular system: S1 & S2 heard, RRR.  Gastrointestinal system: Abdomen is nondistended, soft and nontender. Central nervous system: Alert and oriented Extremities: No edema, no clubbing ,no cyanosis Skin: No rashes, no ulcers,no icterus     Data Reviewed: I have personally reviewed following labs and imaging studies  CBC: Recent Labs  Lab 04/26/23 1323 04/30/23 1350 05/01/23 0515  WBC 7.0 6.5 6.3  NEUTROABS 5.4 5.2 4.6  HGB 11.9* 10.9* 10.5*  HCT 36.8* 34.0* 34.1*  MCV 84.0 83.1 87.4  PLT 246 270 232   Basic Metabolic Panel: Recent Labs  Lab 04/26/23 1429 04/30/23 1350 05/01/23 0515 05/02/23 0446  NA 137 138 140 141  K 3.3* 3.1* 3.1* 2.9*  CL 100 101 105 100  CO2 28 26 24 26   GLUCOSE 97 110* 96 122*  BUN 22 24* 21 25*  CREATININE 1.24  1.52* 1.18 1.51*  CALCIUM 9.5 9.9 9.0 9.6  MG  --   --  1.7  --      Recent Results (from the past 240 hours)  Resp panel by RT-PCR (RSV, Flu A&B, Covid) Anterior Nasal Swab     Status: None   Collection Time: 04/26/23  1:23 PM   Specimen: Anterior Nasal Swab  Result Value Ref Range Status   SARS Coronavirus 2 by RT PCR NEGATIVE NEGATIVE Final    Comment: (NOTE) SARS-CoV-2 target nucleic acids are NOT DETECTED.  The SARS-CoV-2 RNA is generally detectable in upper respiratory specimens during the acute phase of infection. The lowest concentration of SARS-CoV-2 viral copies this assay can detect is 138 copies/mL. A negative result does not preclude SARS-Cov-2 infection and should not be used as the sole basis for treatment or other patient management decisions. A negative result may occur with  improper specimen  collection/handling, submission of specimen other than nasopharyngeal swab, presence of viral mutation(s) within the areas targeted by this assay, and inadequate number of viral copies(<138 copies/mL). A negative result must be combined with clinical observations, patient history, and epidemiological information. The expected result is Negative.  Fact Sheet for Patients:  BloggerCourse.com  Fact Sheet for Healthcare Providers:  SeriousBroker.it  This test is no t yet approved or cleared by the [place] FDA and  has been authorized for detection and/or diagnosis of SARS-CoV-2 by FDA under an Emergency Use Authorization (EUA). This EUA will remain  in effect (meaning this test can be used) for the duration of the COVID-19 declaration under Section 564(b)(1) of the Act, 21 U.S.C.section 360bbb-3(b)(1), unless the authorization is terminated  or revoked sooner.       Influenza A by PCR NEGATIVE NEGATIVE Final   Influenza B by PCR NEGATIVE NEGATIVE Final    Comment: (NOTE) The Xpert Xpress SARS-CoV-2/FLU/RSV plus  assay is intended as an aid in the diagnosis of influenza from Nasopharyngeal swab specimens and should not be used as a sole basis for treatment. Nasal washings and aspirates are unacceptable for Xpert Xpress SARS-CoV-2/FLU/RSV testing.  Fact Sheet for Patients: BloggerCourse.com  Fact Sheet for Healthcare Providers: SeriousBroker.it  This test is not yet approved or cleared by the [place] FDA and has been authorized for detection and/or diagnosis of SARS-CoV-2 by FDA under an Emergency Use Authorization (EUA). This EUA will remain in effect (meaning this test can be used) for the duration of the COVID-19 declaration under Section 564(b)(1) of the Act, 21 U.S.C. section 360bbb-3(b)(1), unless the authorization is terminated or revoked.     Resp Syncytial Virus by PCR NEGATIVE NEGATIVE Final    Comment: (NOTE) Fact Sheet for Patients: BloggerCourse.com  Fact Sheet for Healthcare Providers: SeriousBroker.it  This test is not yet approved or cleared by the [place] FDA and has been authorized for detection and/or diagnosis of SARS-CoV-2 by FDA under an Emergency Use Authorization (EUA). This EUA will remain in effect (meaning this test can be used) for the duration of the COVID-19 declaration under Section 564(b)(1) of the Act, 21 U.S.C. section 360bbb-3(b)(1), unless the authorization is terminated or revoked.  Performed at [facility], [address] [place], [place] [address]   Blood Culture (routine x 2)     Status: None (Preliminary result)   Collection Time: 04/30/23  1:40 PM   Specimen: BLOOD  Result Value Ref Range Status   Specimen Description   Final    BLOOD RIGHT ANTECUBITAL Performed at [facility], [address], [place], [place] [address]    Special Requests   Final    BOTTLES DRAWN AEROBIC AND ANAEROBIC Blood Culture  results may not be optimal due to an excessive volume of blood received in culture bottles Performed at [facility], [address] [place], [place] [address]    Culture   Final    NO GROWTH 2 DAYS Performed at [facility], [address] [place], [place] [address]    Report Status PENDING  Incomplete  Resp panel by RT-PCR (RSV, Flu A&B, Covid) Anterior Nasal Swab     Status: None   Collection Time: 04/30/23  1:50 PM   Specimen: Anterior Nasal Swab  Result Value Ref Range Status   SARS Coronavirus 2 by RT PCR NEGATIVE NEGATIVE Final    Comment: (NOTE) SARS-CoV-2 target nucleic acids are NOT DETECTED.  The SARS-CoV-2 RNA is  generally detectable in upper respiratory specimens during the acute phase of infection. The lowest concentration of SARS-CoV-2 viral copies this assay can detect is 138 copies/mL. A negative result does not preclude SARS-Cov-2 infection and should not be used as the sole basis for treatment or other patient management decisions. A negative result may occur with  improper specimen collection/handling, submission of specimen other than nasopharyngeal swab, presence of viral mutation(s) within the areas targeted by this assay, and inadequate number of viral copies(<138 copies/mL). A negative result must be combined with clinical observations, patient history, and epidemiological information. The expected result is Negative.  Fact Sheet for Patients:  BloggerCourse.com  Fact Sheet for Healthcare Providers:  SeriousBroker.it  This test is no t yet approved or cleared by the [place] FDA and  has been authorized for detection and/or diagnosis of SARS-CoV-2 by FDA under an Emergency Use Authorization (EUA). This EUA will remain  in effect (meaning this test can be used) for the duration of the COVID-19 declaration under Section 564(b)(1) of the Act, 21 U.S.C.section 360bbb-3(b)(1),  unless the authorization is terminated  or revoked sooner.       Influenza A by PCR NEGATIVE NEGATIVE Final   Influenza B by PCR NEGATIVE NEGATIVE Final    Comment: (NOTE) The Xpert Xpress SARS-CoV-2/FLU/RSV plus assay is intended as an aid in the diagnosis of influenza from Nasopharyngeal swab specimens and should not be used as a sole basis for treatment. Nasal washings and aspirates are unacceptable for Xpert Xpress SARS-CoV-2/FLU/RSV testing.  Fact Sheet for Patients: BloggerCourse.com  Fact Sheet for Healthcare Providers: SeriousBroker.it  This test is not yet approved or cleared by the [place] FDA and has been authorized for detection and/or diagnosis of SARS-CoV-2 by FDA under an Emergency Use Authorization (EUA). This EUA will remain in effect (meaning this test can be used) for the duration of the COVID-19 declaration under Section 564(b)(1) of the Act, 21 U.S.C. section 360bbb-3(b)(1), unless the authorization is terminated or revoked.     Resp Syncytial Virus by PCR NEGATIVE NEGATIVE Final    Comment: (NOTE) Fact Sheet for Patients: BloggerCourse.com  Fact Sheet for Healthcare Providers: SeriousBroker.it  This test is not yet approved or cleared by the [place] FDA and has been authorized for detection and/or diagnosis of SARS-CoV-2 by FDA under an Emergency Use Authorization (EUA). This EUA will remain in effect (meaning this test can be used) for the duration of the COVID-19 declaration under Section 564(b)(1) of the Act, 21 U.S.C. section 360bbb-3(b)(1), unless the authorization is terminated or revoked.  Performed at [facility], [address] [place], [place] [address]   Blood Culture (routine x 2)     Status: None (Preliminary result)   Collection Time: 04/30/23  2:05 PM   Specimen: BLOOD LEFT FOREARM  Result Value Ref  Range Status   Specimen Description   Final    BLOOD LEFT FOREARM Performed at [facility], [address], [place], [place] [address]    Special Requests   Final    BOTTLES DRAWN AEROBIC AND ANAEROBIC Blood Culture adequate volume Performed at [facility], [address] [place], [place] [address]    Culture   Final    NO GROWTH 2 DAYS Performed at [facility], [address], [place], [place] [address]    Report Status PENDING  Incomplete     Radiology Studies: ECHOCARDIOGRAM COMPLETE Result Date: 05/01/2023    ECHOCARDIOGRAM REPORT   Patient Name:  Marlene Lard Date of Exam: 05/01/2023 Medical Rec #:  [id number]     Height:       66.0 in Accession #:    [id number]    Weight:       184.1 lb Date of Birth:  November 15, 1933     BSA:          1.931 m Patient Age:    89 years      BP:           174/80 mmHg Patient Gender: M             HR:           90 bpm. Exam Location:  Inpatient Procedure: 2D Echo, Color Doppler, Cardiac Doppler and Intracardiac            Opacification Agent (Both Spectral and Color Flow Doppler were            utilized during procedure). Indications:    CHF-Diastolic  History:        Patient has prior history of Echocardiogram examinations, most                 recent 01/28/2018. COPD; Risk Factors:Diabetes, Dyslipidemia and                 Former Smoker.  Sonographer:    Raeford Razor RDCS Referring Phys: [phone] Marbin Olshefski IMPRESSIONS  1. Left ventricular ejection fraction, by estimation, is 40 to 45%. The left ventricle has mildly decreased function. The left ventricle demonstrates global hypokinesis. There is mild left ventricular hypertrophy. Left ventricular diastolic parameters are consistent with Grade I diastolic dysfunction (impaired relaxation).  2. Right ventricular systolic function is normal. The right ventricular size is normal.  3. Left atrial size was moderately dilated.  4. The mitral valve is normal in structure. No  evidence of mitral valve regurgitation. No evidence of mitral stenosis.  5. The aortic valve is calcified. There is moderate calcification of the aortic valve. There is mild thickening of the aortic valve. Aortic valve regurgitation is trivial. Aortic valve sclerosis is present, with no evidence of aortic valve stenosis. Aortic valve mean gradient measures 7.0 mmHg. Aortic valve Vmax measures 1.78 m/s.  6. The inferior vena cava is normal in size with greater than 50% respiratory variability, suggesting right atrial pressure of 3 mmHg. FINDINGS  Left Ventricle: Left ventricular ejection fraction, by estimation, is 40 to 45%. The left ventricle has mildly decreased function. The left ventricle demonstrates global hypokinesis. Definity contrast agent was given IV to delineate the left ventricular  endocardial borders. The left ventricular internal cavity size was normal in size. There is mild left ventricular hypertrophy. Left ventricular diastolic parameters are consistent with Grade I diastolic dysfunction (impaired relaxation). Right Ventricle: The right ventricular size is normal. No increase in right ventricular wall thickness. Right ventricular systolic function is normal. Left Atrium: Left atrial size was moderately dilated. Right Atrium: Right atrial size was normal in size. Pericardium: There is no evidence of pericardial effusion. Mitral Valve: The mitral valve is normal in structure. No evidence of mitral valve regurgitation. No evidence of mitral valve stenosis. Tricuspid Valve: The tricuspid valve is normal in structure. Tricuspid valve regurgitation is not demonstrated. No evidence of tricuspid stenosis. Aortic Valve: The aortic valve is calcified. There is moderate calcification of the aortic valve. There is mild thickening of the aortic valve. Aortic valve regurgitation is trivial. Aortic valve sclerosis is present, with no evidence of aortic valve  stenosis. Aortic valve mean gradient measures 7.0  mmHg. Aortic valve peak gradient measures 12.7 mmHg. Aortic valve area, by VTI measures 1.78 cm. Pulmonic Valve: The pulmonic valve was normal in structure. Pulmonic valve regurgitation is not visualized. No evidence of pulmonic stenosis. Aorta: The aortic root is normal in size and structure. Venous: The inferior vena cava is normal in size with greater than 50% respiratory variability, suggesting right atrial pressure of 3 mmHg. IAS/Shunts: No atrial level shunt detected by color flow Doppler.  LEFT VENTRICLE PLAX 2D LVIDd:         5.23 cm      Diastology LVIDs:         3.60 cm      LV e' medial:   9.25 cm/s LV PW:         1.07 cm      LV E/e' medial: 8.8 LV IVS:        1.23 cm LVOT diam:     2.10 cm LV SV:         56 LV SV Index:   29 LVOT Area:     3.46 cm  LV Volumes (MOD) LV vol d, MOD A2C: 165.0 ml LV vol d, MOD A4C: 103.0 ml LV vol s, MOD A2C: 93.7 ml LV vol s, MOD A4C: 51.5 ml LV SV MOD A2C:     71.3 ml LV SV MOD A4C:     103.0 ml LV SV MOD BP:      65.6 ml RIGHT VENTRICLE RV Basal diam:  3.10 cm RV Mid diam:    1.80 cm RV S prime:     17.50 cm/s TAPSE (M-mode): 2.7 cm LEFT ATRIUM              Index        RIGHT ATRIUM           Index LA diam:        3.20 cm  1.66 cm/m   RA Area:     17.60 cm LA Vol (A2C):   134.0 ml 69.41 ml/m  RA Volume:   47.50 ml  24.60 ml/m LA Vol (A4C):   64.5 ml  33.41 ml/m LA Biplane Vol: 94.4 ml  48.90 ml/m  AORTIC VALVE AV Area (Vmax):    1.82 cm AV Area (Vmean):   1.74 cm AV Area (VTI):     1.78 cm AV Vmax:           178.00 cm/s AV Vmean:          124.000 cm/s AV VTI:            0.313 m AV Peak Grad:      12.7 mmHg AV Mean Grad:      7.0 mmHg LVOT Vmax:         93.60 cm/s LVOT Vmean:        62.400 cm/s LVOT VTI:          0.161 m LVOT/AV VTI ratio: 0.51  AORTA Ao Root diam: 3.30 cm Ao Asc diam:  3.20 cm MITRAL VALVE MV Area (PHT): 6.02 cm     SHUNTS MV Decel Time: 126 msec     Systemic VTI:  0.16 m MV E velocity: 81.50 cm/s   Systemic Diam: 2.10 cm MV A velocity:  139.00 cm/s MV E/A ratio:  0.59 Donato Schultz MD Electronically signed by Donato Schultz MD Signature Date/Time: 05/01/2023/2:24:07 PM    Final    DG Chest Port 1 [address]  Result Date: 04/30/2023 : Questionable sepsis - evaluate for abnormality EXAM: PORTABLE CHEST 1 VIEW COMPARISON:  Chest x-ray 04/26/2023, CT chest 04/26/2023 FINDINGS: The heart and mediastinal contours are unchanged. Persistent patchy airspace opacities. Slightly increased perihilar interstitial markings. At least trace bilateral pleural effusions pleural effusion. No pneumothorax. No acute osseous abnormality. IMPRESSION: 1. Persistent patchy airspace opacities with at least trace bilateral pleural effusions. 2. Possible developing mild pulmonary edema. 3. Followup PA and lateral chest X-ray is recommended in 3-4 weeks following therapy to ensure resolution. Electronically Signed   By: Tish Frederickson M.D.   On: 04/30/2023 14:02    Scheduled Meds:  aspirin  325 mg Oral Daily   chlorthalidone  25 mg Oral Daily   cholecalciferol  2,000 Units Oral Daily   enoxaparin (LOVENOX) injection  40 mg Subcutaneous Q24H   finasteride  5 mg Oral Daily   hydrALAZINE  50 mg Oral TID   potassium chloride  40 mEq Oral Q2H   terazosin  5 mg Oral QHS   Continuous Infusions:  azithromycin 500 mg (05/02/23 0923)   cefTRIAXone (ROCEPHIN)  IV Stopped (05/01/23 1324)   magnesium sulfate bolus IVPB       LOS: 1 day   Burnadette Pop, MD [facility] P4/07/2023, 10:00 AM

## 2023-05-02 NOTE — Progress Notes (Signed)
 RT placed pt on 7 LPM HFNC due to low sats RN aware.

## 2023-05-02 NOTE — Consult Note (Addendum)
 Cardiology Consultation   Patient ID: Patrick Villa MRN: [id number]; DOB: 06-13-1933  Admit date: 04/30/2023 Date of Consult: 05/02/2023  PCP:  Ollen Bowl, MD   [facility] Providers Cardiologist:  Patrick Red, MD        Patient Profile:   Patrick Villa is Villa 89 y.o. male with Villa hx of second-degree AV block type I, hyperlipidemia, hypertension, and COPD who is being seen 05/02/2023 for the evaluation of congestive heart failure at the request of Patrick Villa.  History of Present Illness:   Patrick Villa was hospitalized 2020 and cardiology was consulted for atypical chest pain.  He was hypertensive and found to have second-degree type I Wenckebach on telemetry prompting discontinuation of Cardizem.  Since that time he has follow-up with Dr. Cristal Villa in the clinic and has been somewhat resistant to medication changes.  He did not tolerate amlodipine, spironolactone, higher doses of hydralazine, and is not Villa candidate for Cardizem, verapamil, or beta-blockers due to Wenckebach. Reassuring nuclear stress test in 2021 with no ischemia, but LVEF 48%.   He was last seen in clinic 01/2023 and BP was not at goal.  He was not interested in any medication changes at that time. Home medication regimen at last visit was 50 mg hydralazine (sounds like he wasn't taking this TID?) and 25 mg chlorthalidone.   He was seen in the ER/1/25 with bilateral pneumonia treated with outpatient p.o. antibiotic.  Unfortunately he presented back to the ER 04/30/23 with persistent SOB and fevers.   CXR concerning for pulmonary edema. Follow up echo shows newly recognized LVEF 40-45% with grade 1 DD.   Cardiology consulted for heart failure.   K 3.1 --> 2.9 Mg 1.7 sCr 1.52 --> 1.18 --> 1.51 - baseline 1.2  Remains on chlorthalidone 25 mg, has received 40 mg IV lasix x 2 doses. Has 1.9 L urine output charted.    EKG with SR but PVCs and bigeminy present, LBBB.   He reports feeling much  improved today after lasix and IV ABX. He reports DOE, no shortness of breath at rest, orthopnea, lower extremity edema, or weight gain. His daughter also reports Villa recent tick bite.   He is unaware of PVCs and denies chest pain. He reports he is taking spironolactone three times daily. He reports he is mindful of salt but does have restaurant food. He takes care of ADLs at home.   I spoke with his daughter Patrick Villa who also reports Villa tick bite on 04/26/23 and she requested RMSF testing, I do not see this in Epic.   Past Medical History:  Diagnosis Date   Colon polyps    COPD (chronic obstructive pulmonary disease) (HCC)    Diabetes mellitus without complication (HCC) 05/01/2020   Echocardiogram abnormal    Ganglion cyst    Hematuria    Hyperlipidemia    Hypertension    Systolic murmur     Past Surgical History:  Procedure Laterality Date   COLON SURGERY     TRANSTHORACIC ECHOCARDIOGRAM       Home Medications:  Prior to Admission medications   Medication Sig Start Date End Date Taking? Authorizing Provider  acetaminophen (TYLENOL) 650 MG CR tablet Take 1,300 mg by mouth every 8 (eight) hours as needed for pain.   Yes [provider]  chlorthalidone (HYGROTON) 25 MG tablet Take 1 tablet (25 mg total) by mouth daily. 2nd attempt. Pt needs yearly appt for any future refills. Please call office to  schedule appt @ [phone]. Patient taking differently: Take 25 mg by mouth in the morning. 2nd attempt. Pt needs yearly appt for any future refills. Please call office to schedule appt @ [phone]. 12/30/22  Yes Patrick Red, MD  finasteride (PROSCAR) 5 MG tablet Take 1 tablet (5 mg total) by mouth daily. Patient taking differently: Take 5 mg by mouth in the morning. 02/25/21  Yes Patrick Marseilles A, NP  hydrALAZINE (APRESOLINE) 50 MG tablet TAKE 1 TABLET BY MOUTH 3 TIMES  DAILY Patient taking differently: Take 50 mg by mouth in the morning and at bedtime. 11/18/22  Yes  Patrick Red, MD  terazosin (HYTRIN) 5 MG capsule Take 5 mg by mouth at bedtime.   Yes [provider]    Inpatient Medications: Scheduled Meds:  aspirin  325 mg Oral Daily   chlorthalidone  25 mg Oral Daily   cholecalciferol  2,000 Units Oral Daily   enoxaparin (LOVENOX) injection  40 mg Subcutaneous Q24H   finasteride  5 mg Oral Daily   hydrALAZINE  50 mg Oral TID   potassium chloride  40 mEq Oral Q2H   terazosin  5 mg Oral QHS   Continuous Infusions:  azithromycin 500 mg (05/02/23 0923)   cefTRIAXone (ROCEPHIN)  IV Stopped (05/01/23 2956)   magnesium sulfate bolus IVPB     PRN Meds: acetaminophen, diclofenac, labetalol, mouth rinse  Allergies:    Allergies  Allergen Reactions   Amlodipine Other (See Comments)    Patient developed jitteriness, tremors and unstable gait.  Same issues when re-challenged   Lipitor [Atorvastatin Calcium]     Weakness.   Zocor [Simvastatin]    Bactrim [Sulfamethoxazole-Trimethoprim] Rash   Pravachol Hives and Rash    Social History:   Social History   Socioeconomic History   Marital status: Widowed    Spouse name: Not on file   Number of children: Not on file   Years of education: Not on file   Highest education level: Not on file  Occupational History   Not on file  Tobacco Use   Smoking status: Former    Current packs/day: 0.00    Types: Cigarettes    Quit date: 10/18/1981    Years since quitting: 41.5   Smokeless tobacco: Never  Vaping Use   Vaping status: Never Used  Substance and Sexual Activity   Alcohol use: No   Drug use: No   Sexual activity: Not Currently  Other Topics Concern   Not on file  Social History Narrative   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   No other exercise   Plays guitar.   Separated. Lives alone.   Social Drivers of Corporate investment banker Strain: Low Risk  (08/23/2019)   Overall Financial Resource Strain (CARDIA)    Difficulty of Paying Living  Expenses: Not very hard  Food Insecurity: No Food Insecurity (05/01/2023)   Hunger Vital Sign    Worried About Running Out of Food in the Last Year: Never true    Ran Out of Food in the Last Year: Never true  Transportation Needs: No Transportation Needs (05/01/2023)   PRAPARE - Administrator, Civil Service (Medical): No    Lack of Transportation (Non-Medical): No  Physical Activity: Not on file  Stress: Not on file  Social Connections: Moderately Integrated (04/30/2023)   Social Connection and Isolation Panel [NHANES]    Frequency of Communication with Friends and Family: Three times Villa week    Frequency  of Social Gatherings with Friends and Family: More than three times Villa week    Attends Religious Services: More than 4 times per year    Active Member of [employer] or Organizations: Yes    Attends Engineer, structural: More than 4 times per year    Marital Status: Divorced  Intimate Partner Violence: Not At Risk (05/01/2023)   Humiliation, Afraid, Rape, and Kick questionnaire    Fear of Current or Ex-Partner: No    Emotionally Abused: No    Physically Abused: No    Sexually Abused: No    Family History:    Family History  Problem Relation Age of Onset   Diabetes Mother    Stroke Brother      ROS:  Please see the history of present illness.   All other ROS reviewed and negative.     Physical Exam/Data:   Vitals:   05/01/23 1927 05/01/23 2006 05/02/23 0403 05/02/23 0917  BP: (!) 153/73 (!) 173/77 (!) 156/82 (!) 155/72  Pulse: 82 87 90   Resp: 20 16 18    Temp: 99.8 F (37.7 C) 98.8 F (37.1 C) (!) 100.4 F (38 C)   TempSrc: Oral Oral Oral   SpO2: (!) 89% 90% (!) 86%   Weight:      Height:        Intake/Output Summary (Last 24 hours) at 05/02/2023 1020 Last data filed at 05/02/2023 0408 Gross per 24 hour  Intake 120 ml  Output 1900 ml  Net -1780 ml      04/30/2023    6:13 PM 04/30/2023   12:34 PM 04/26/2023   12:46 PM  Last 3 Weights  Weight (lbs) 184 lb  1.4 oz 184 lb 1.4 oz 184 lb  Weight (kg) 83.5 kg 83.5 kg 83.462 kg     Body mass index is 29.71 kg/m.  General:  elderly male in NAD HEENT: normal Neck: no JVD Cardiac:  normal S1, S2; RRR; no murmur  Lungs:  crackles and rhonchi throughout Abd: soft, nontender, no hepatomegaly  Ext: no edema Musculoskeletal:  No deformities, BUE and BLE strength normal and equal Skin: warm and dry  Neuro:  CNs 2-12 intact, no focal abnormalities noted Psych:  Normal affect   EKG:  The EKG was personally reviewed and demonstrates:  SR with HR 83, PVCs / bigeminy, LBBB Telemetry:  Telemetry was personally reviewed and demonstrates:  sinus rhythm to sinus tachycardia with HR in the 80-100 with PVCs  Relevant CV Studies:  Echo 05/01/23:  1. Left ventricular ejection fraction, by estimation, is 40 to 45%. The  left ventricle has mildly decreased function. The left ventricle  demonstrates global hypokinesis. There is mild left ventricular  hypertrophy. Left ventricular diastolic parameters  are consistent with Grade I diastolic dysfunction (impaired relaxation).   2. Right ventricular systolic function is normal. The right ventricular  size is normal.   3. Left atrial size was moderately dilated.   4. The mitral valve is normal in structure. No evidence of mitral valve  regurgitation. No evidence of mitral stenosis.   5. The aortic valve is calcified. There is moderate calcification of the  aortic valve. There is mild thickening of the aortic valve. Aortic valve  regurgitation is trivial. Aortic valve sclerosis is present, with no  evidence of aortic valve stenosis.  Aortic valve mean gradient measures 7.0 mmHg. Aortic valve Vmax measures  1.78 m/s.   6. The inferior vena cava is normal in size with greater than  50%  respiratory variability, suggesting right atrial pressure of 3 mmHg.   Laboratory Data:  High Sensitivity Troponin:   Recent Labs  Lab 04/26/23 1323 04/26/23 1511  TROPONINIHS  67* 75*     Chemistry Recent Labs  Lab 04/30/23 1350 05/01/23 0515 05/02/23 0446  NA 138 140 141  K 3.1* 3.1* 2.9*  CL 101 105 100  CO2 26 24 26   GLUCOSE 110* 96 122*  BUN 24* 21 25*  CREATININE 1.52* 1.18 1.51*  CALCIUM 9.9 9.0 9.6  MG  --  1.7  --   GFRNONAA 44* 60* 44*  ANIONGAP 11 11 15     Recent Labs  Lab 04/26/23 1429 04/30/23 1350 05/01/23 0515  PROT 6.9 7.0 6.2*  ALBUMIN 3.5 3.4* 2.4*  AST 28 50* 61*  ALT 18 29 35  ALKPHOS 34* 52 49  BILITOT 0.6 0.4 0.5   Lipids No results for input(s): "CHOL", "TRIG", "HDL", "LABVLDL", "LDLCALC", "CHOLHDL" in the last 168 hours.  Hematology Recent Labs  Lab 04/26/23 1323 04/30/23 1350 05/01/23 0515  WBC 7.0 6.5 6.3  RBC 4.38 4.09* 3.90*  HGB 11.9* 10.9* 10.5*  HCT 36.8* 34.0* 34.1*  MCV 84.0 83.1 87.4  MCH 27.2 26.7 26.9  MCHC 32.3 32.1 30.8  RDW 13.1 13.2 13.4  PLT 246 270 232   Thyroid No results for input(s): "TSH", "FREET4" in the last 168 hours.  BNP Recent Labs  Lab 04/26/23 1323  BNP 430.4*    DDimer  Recent Labs  Lab 04/26/23 1323  DDIMER 4.18*     Radiology/Studies:  ECHOCARDIOGRAM COMPLETE Result Date: 05/01/2023    ECHOCARDIOGRAM REPORT   Patient Name:   AWS SHERE Date of Exam: 05/01/2023 Medical Rec #:  [id number]     Height:       66.0 in Accession #:    [id number]    Weight:       184.1 lb Date of Birth:  [birth year]     BSA:          1.931 m Patient Age:    89 years      BP:           174/80 mmHg Patient Gender: M             HR:           90 bpm. Exam Location:  Inpatient Procedure: 2D Echo, Color Doppler, Cardiac Doppler and Intracardiac            Opacification Agent (Both Spectral and Color Flow Doppler were            utilized during procedure). Indications:    CHF-Diastolic  History:        Patient has prior history of Echocardiogram examinations, most                 recent 01/28/2018. COPD; Risk Factors:Diabetes, Dyslipidemia and                 Former Smoker.  Sonographer:    Raeford Razor  RDCS Referring Phys: [phone] AMRIT ADHIKARI IMPRESSIONS  1. Left ventricular ejection fraction, by estimation, is 40 to 45%. The left ventricle has mildly decreased function. The left ventricle demonstrates global hypokinesis. There is mild left ventricular hypertrophy. Left ventricular diastolic parameters are consistent with Grade I diastolic dysfunction (impaired relaxation).  2. Right ventricular systolic function is normal. The right ventricular size is normal.  3. Left atrial size was moderately dilated.  4. The mitral valve  is normal in structure. No evidence of mitral valve regurgitation. No evidence of mitral stenosis.  5. The aortic valve is calcified. There is moderate calcification of the aortic valve. There is mild thickening of the aortic valve. Aortic valve regurgitation is trivial. Aortic valve sclerosis is present, with no evidence of aortic valve stenosis. Aortic valve mean gradient measures 7.0 mmHg. Aortic valve Vmax measures 1.78 m/s.  6. The inferior vena cava is normal in size with greater than 50% respiratory variability, suggesting right atrial pressure of 3 mmHg. FINDINGS  Left Ventricle: Left ventricular ejection fraction, by estimation, is 40 to 45%. The left ventricle has mildly decreased function. The left ventricle demonstrates global hypokinesis. Definity contrast agent was given IV to delineate the left ventricular  endocardial borders. The left ventricular internal cavity size was normal in size. There is mild left ventricular hypertrophy. Left ventricular diastolic parameters are consistent with Grade I diastolic dysfunction (impaired relaxation). Right Ventricle: The right ventricular size is normal. No increase in right ventricular wall thickness. Right ventricular systolic function is normal. Left Atrium: Left atrial size was moderately dilated. Right Atrium: Right atrial size was normal in size. Pericardium: There is no evidence of pericardial effusion. Mitral Valve: The mitral  valve is normal in structure. No evidence of mitral valve regurgitation. No evidence of mitral valve stenosis. Tricuspid Valve: The tricuspid valve is normal in structure. Tricuspid valve regurgitation is not demonstrated. No evidence of tricuspid stenosis. Aortic Valve: The aortic valve is calcified. There is moderate calcification of the aortic valve. There is mild thickening of the aortic valve. Aortic valve regurgitation is trivial. Aortic valve sclerosis is present, with no evidence of aortic valve stenosis. Aortic valve mean gradient measures 7.0 mmHg. Aortic valve peak gradient measures 12.7 mmHg. Aortic valve area, by VTI measures 1.78 cm. Pulmonic Valve: The pulmonic valve was normal in structure. Pulmonic valve regurgitation is not visualized. No evidence of pulmonic stenosis. Aorta: The aortic root is normal in size and structure. Venous: The inferior vena cava is normal in size with greater than 50% respiratory variability, suggesting right atrial pressure of 3 mmHg. IAS/Shunts: No atrial level shunt detected by color flow Doppler.  LEFT VENTRICLE PLAX 2D LVIDd:         5.23 cm      Diastology LVIDs:         3.60 cm      LV e' medial:   9.25 cm/s LV PW:         1.07 cm      LV E/e' medial: 8.8 LV IVS:        1.23 cm LVOT diam:     2.10 cm LV SV:         56 LV SV Index:   29 LVOT Area:     3.46 cm  LV Volumes (MOD) LV vol d, MOD A2C: 165.0 ml LV vol d, MOD A4C: 103.0 ml LV vol s, MOD A2C: 93.7 ml LV vol s, MOD A4C: 51.5 ml LV SV MOD A2C:     71.3 ml LV SV MOD A4C:     103.0 ml LV SV MOD BP:      65.6 ml RIGHT VENTRICLE RV Basal diam:  3.10 cm RV Mid diam:    1.80 cm RV S prime:     17.50 cm/s TAPSE (M-mode): 2.7 cm LEFT ATRIUM              Index        RIGHT ATRIUM  Index LA diam:        3.20 cm  1.66 cm/m   RA Area:     17.60 cm LA Vol (A2C):   134.0 ml 69.41 ml/m  RA Volume:   47.50 ml  24.60 ml/m LA Vol (A4C):   64.5 ml  33.41 ml/m LA Biplane Vol: 94.4 ml  48.90 ml/m  AORTIC VALVE AV  Area (Vmax):    1.82 cm AV Area (Vmean):   1.74 cm AV Area (VTI):     1.78 cm AV Vmax:           178.00 cm/s AV Vmean:          124.000 cm/s AV VTI:            0.313 m AV Peak Grad:      12.7 mmHg AV Mean Grad:      7.0 mmHg LVOT Vmax:         93.60 cm/s LVOT Vmean:        62.400 cm/s LVOT VTI:          0.161 m LVOT/AV VTI ratio: 0.51  AORTA Ao Root diam: 3.30 cm Ao Asc diam:  3.20 cm MITRAL VALVE MV Area (PHT): 6.02 cm     SHUNTS MV Decel Time: 126 msec     Systemic VTI:  0.16 m MV E velocity: 81.50 cm/s   Systemic Diam: 2.10 cm MV Villa velocity: 139.00 cm/s MV E/Villa ratio:  0.59 Donato Schultz MD Electronically signed by Donato Schultz MD Signature Date/Time: 05/01/2023/2:24:07 PM    Final    DG Chest Port 1 View Result Date: 04/30/2023 : Questionable sepsis - evaluate for abnormality EXAM: PORTABLE CHEST 1 VIEW COMPARISON:  Chest x-ray 04/26/2023, CT chest 04/26/2023 FINDINGS: The heart and mediastinal contours are unchanged. Persistent patchy airspace opacities. Slightly increased perihilar interstitial markings. At least trace bilateral pleural effusions pleural effusion. No pneumothorax. No acute osseous abnormality. IMPRESSION: 1. Persistent patchy airspace opacities with at least trace bilateral pleural effusions. 2. Possible developing mild pulmonary edema. 3. Followup PA and lateral chest X-ray is recommended in 3-4 weeks following therapy to ensure resolution. Electronically Signed   By: Tish Frederickson M.D.   On: 04/30/2023 14:02     Assessment and Plan:   Acute systolic heart failure - newly recognized - mildly reduced LVEF 40-45% with grade 1 DD, normal RV - BNP on 04/26/23 was elevated at 430 - has been on hydralazine and chlorthalidone for BP - CXR concerning for pulmonary edema - reassuring stress test 2021 - question if uncontrolled HTN contributing vs PVCs - has received 40 mg IV lasix x 2 doses with 1.9 L urine output - chlorthalidone continued - he feels much improved after IV lasix and IV  ABX - medication titration may be difficult, unclear if he would be Villa candidate for entresto - renal function is fluctuating - he remains very active at home without significant problems with UTIs - consider SGLT2i - will try to add imdur vs changing hydralazine to bidil for HTN and HFmrEF - hold chlorthalidone for now   Hypertension - multiple medication intolerances - has been on 25 mg chlorthalidone and 50 mg hydralazine ?TID- he reports good compliance, but also reports SBP is typically in the 160-180 range at home - no cardizem, verapamil, or BB due to AV block - BP in the 150s today - consider long acting nitrate for CHF: imdur vs bidil - he reports compliance with TID medications - if renal  function improves, consider entresto   PVCs Some episodes of ventricular bigeminy - may have been related to low K and Mg - telemetry with PVCs, did not calculate burden - Mg 1.7 --> pending today, receiving replacement - K 2.9, replaced by primary - question PVC- mediated cardiomyopathy vs PVCs related to electrolyte disturbances   Second degree type 1 HB - no AV nodal agents - telemetry with SR and PVCs   PNA COPD - multifocal pneumonia - recently treated with augmentin and doxycycline outpatient - persistent symptoms now with concern for CHF - IV ABX per primary    Risk Assessment/Risk Scores:      New York Heart Association (NYHA) Functional Class NYHA Class IV        For questions or updates, please contact [facility] Please consult [email or web address] for contact info under    Signed, Marcelino Duster, PA  05/02/2023 10:20 AM

## 2023-05-03 ENCOUNTER — Encounter (HOSPITAL_COMMUNITY): Payer: Self-pay | Admitting: Internal Medicine

## 2023-05-03 ENCOUNTER — Encounter (HOSPITAL_COMMUNITY): Admission: EM | Disposition: E | Payer: Self-pay | Source: Home / Self Care | Attending: Internal Medicine

## 2023-05-03 ENCOUNTER — Inpatient Hospital Stay (HOSPITAL_COMMUNITY)

## 2023-05-03 DIAGNOSIS — R9389 Abnormal findings on diagnostic imaging of other specified body structures: Secondary | ICD-10-CM | POA: Diagnosis not present

## 2023-05-03 DIAGNOSIS — J189 Pneumonia, unspecified organism: Secondary | ICD-10-CM | POA: Diagnosis not present

## 2023-05-03 DIAGNOSIS — J9601 Acute respiratory failure with hypoxia: Secondary | ICD-10-CM | POA: Diagnosis not present

## 2023-05-03 LAB — BRAIN NATRIURETIC PEPTIDE: B Natriuretic Peptide: 157.6 pg/mL — ABNORMAL HIGH (ref 0.0–100.0)

## 2023-05-03 LAB — CBC
HCT: 39.4 % (ref 39.0–52.0)
Hemoglobin: 11.9 g/dL — ABNORMAL LOW (ref 13.0–17.0)
MCH: 26.2 pg (ref 26.0–34.0)
MCHC: 30.2 g/dL (ref 30.0–36.0)
MCV: 86.8 fL (ref 80.0–100.0)
Platelets: 293 10*3/uL (ref 150–400)
RBC: 4.54 MIL/uL (ref 4.22–5.81)
RDW: 13.6 % (ref 11.5–15.5)
WBC: 6.8 10*3/uL (ref 4.0–10.5)
nRBC: 0 % (ref 0.0–0.2)

## 2023-05-03 LAB — BASIC METABOLIC PANEL WITH GFR
Anion gap: 11 (ref 5–15)
BUN: 28 mg/dL — ABNORMAL HIGH (ref 8–23)
CO2: 25 mmol/L (ref 22–32)
Calcium: 9.3 mg/dL (ref 8.9–10.3)
Chloride: 101 mmol/L (ref 98–111)
Creatinine, Ser: 1.38 mg/dL — ABNORMAL HIGH (ref 0.61–1.24)
GFR, Estimated: 49 mL/min — ABNORMAL LOW (ref >60)
Glucose, Bld: 125 mg/dL — ABNORMAL HIGH (ref 70–99)
Potassium: 3.3 mmol/L — ABNORMAL LOW (ref 3.5–5.1)
Sodium: 137 mmol/L (ref 135–145)

## 2023-05-03 LAB — RESPIRATORY PANEL BY PCR

## 2023-05-03 LAB — MAGNESIUM: Magnesium: 2.3 mg/dL (ref 1.7–2.4)

## 2023-05-03 LAB — SEDIMENTATION RATE: Sed Rate: 83 mm/h — ABNORMAL HIGH (ref 0–16)

## 2023-05-03 LAB — MRSA NEXT GEN BY PCR, NASAL: MRSA by PCR Next Gen: NOT DETECTED

## 2023-05-03 LAB — C-REACTIVE PROTEIN: CRP: 18.4 mg/dL — ABNORMAL HIGH (ref <1.0)

## 2023-05-03 LAB — STREP PNEUMONIAE URINARY ANTIGEN: Strep Pneumo Urinary Antigen: NEGATIVE

## 2023-05-03 SURGERY — RIGHT/LEFT HEART CATH AND CORONARY ANGIOGRAPHY
Anesthesia: LOCAL

## 2023-05-03 MED ORDER — IOHEXOL 9 MG/ML PO SOLN
ORAL | Status: AC
  Filled 2023-05-03: qty 1000

## 2023-05-03 MED ORDER — DOXYCYCLINE HYCLATE 100 MG PO TABS
100.0000 mg | ORAL_TABLET | Freq: Two times a day (BID) | ORAL | Status: DC
  Administered 2023-05-03 – 2023-05-07 (×8): 100 mg via ORAL
  Filled 2023-05-03 (×8): qty 1

## 2023-05-03 MED ORDER — POTASSIUM CHLORIDE 10 MEQ/100ML IV SOLN
10.0000 meq | INTRAVENOUS | Status: AC
Start: 2023-05-03 — End: 2023-05-04
  Administered 2023-05-03 (×4): 10 meq via INTRAVENOUS
  Filled 2023-05-03 (×4): qty 100

## 2023-05-03 MED ORDER — POTASSIUM CHLORIDE CRYS ER 20 MEQ PO TBCR
40.0000 meq | EXTENDED_RELEASE_TABLET | Freq: Two times a day (BID) | ORAL | Status: DC
  Administered 2023-05-03 – 2023-05-06 (×6): 40 meq via ORAL
  Filled 2023-05-03 (×6): qty 2

## 2023-05-03 MED ORDER — ASPIRIN 81 MG PO CHEW
81.0000 mg | CHEWABLE_TABLET | ORAL | Status: AC
  Administered 2023-05-03: 81 mg via ORAL
  Filled 2023-05-03: qty 1

## 2023-05-03 MED ORDER — EPLERENONE 25 MG PO TABS
25.0000 mg | ORAL_TABLET | Freq: Every day | ORAL | Status: DC
Start: 2023-05-03 — End: 2023-05-04
  Administered 2023-05-03 – 2023-05-04 (×2): 25 mg via ORAL
  Filled 2023-05-03 (×2): qty 1

## 2023-05-03 MED ORDER — NON FORMULARY: 25.0000 mg | Freq: Every day | Status: DC

## 2023-05-03 MED ORDER — VANCOMYCIN HCL IN DEXTROSE 1-5 GM/200ML-% IV SOLN: 1000.0000 mg | INTRAVENOUS | Status: DC

## 2023-05-03 MED ORDER — ASPIRIN 325 MG PO TABS
325.0000 mg | ORAL_TABLET | Freq: Every day | ORAL | Status: DC
  Administered 2023-05-04 – 2023-05-05 (×2): 325 mg via ORAL
  Filled 2023-05-03 (×2): qty 1

## 2023-05-03 MED ORDER — SODIUM CHLORIDE 0.9 % IV SOLN: INTRAVENOUS | Status: AC

## 2023-05-03 MED ORDER — SODIUM CHLORIDE 0.9 % IV SOLN
INTRAVENOUS | Status: DC
Start: 2023-05-04 — End: 2023-05-03

## 2023-05-03 MED ORDER — FUROSEMIDE 10 MG/ML IJ SOLN
60.0000 mg | Freq: Once | INTRAMUSCULAR | Status: AC
  Administered 2023-05-03: 60 mg via INTRAVENOUS
  Filled 2023-05-03: qty 6

## 2023-05-03 MED ORDER — VANCOMYCIN HCL 1500 MG/300ML IV SOLN
1500.0000 mg | Freq: Once | INTRAVENOUS | Status: AC
  Administered 2023-05-03: 1500 mg via INTRAVENOUS
  Filled 2023-05-03: qty 300

## 2023-05-03 MED ORDER — PIPERACILLIN-TAZOBACTAM 3.375 G IVPB
3.3750 g | Freq: Three times a day (TID) | INTRAVENOUS | Status: DC
  Administered 2023-05-03 – 2023-05-08 (×16): 3.375 g via INTRAVENOUS
  Filled 2023-05-03 (×16): qty 50

## 2023-05-03 MED ORDER — METHYLPREDNISOLONE SODIUM SUCC 125 MG IJ SOLR
125.0000 mg | Freq: Every day | INTRAMUSCULAR | Status: DC
  Administered 2023-05-03 – 2023-05-05 (×3): 125 mg via INTRAVENOUS
  Filled 2023-05-03 (×3): qty 2

## 2023-05-03 MED ORDER — IPRATROPIUM-ALBUTEROL 0.5-2.5 (3) MG/3ML IN SOLN
3.0000 mL | Freq: Three times a day (TID) | RESPIRATORY_TRACT | Status: DC
  Administered 2023-05-03 – 2023-05-10 (×24): 3 mL via RESPIRATORY_TRACT
  Filled 2023-05-03 (×25): qty 3

## 2023-05-03 MED ORDER — ASPIRIN 81 MG PO CHEW: 81.0000 mg | CHEWABLE_TABLET | ORAL | Status: DC

## 2023-05-03 NOTE — Progress Notes (Signed)
 Patient is scheduled for Cardiac Cath today. Cardiology updated Patient is on 7l high flow oxygen with oxygen saturations of 90%. New orders placed for pxcr and cath lab made aware

## 2023-05-03 NOTE — Progress Notes (Addendum)
 Rounding Note    Patient Name: Patrick Villa Date of Encounter: 05/03/2023  [facility] Cardiologist: Jodelle Red, MD   Subjective   He is now on 8L HFNC, does not appear to have increased WOB, exam not extremely volume up. Holding [facility] for now, stat CXR pending  Inpatient Medications    Scheduled Meds:  [START ON 05/04/2023] aspirin  325 mg Oral Daily   chlorthalidone  25 mg Oral Daily   cholecalciferol  2,000 Units Oral Daily   doxazosin  2 mg Oral Q12H   enoxaparin (LOVENOX) injection  40 mg Subcutaneous Q24H   finasteride  5 mg Oral Daily   hydrALAZINE  50 mg Oral TID   ipratropium-albuterol  3 mL Nebulization TID   Continuous Infusions:  sodium chloride 10 mL/hr at 05/03/23 0521   azithromycin 500 mg (05/02/23 0923)   cefTRIAXone (ROCEPHIN)  IV 2 g (05/02/23 1041)   PRN Meds: acetaminophen, diclofenac, labetalol, mouth rinse   Vital Signs    Vitals:   05/02/23 1644 05/02/23 1942 05/02/23 2156 05/03/23 0353  BP:  (!) 141/67 (!) 154/76 (!) 148/68  Pulse:  85 90 90  Resp:  20 18 19   Temp:  98.9 F (37.2 C) 98 F (36.7 C) 99.6 F (37.6 C)  TempSrc:  Oral  Oral  SpO2: 93% 96% 93% 90%  Weight:      Height:        Intake/Output Summary (Last 24 hours) at 05/03/2023 0809 Last data filed at 05/03/2023 0500 Gross per 24 hour  Intake 490 ml  Output 650 ml  Net -160 ml      04/30/2023    6:13 PM 04/30/2023   12:34 PM 04/26/2023   12:46 PM  Last 3 Weights  Weight (lbs) 184 lb 1.4 oz 184 lb 1.4 oz 184 lb  Weight (kg) 83.5 kg 83.5 kg 83.462 kg      Telemetry    SR with HR 80-90s, PVCs - Personally Reviewed  ECG    No new tracings - Personally Reviewed  Physical Exam   GEN: No acute distress.   Neck: mild JVD Cardiac: RRR, no murmurs, rubs, or gallops.  Respiratory: crackles in bases GI: Soft, nontender, non-distended  MS: No edema; No deformity. Neuro:  Nonfocal  Psych: Normal affect   Labs    High Sensitivity Troponin:    Recent Labs  Lab 04/26/23 1323 04/26/23 1511  TROPONINIHS 67* 75*     Chemistry Recent Labs  Lab 04/26/23 1429 04/30/23 1350 05/01/23 0515 05/02/23 0446 05/03/23 0525  NA 137 138 140 141 137  K 3.3* 3.1* 3.1* 2.9* 3.3*  CL 100 101 105 100 101  CO2 28 26 24 26 25   GLUCOSE 97 110* 96 122* 125*  BUN 22 24* 21 25* 28*  CREATININE 1.24 1.52* 1.18 1.51* 1.38*  CALCIUM 9.5 9.9 9.0 9.6 9.3  MG  --   --  1.7  --  2.3  PROT 6.9 7.0 6.2*  --   --   ALBUMIN 3.5 3.4* 2.4*  --   --   AST 28 50* 61*  --   --   ALT 18 29 35  --   --   ALKPHOS 34* 52 49  --   --   BILITOT 0.6 0.4 0.5  --   --   GFRNONAA 56* 44* 60* 44* 49*  ANIONGAP 9 11 11 15 11     Lipids No results for input(s): "CHOL", "TRIG", "HDL", "LABVLDL", "  LDLCALC", "CHOLHDL" in the last 168 hours.  Hematology Recent Labs  Lab 04/30/23 1350 05/01/23 0515 05/03/23 0525  WBC 6.5 6.3 6.8  RBC 4.09* 3.90* 4.54  HGB 10.9* 10.5* 11.9*  HCT 34.0* 34.1* 39.4  MCV 83.1 87.4 86.8  MCH 26.7 26.9 26.2  MCHC 32.1 30.8 30.2  RDW 13.2 13.4 13.6  PLT 270 232 293   Thyroid No results for input(s): "TSH", "FREET4" in the last 168 hours.  BNP Recent Labs  Lab 04/26/23 1323  BNP 430.4*    DDimer  Recent Labs  Lab 04/26/23 1323  DDIMER 4.18*     Radiology    DG CHEST PORT 1 VIEW Result Date: 05/02/2023 CLINICAL DATA:  Shortness of breath. EXAM: PORTABLE CHEST 1 VIEW COMPARISON:  April 30, 2023 FINDINGS: The heart size and mediastinal contours are within normal limits. Mild bilateral ill-defined infiltrates are seen. Mild areas of atelectasis and/or infiltrate is also noted within the bilateral lung bases, right greater than left. There are small bilateral pleural effusions. No pneumothorax is identified. The visualized skeletal structures are unremarkable. IMPRESSION: 1. Mild bilateral ill-defined infiltrates with mild bibasilar atelectasis and/or infiltrate. 2. Small bilateral pleural effusions. Electronically Signed   By:  Aram Candela M.D.   On: 05/02/2023 19:33   ECHOCARDIOGRAM COMPLETE Result Date: 05/01/2023    ECHOCARDIOGRAM REPORT   Patient Name:   Patrick Villa Date of Exam: 05/01/2023 Medical Rec #:  [id number]     Height:       66.0 in Accession #:    [id number]    Weight:       184.1 lb Date of Birth:  October 02, 1933     BSA:          1.931 m Patient Age:    89 years      BP:           174/80 mmHg Patient Gender: M             HR:           90 bpm. Exam Location:  Inpatient Procedure: 2D Echo, Color Doppler, Cardiac Doppler and Intracardiac            Opacification Agent (Both Spectral and Color Flow Doppler were            utilized during procedure). Indications:    CHF-Diastolic  History:        Patient has prior history of Echocardiogram examinations, most                 recent 01/28/2018. COPD; Risk Factors:Diabetes, Dyslipidemia and                 Former Smoker.  Sonographer:    Raeford Razor RDCS Referring Phys: [phone] AMRIT ADHIKARI IMPRESSIONS  1. Left ventricular ejection fraction, by estimation, is 40 to 45%. The left ventricle has mildly decreased function. The left ventricle demonstrates global hypokinesis. There is mild left ventricular hypertrophy. Left ventricular diastolic parameters are consistent with Grade I diastolic dysfunction (impaired relaxation).  2. Right ventricular systolic function is normal. The right ventricular size is normal.  3. Left atrial size was moderately dilated.  4. The mitral valve is normal in structure. No evidence of mitral valve regurgitation. No evidence of mitral stenosis.  5. The aortic valve is calcified. There is moderate calcification of the aortic valve. There is mild thickening of the aortic valve. Aortic valve regurgitation is trivial. Aortic valve sclerosis is present, with no evidence of  aortic valve stenosis. Aortic valve mean gradient measures 7.0 mmHg. Aortic valve Vmax measures 1.78 m/s.  6. The inferior vena cava is normal in size with greater than 50% respiratory  variability, suggesting right atrial pressure of 3 mmHg. FINDINGS  Left Ventricle: Left ventricular ejection fraction, by estimation, is 40 to 45%. The left ventricle has mildly decreased function. The left ventricle demonstrates global hypokinesis. Definity contrast agent was given IV to delineate the left ventricular  endocardial borders. The left ventricular internal cavity size was normal in size. There is mild left ventricular hypertrophy. Left ventricular diastolic parameters are consistent with Grade I diastolic dysfunction (impaired relaxation). Right Ventricle: The right ventricular size is normal. No increase in right ventricular wall thickness. Right ventricular systolic function is normal. Left Atrium: Left atrial size was moderately dilated. Right Atrium: Right atrial size was normal in size. Pericardium: There is no evidence of pericardial effusion. Mitral Valve: The mitral valve is normal in structure. No evidence of mitral valve regurgitation. No evidence of mitral valve stenosis. Tricuspid Valve: The tricuspid valve is normal in structure. Tricuspid valve regurgitation is not demonstrated. No evidence of tricuspid stenosis. Aortic Valve: The aortic valve is calcified. There is moderate calcification of the aortic valve. There is mild thickening of the aortic valve. Aortic valve regurgitation is trivial. Aortic valve sclerosis is present, with no evidence of aortic valve stenosis. Aortic valve mean gradient measures 7.0 mmHg. Aortic valve peak gradient measures 12.7 mmHg. Aortic valve area, by VTI measures 1.78 cm. Pulmonic Valve: The pulmonic valve was normal in structure. Pulmonic valve regurgitation is not visualized. No evidence of pulmonic stenosis. Aorta: The aortic root is normal in size and structure. Venous: The inferior vena cava is normal in size with greater than 50% respiratory variability, suggesting right atrial pressure of 3 mmHg. IAS/Shunts: No atrial level shunt detected by color  flow Doppler.  LEFT VENTRICLE PLAX 2D LVIDd:         5.23 cm      Diastology LVIDs:         3.60 cm      LV e' medial:   9.25 cm/s LV PW:         1.07 cm      LV E/e' medial: 8.8 LV IVS:        1.23 cm LVOT diam:     2.10 cm LV SV:         56 LV SV Index:   29 LVOT Area:     3.46 cm  LV Volumes (MOD) LV vol d, MOD A2C: 165.0 ml LV vol d, MOD A4C: 103.0 ml LV vol s, MOD A2C: 93.7 ml LV vol s, MOD A4C: 51.5 ml LV SV MOD A2C:     71.3 ml LV SV MOD A4C:     103.0 ml LV SV MOD BP:      65.6 ml RIGHT VENTRICLE RV Basal diam:  3.10 cm RV Mid diam:    1.80 cm RV S prime:     17.50 cm/s TAPSE (M-mode): 2.7 cm LEFT ATRIUM              Index        RIGHT ATRIUM           Index LA diam:        3.20 cm  1.66 cm/m   RA Area:     17.60 cm LA Vol (A2C):   134.0 ml 69.41 ml/m  RA Volume:   47.50 ml  24.60 ml/m LA Vol (A4C):   64.5 ml  33.41 ml/m LA Biplane Vol: 94.4 ml  48.90 ml/m  AORTIC VALVE AV Area (Vmax):    1.82 cm AV Area (Vmean):   1.74 cm AV Area (VTI):     1.78 cm AV Vmax:           178.00 cm/s AV Vmean:          124.000 cm/s AV VTI:            0.313 m AV Peak Grad:      12.7 mmHg AV Mean Grad:      7.0 mmHg LVOT Vmax:         93.60 cm/s LVOT Vmean:        62.400 cm/s LVOT VTI:          0.161 m LVOT/AV VTI ratio: 0.51  AORTA Ao Root diam: 3.30 cm Ao Asc diam:  3.20 cm MITRAL VALVE MV Area (PHT): 6.02 cm     SHUNTS MV Decel Time: 126 msec     Systemic VTI:  0.16 m MV E velocity: 81.50 cm/s   Systemic Diam: 2.10 cm MV A velocity: 139.00 cm/s MV E/A ratio:  0.59 Donato Schultz MD Electronically signed by Donato Schultz MD Signature Date/Time: 05/01/2023/2:24:07 PM    Final     Cardiac Studies   Echo 05/02/23:  1. Left ventricular ejection fraction, by estimation, is 40 to 45%. The  left ventricle has mildly decreased function. The left ventricle  demonstrates global hypokinesis. There is mild left ventricular  hypertrophy. Left ventricular diastolic parameters  are consistent with Grade I diastolic dysfunction  (impaired relaxation).   2. Right ventricular systolic function is normal. The right ventricular  size is normal.   3. Left atrial size was moderately dilated.   4. The mitral valve is normal in structure. No evidence of mitral valve  regurgitation. No evidence of mitral stenosis.   5. The aortic valve is calcified. There is moderate calcification of the  aortic valve. There is mild thickening of the aortic valve. Aortic valve  regurgitation is trivial. Aortic valve sclerosis is present, with no  evidence of aortic valve stenosis.  Aortic valve mean gradient measures 7.0 mmHg. Aortic valve Vmax measures  1.78 m/s.   6. The inferior vena cava is normal in size with greater than 50%  respiratory variability, suggesting right atrial pressure of 3 mmHg.   Patient Profile     89 y.o. male with a hx of second-degree AV block type I, hyperlipidemia, hypertension, and COPD who is being seen for the evaluation of congestive heart failure.  Assessment & Plan    Acute hypoxic respiratory failure Multifocal PNA - yesterday afternoon his O2 saturation dropped to the 80s prompting 7L NFNC - Temp 100.4 yesterday afternoon - he tolerated lowering the bed to nearly flat without increased SOB - lung fields sound mildly improved from yesterday - when I entered the room, vitals obtained and O2 was 80% on 7L which improved to 90% with nose breathing and 8L HFNC - ABG overnight with pH 7.52, pCO2 38, pO2 60, Bicarb 31 - will obtain repeat stat CXR - I have canceled [facility] to transport for heart cath for now, he remains on the schedule, can have clear liquids   Acute systolic heart failure - new diagnosis this admission - mildly reduced LVEF 40-45% with grade 1 DD, normal RV - BNP on 04/26/23 was elevated at 430 - has been on hydralazine  and chlorthalidone for BP - he was diuresed with 40 mg IV lasix x 2 doses with diuresis - also on IV ABX - as above, hypoxia seems out of proportion to his exam RE  heart failure - will hold off on additional IV lasix for now    Hypertension - now managed in the context of HFrEF - multiple medication intolerances - BP remains elevated - on 50 mg hydralazine TID and 25 mg chlorthalidone - add 30 mg imdur   PVCs - telemetry shows PVCs may be reduced - Mg now 2.3 - K 2.9 replaced --> 3.3   AKI - sCr improving to 1.38   Second degree type 1 HB - on no AV nodal agents - no high grade AV block seen on telemetry   ADDENDUM Appreciate PCCM input. Will officially cancel heart cath for today. He may have a diet from a cardiology perspective.     For questions or updates, please contact [facility] Please consult [email or web address] for contact info under        Signed, Marcelino Duster, PA  05/03/2023, 8:09 AM

## 2023-05-03 NOTE — Progress Notes (Signed)
 Pharmacy Antibiotic Note  Patrick Villa is a 89 y.o. male admitted on 04/30/2023 with CAP, recently treated PNA with Augmentin and doxycycline as an outpatient but continued to have symptoms.  Pharmacy is now consulted to escalate antibiotics from Azith/Ceftriaxone to Zosyn and Vancomycin dosing for HCAP.   Plan: Zosyn 3.375g IV Q8H infused over 4hrs. Vancomycin 1500 mg IV x1 then 1000 mg IV q24h (SCr 1.38, est AUC 510)  Measure Vanc levels as needed.  Goal AUC = 400 - 550 Follow up renal function, culture results, and clinical course.   Height: 5\' 6"  (167.6 cm) Weight: 83.5 kg (184 lb 1.4 oz) IBW/kg (Calculated) : 63.8  Temp (24hrs), Avg:99.2 F (37.3 C), Min:98 F (36.7 C), Max:100.4 F (38 C)  Recent Labs  Lab 04/26/23 1323 04/26/23 1429 04/30/23 1350 04/30/23 1537 05/01/23 0515 05/02/23 0446 05/03/23 0525  WBC 7.0  --  6.5  --  6.3  --  6.8  CREATININE  --  1.24 1.52*  --  1.18 1.51* 1.38*  LATICACIDVEN  --   --  1.5 1.0  --   --   --     Estimated Creatinine Clearance: 38.2 mL/min (A) (by C-G formula based on SCr of 1.38 mg/dL (H)).    Allergies  Allergen Reactions   Amlodipine Other (See Comments)    Patient developed jitteriness, tremors and unstable gait.  Same issues when re-challenged   Lipitor [Atorvastatin Calcium]     Weakness.   Zocor [Simvastatin]    Bactrim [Sulfamethoxazole-Trimethoprim] Rash   Pravachol Hives and Rash    Antimicrobials this admission: 4/5 Azithromycin >>4/9 4/5 Ceftriaxone >> 4/7 4/8 Zosyn >> 4/8 Vancomycin >>   Dose adjustments this admission:   Microbiology results: 4/5 Resp panel: neg covid, flu, rsv 4/5 BCx: ngtd 4/8 sputum: 4/8 MRSA PCR:   Thank you for allowing pharmacy to be a part of this patient's care.  Lynann Beaver PharmD, BCPS [facility] main pharmacy [phone] 05/03/2023 9:40 AM

## 2023-05-03 NOTE — Evaluation (Signed)
 Occupational Therapy Evaluation Patient Details Name: Patrick Villa MRN: [id number] DOB: 1933-02-27 Today's Date: 05/03/2023   History of Present Illness   89 yr old male admitted with PNA. PMH: COPD, DM, BPH     Clinical Impressions The pt is currently presenting well below his independent baseline level of functioning for self-care management. He is limited by the below listed deficits (see OT problem list). At current, he presents with increased need for supplemental O2 (currently using 8L O2 via high flow nasal cannula), shortness of breath with light activity, and decreased activity tolerance/quick fatigue. His O2 saturation decreased to 82% on 8L O2 seated edge of bed and his heart rate increased to 135 bpm. He had slow recovery to 90% or greater, despite implementing deep breathing exercises and increasing supplemental O2 to 9L. Given the aforementioned, attempts at out of bed activity were deferred. OT anticipates his overall ADL performance and functional abilities would improve significantly, pending improvement in symptoms. OT will continue to follow the pt for services in the acute care setting and make appropriate discharge recommendations as appropriate.      If plan is discharge home, recommend the following:   Assist for transportation;Assistance with cooking/housework;Help with stairs or ramp for entrance;A little help with walking and/or transfers;A lot of help with bathing/dressing/bathroom     Functional Status Assessment   Patient has had a recent decline in their functional status and demonstrates the ability to make significant improvements in function in a reasonable and predictable amount of time.     Equipment Recommendations   Other (comment) (to be determined, pending functional progress)     Recommendations for Other Services         Precautions/Restrictions   Precautions Precaution/Restrictions Comments: monitor O2 Restrictions Weight Bearing  Restrictions Per Provider Order: No     Mobility Bed Mobility Overal bed mobility: Needs Assistance Bed Mobility: Supine to Sit, Sit to Supine     Supine to sit: Supervision, HOB elevated, Used rails Sit to supine: Supervision, HOB elevated, Used rails        Transfers      General transfer comment: deferred, due to pt presenting with increased shortness of breath, elevated heart rate, and O2 desaturation after performing supine to sit      Balance       Sitting balance - Comments: static sitting-good. dynamic sitting-fair+         ADL either performed or assessed with clinical judgement   ADL Overall ADL's : Needs assistance/impaired Eating/Feeding: Independent;Bed level   Grooming: Supervision/safety;Set up;Bed level           Upper Body Dressing : Minimal assistance;Bed level   Lower Body Dressing: Minimal assistance;Sitting/lateral leans                       Vision   Additional Comments: He correctly read the time depicted on the wall clock            Pertinent Vitals/Pain Pain Assessment Pain Assessment: No/denies pain     Extremity/Trunk Assessment Upper Extremity Assessment Upper Extremity Assessment: Overall WFL for tasks assessed;Right hand dominant   Lower Extremity Assessment Lower Extremity Assessment: Overall WFL for tasks assessed       Communication Communication Communication: No apparent difficulties   Cognition Arousal: Alert   Cognition: No apparent impairments        Following commands: Intact  Home Living Family/patient expects to be discharged to:: Private residence Living Arrangements: Alone   Type of Home: House Home Access: Stairs to enter Secretary/administrator of Steps: 1   Home Layout: One level     Bathroom Shower/Tub: Tub/shower unit         Home Equipment: None   Additional Comments: he has a significant other who checks on him regularly.      Prior  Functioning/Environment Prior Level of Function : Independent/Modified Independent;Driving             Mobility Comments: Independent ADLs Comments: Independent with ADLs, driving, gardening, and cooking.    OT Problem List: Decreased strength;Decreased activity tolerance;Decreased knowledge of use of DME or AE;Cardiopulmonary status limiting activity   OT Treatment/Interventions: Self-care/ADL training;Therapeutic exercise;Energy conservation;DME and/or AE instruction;Patient/family education      OT Goals(Current goals can be found in the care plan section)   Acute Rehab OT Goals Patient Stated Goal: to return to his prior level of functioning OT Goal Formulation: With patient Time For Goal Achievement: 05/17/23 Potential to Achieve Goals: Good ADL Goals Pt Will Perform Grooming: with supervision;standing Pt Will Perform Lower Body Dressing: with supervision;sit to/from stand;sitting/lateral leans Pt Will Transfer to Toilet: with supervision;ambulating Pt Will Perform Toileting - Clothing Manipulation and hygiene: with supervision;sit to/from stand   OT Frequency:  Min 2X/week       AM-PAC OT "6 Clicks" Daily Activity     Outcome Measure Help from another person eating meals?: None Help from another person taking care of personal grooming?: A Little Help from another person toileting, which includes using toliet, bedpan, or urinal?: A Lot Help from another person bathing (including washing, rinsing, drying)?: A Lot Help from another person to put on and taking off regular upper body clothing?: A Little Help from another person to put on and taking off regular lower body clothing?: A Little 6 Click Score: 17   End of Session Equipment Utilized During Treatment: Oxygen Nurse Communication: Other (comment)  Activity Tolerance: Other (comment) (Limited elevated heart rate, shortness of breath, and decreased activity tolerance) Patient left: in bed;with call bell/phone  within reach;with bed alarm set;with family/visitor present  OT Visit Diagnosis: Muscle weakness (generalized) (M62.81)                Time: 0347-4259 OT Time Calculation (min): 29 min Charges:  OT General Charges $OT Visit: 1 Visit OT Evaluation $OT Eval Moderate Complexity: 1 Mod OT Treatments $Therapeutic Activity: 8-22 mins    Reuben Likes, OTR/L 05/03/2023, 5:31 PM

## 2023-05-03 NOTE — TOC Initial Note (Signed)
 Transition of Care [facility]) - Initial/Assessment Note    Patient Details  Name: Patrick Villa MRN: [id number] Date of Birth: [birth year]  Transition of Care [facility]) CM/SW Contact:    Lanier Clam, RN Phone Number: 05/03/2023, 3:03 PM  Clinical Narrative: d/c plan home. HHPT no preference [facility] On 02-monitor.                  Expected Discharge Plan: Home w Home Health Services Barriers to Discharge: Continued Medical Work up   Patient Goals and CMS Choice Patient states their goals for this hospitalization and ongoing recovery are:: Home CMS Medicare.gov Compare Post Acute Care list provided to:: Patient Choice offered to / list presented to : Patient [place] ownership interest in [facility].provided to:: Patient    Expected Discharge Plan and Services   Discharge Planning Services: CM Consult Post Acute Care Choice: Home Health Living arrangements for the past 2 months: Single Family Home                                      Prior Living Arrangements/Services Living arrangements for the past 2 months: Single Family Home Lives with:: Friends Patient language and need for interpreter reviewed:: Yes Do you feel safe going back to the place where you live?: Yes      Need for Family Participation in Patient Care: Yes (Comment) Care giver support system in place?: Yes (comment) Current home services:  (n/a) Criminal Activity/Legal Involvement Pertinent to Current Situation/Hospitalization: No - Comment as needed  Activities of Daily Living   ADL Screening (condition at time of admission) Independently performs ADLs?: Yes (appropriate for developmental age) Is the patient deaf or have difficulty hearing?: No Does the patient have difficulty seeing, even when wearing glasses/contacts?: No Does the patient have difficulty concentrating, remembering, or making decisions?: No  Permission Sought/Granted Permission sought to share  information with : Case Manager Permission granted to share information with : Yes, Verbal Permission Granted  Share Information with NAME: Case Manager     Permission granted to share info w Relationship: Roney Marion (dtr) [phone]     Emotional Assessment Appearance:: Appears stated age Attitude/Demeanor/Rapport: Gracious Affect (typically observed): Accepting Orientation: : Oriented to Self, Oriented to Place, Oriented to  Time, Oriented to Situation Alcohol / Substance Use: Not Applicable Psych Involvement: No (comment)  Admission diagnosis:  Hypoxia [R09.02] Pneumonia of both lungs due to infectious organism [J18.9] Pneumonia due to infectious organism, unspecified laterality, unspecified part of lung [J18.9] Patient Active Problem List   Diagnosis Date Noted   Hypoxia 05/02/2023   Acute systolic heart failure (HCC) 05/02/2023   Aortic atherosclerosis (HCC) 05/02/2023   Coronary artery calcification 05/02/2023   Multifocal pneumonia 04/30/2023   Hypokalemia 04/30/2023   AKI (acute kidney injury) (HCC) 04/30/2023   Pure hypercholesterolemia 10/27/2020   Prediabetes 10/27/2020   Bilateral foot pain 04/17/2020   ED (erectile dysfunction) 03/27/2019   BPH (benign prostatic hyperplasia) 05/23/2018   Second degree AV block, Mobitz type I 02/20/2018   Vitamin D deficiency 07/17/2014   Essential hypertension    COPD (chronic obstructive pulmonary disease) (HCC)    Hyperlipidemia    Colon polyps    Systolic murmur    Ganglion cyst    PCP:  Ollen Bowl, MD Pharmacy:   [facility] [phone] - [place], [place] - [address]  AT [facility] [address] [place] [address] Phone: [phone] Fax: [phone]  [facility]) - [place], [place] - [address] [facility] [address] [place] [address] [place] [address] Phone: [phone] Fax: [phone]  [facility] - [place], [place] - [address] [place] [address] Phone: [phone] Fax: [phone]  [facility] [address] [place] [address] Phone: [phone] Fax: [phone]  [id number] [place], [place] - [address] [facility] [address] [address] [place] [address] Phone: [phone] Fax: [phone]     Social Drivers of Health (SDOH) Social History: SDOH Screenings   Food Insecurity: No Food Insecurity (05/01/2023)  Housing: Low Risk  (05/01/2023)  Transportation Needs: No Transportation Needs (05/01/2023)  Utilities: Not At Risk (05/01/2023)  Alcohol Screen: Low Risk  (04/30/2020)  Depression (PHQ2-9): Low Risk  (06/26/2020)  Financial Resource Strain: Low Risk  (08/23/2019)  Social Connections: Moderately Integrated (04/30/2023)  Tobacco Use: Medium Risk (05/03/2023)   SDOH Interventions: Food Insecurity Interventions: Intervention Not Indicated, Inpatient TOC Housing Interventions: Intervention Not Indicated, Inpatient TOC Transportation Interventions: Intervention Not Indicated, Inpatient TOC Utilities Interventions: Intervention Not Indicated, Inpatient TOC   Readmission Risk Interventions     No data to display

## 2023-05-03 NOTE — Progress Notes (Signed)
 CT reviewed> worse bilateral GGO, more areas involved. No effusion. BNP & CRP elevated> worry about post-infectious inflammatory process, although this would be earlier than expected for BOOP.  Per chart review- still on ~8L O2.  -check RVP -lasix+ additional vit K -solumedrol 125mg  daily added for possible inflammatory cause -con't broadened antibiotics  Steffanie Dunn, DO 05/03/23 3:04 PM [facility]  For contact information, see Amion. If no response to pager, please call PCCM consult pager. After hours, 7PM- 7AM, please call Elink.

## 2023-05-03 NOTE — Progress Notes (Signed)
 PROGRESS NOTE  Patrick Villa  ZDG:[id number] DOB: 04/27/1933 DOA: 04/30/2023 PCP: Ollen Bowl, MD   Brief Narrative: Patient is a 89 year old male with history of COPD, diabetes mellitus, hyperlipidemia, hypertension, BPH who presented to the device department with complaint of generalized weakness, shortness of breath, nonproductive cough, fever.  On presentation,he was hypoxic at 87%.  She was recently treated for multifocal pneumonia with Augmentin and doxycycline as an outpatient but continued to have symptoms.  On presentation, he was febrile up to one 101 .65F, saturating 89% on room air, potassium 3.1, creatinine 1.5.  Chest x-ray showed patchy airspace opacity with bilateral pleural effusion, possible mild pulmonary edema.  Patient admitted for further management of multifocal pneumonia not responding to outpatient antibiotic therapy.  Also suspected to have volume overload, started on IV Lasix.  Echo showed EF of 40 to 45%, cardiology also consulted.  Hospital course remarkable for worsening respiratory status, no 9 L of oxygen.  PCCM also consulted.  Assessment & Plan:  Principal Problem:   Multifocal pneumonia Active Problems:   Essential hypertension   COPD (chronic obstructive pulmonary disease) (HCC)   Hyperlipidemia   BPH (benign prostatic hyperplasia)   Hypokalemia   AKI (acute kidney injury) (HCC)   Hypoxia   Acute systolic heart failure (HCC)   Aortic atherosclerosis (HCC)   Coronary artery calcification   Multifocal pneumonia/community-acquired pneumonia:He was recently treated for multifocal pneumonia with Augmentin and doxycycline as an outpatient but continued to have symptoms.  Chest x-ray showed patchy airspace opacity with suspicion of bilateral pleural effusion, possible mild pulmonary edema.  Started on broad-spectrum antibiotics.  Lactic acid was normal on presentation.  Follow-up cultures:NGTD.  No leukocytosis or fever this morning.  Chest x-ray follow-up on  4/8  showed right sided pneumonia.  PCCM consulted due to worsening respiratory status.  Plan for doing CT chest.  Antibiotics changed to vancomycin and Zosyn.  Will also continue doxycycline because of history of recent tick bites.  It is uncommon to develop multifocal pneumonia from tick bite.  Pending streptococcal pneumoniae antigen, sputum culture  Acute HFrEF: Does not have peripheral edema but had bilateral crackles.  Chest imaging suggestive of features of pulmonary edema.  Elevated BNP as per 4/1.  Given  2 doses of Lasix IV 40 mg on 4/6.  Echo showed EF of 40 to 45%, global hypokinesis, grade 1 diastolic dysfunction.  Cardiology consulted, plan for a right/left heart cath when respiratory status is stable.  Lasix on hold because creatinine trended up  Acute hypoxic respiratory/COPD: Not on on oxygen at home.  Currently on 8-9 of oxygen.  Continue to wean and monitor.  He is a past smoker  AKI: Creatinine trended up to 1.5.  Baseline creatinine normal.  Will continue to hold Lasix for now.  Creatinine trending down.  Hypertension: Currently hypertensive.  Continue to  monitor blood pressure.  Continue home medications: Chlorthalidone, hydralazine.Continue PRN medication for severe hypertension.  Started on eplerenone.  Also on doxycycline  VZD:GLOVFIEP terazosin,proscar  Debility/deconditioning: Patient lives alone.  Ambulates well.  He daughter lives just a few minutes away.  Will consult PT/OT          DVT prophylaxis:enoxaparin (LOVENOX) injection 40 mg Start: 04/30/23 2200     Code Status: Full Code  Family Communication:  discussed with daughter Eber Jones at bedside on 4/8  Patient status:Inpatient  Patient is from :Home  Anticipated discharge PI:RJJO  Estimated DC date: 2 to 3 days   Consultants: Cardiology, PCCM  Procedures:None  Antimicrobials:  Anti-infectives (From admission, onward)    Start     Dose/Rate Route Frequency Ordered Stop   05/04/23 1200   vancomycin (VANCOCIN) IVPB 1000 mg/200 mL premix        1,000 mg 200 mL/hr over 60 Minutes Intravenous Every 24 hours 05/03/23 0953     05/03/23 1130  doxycycline (VIBRA-TABS) tablet 100 mg        100 mg Oral Every 12 hours 05/03/23 1043     05/03/23 1030  piperacillin-tazobactam (ZOSYN) IVPB 3.375 g        3.375 g 12.5 mL/hr over 240 Minutes Intravenous Every 8 hours 05/03/23 0943     05/03/23 1030  vancomycin (VANCOREADY) IVPB 1500 mg/300 mL        1,500 mg 150 mL/hr over 120 Minutes Intravenous  Once 05/03/23 0943 05/03/23 1249   05/01/23 1000  cefTRIAXone (ROCEPHIN) 2 g in sodium chloride 0.9 % 100 mL IVPB  Status:  Discontinued        2 g 200 mL/hr over 30 Minutes Intravenous Every 24 hours 04/30/23 1844 05/03/23 0940   05/01/23 1000  azithromycin (ZITHROMAX) 500 mg in sodium chloride 0.9 % 250 mL IVPB  Status:  Discontinued        500 mg 250 mL/hr over 60 Minutes Intravenous Every 24 hours 04/30/23 1844 05/03/23 1043   04/30/23 1345  cefTRIAXone (ROCEPHIN) 2 g in sodium chloride 0.9 % 100 mL IVPB        2 g 200 mL/hr over 30 Minutes Intravenous Once 04/30/23 1332 04/30/23 1455   04/30/23 1345  azithromycin (ZITHROMAX) 500 mg in sodium chloride 0.9 % 250 mL IVPB        500 mg 250 mL/hr over 60 Minutes Intravenous  Once 04/30/23 1332 04/30/23 1534       Subjective: Patient seen and examined at the bedside today.  Hemodynamically stable but requiring more oxygen.  This morning he was on 8-9 L of high flow oxygen.  Complains of some shortness of breath but does not look in any severe respiratory distress.  Not much of cough.  Daughter at bedside.  Denied any chest pain.  Objective: Vitals:   05/03/23 0806 05/03/23 0833 05/03/23 1013 05/03/23 1244  BP: (!) 158/78  (!) 154/72 (!) 156/88  Pulse: 92   (!) 109  Resp:    20  Temp:    98.8 F (37.1 C)  TempSrc:    Oral  SpO2: 94% 90%  91%  Weight:      Height:        Intake/Output Summary (Last 24 hours) at 05/03/2023 1342 Last  data filed at 05/03/2023 1200 Gross per 24 hour  Intake 250 ml  Output 1050 ml  Net -800 ml   Filed Weights   04/30/23 1234 04/30/23 1813  Weight: 83.5 kg 83.5 kg    Examination:   General exam: Overall comfortable, not in distress, pleasant elderly male HEENT: PERRL Respiratory system: Rhonchi, bilateral crackles Cardiovascular system: S1 & S2 heard, RRR.  Gastrointestinal system: Abdomen is nondistended, soft and nontender. Central nervous system: Alert and oriented Extremities: No edema, no clubbing ,no cyanosis Skin: No rashes, no ulcers,no icterus    Data Reviewed: I have personally reviewed following labs and imaging studies  CBC: Recent Labs  Lab 04/30/23 1350 05/01/23 0515 05/03/23 0525  WBC 6.5 6.3 6.8  NEUTROABS 5.2 4.6  --   HGB 10.9* 10.5* 11.9*  HCT 34.0* 34.1* 39.4  MCV 83.1 87.4  86.8  PLT 270 232 293   Basic Metabolic Panel: Recent Labs  Lab 04/26/23 1429 04/30/23 1350 05/01/23 0515 05/02/23 0446 05/03/23 0525  NA 137 138 140 141 137  K 3.3* 3.1* 3.1* 2.9* 3.3*  CL 100 101 105 100 101  CO2 28 26 24 26 25   GLUCOSE 97 110* 96 122* 125*  BUN 22 24* 21 25* 28*  CREATININE 1.24 1.52* 1.18 1.51* 1.38*  CALCIUM 9.5 9.9 9.0 9.6 9.3  MG  --   --  1.7  --  2.3     Recent Results (from the past 240 hours)  Resp panel by RT-PCR (RSV, Flu A&B, Covid) Anterior Nasal Swab     Status: None   Collection Time: 04/26/23  1:23 PM   Specimen: Anterior Nasal Swab  Result Value Ref Range Status   SARS Coronavirus 2 by RT PCR NEGATIVE NEGATIVE Final    Comment: (NOTE) SARS-CoV-2 target nucleic acids are NOT DETECTED.  The SARS-CoV-2 RNA is generally detectable in upper respiratory specimens during the acute phase of infection. The lowest concentration of SARS-CoV-2 viral copies this assay can detect is 138 copies/mL. A negative result does not preclude SARS-Cov-2 infection and should not be used as the sole basis for treatment or other patient management  decisions. A negative result may occur with  improper specimen collection/handling, submission of specimen other than nasopharyngeal swab, presence of viral mutation(s) within the areas targeted by this assay, and inadequate number of viral copies(<138 copies/mL). A negative result must be combined with clinical observations, patient history, and epidemiological information. The expected result is Negative.  Fact Sheet for Patients:  BloggerCourse.com  Fact Sheet for Healthcare Providers:  SeriousBroker.it  This test is no t yet approved or cleared by the [place] FDA and  has been authorized for detection and/or diagnosis of SARS-CoV-2 by FDA under an Emergency Use Authorization (EUA). This EUA will remain  in effect (meaning this test can be used) for the duration of the COVID-19 declaration under Section 564(b)(1) of the Act, 21 U.S.C.section 360bbb-3(b)(1), unless the authorization is terminated  or revoked sooner.       Influenza A by PCR NEGATIVE NEGATIVE Final   Influenza B by PCR NEGATIVE NEGATIVE Final    Comment: (NOTE) The Xpert Xpress SARS-CoV-2/FLU/RSV plus assay is intended as an aid in the diagnosis of influenza from Nasopharyngeal swab specimens and should not be used as a sole basis for treatment. Nasal washings and aspirates are unacceptable for Xpert Xpress SARS-CoV-2/FLU/RSV testing.  Fact Sheet for Patients: BloggerCourse.com  Fact Sheet for Healthcare Providers: SeriousBroker.it  This test is not yet approved or cleared by the [place] FDA and has been authorized for detection and/or diagnosis of SARS-CoV-2 by FDA under an Emergency Use Authorization (EUA). This EUA will remain in effect (meaning this test can be used) for the duration of the COVID-19 declaration under Section 564(b)(1) of the Act, 21 U.S.C. section 360bbb-3(b)(1), unless the  authorization is terminated or revoked.     Resp Syncytial Virus by PCR NEGATIVE NEGATIVE Final    Comment: (NOTE) Fact Sheet for Patients: BloggerCourse.com  Fact Sheet for Healthcare Providers: SeriousBroker.it  This test is not yet approved or cleared by the [place] FDA and has been authorized for detection and/or diagnosis of SARS-CoV-2 by FDA under an Emergency Use Authorization (EUA). This EUA will remain in effect (meaning this test can be used) for the duration of the COVID-19 declaration under Section 564(b)(1) of the Act,  21 U.S.C. section 360bbb-3(b)(1), unless the authorization is terminated or revoked.  Performed at [facility], [address], [place], [place] [address]   Blood Culture (routine x 2)     Status: None (Preliminary result)   Collection Time: 04/30/23  1:40 PM   Specimen: BLOOD  Result Value Ref Range Status   Specimen Description   Final    BLOOD RIGHT ANTECUBITAL Performed at [facility], [address] [place], [place] [address]    Special Requests   Final    BOTTLES DRAWN AEROBIC AND ANAEROBIC Blood Culture results may not be optimal due to an excessive volume of blood received in culture bottles Performed at [facility], [address], [place], [place] [address]    Culture   Final    NO GROWTH 3 DAYS Performed at [facility], [address] [place], [place] [address]    Report Status PENDING  Incomplete  Resp panel by RT-PCR (RSV, Flu A&B, Covid) Anterior Nasal Swab     Status: None   Collection Time: 04/30/23  1:50 PM   Specimen: Anterior Nasal Swab  Result Value Ref Range Status   SARS Coronavirus 2 by RT PCR NEGATIVE NEGATIVE Final    Comment: (NOTE) SARS-CoV-2 target nucleic acids are NOT DETECTED.  The SARS-CoV-2 RNA is generally detectable in upper respiratory specimens during the acute phase of infection. The  lowest concentration of SARS-CoV-2 viral copies this assay can detect is 138 copies/mL. A negative result does not preclude SARS-Cov-2 infection and should not be used as the sole basis for treatment or other patient management decisions. A negative result may occur with  improper specimen collection/handling, submission of specimen other than nasopharyngeal swab, presence of viral mutation(s) within the areas targeted by this assay, and inadequate number of viral copies(<138 copies/mL). A negative result must be combined with clinical observations, patient history, and epidemiological information. The expected result is Negative.  Fact Sheet for Patients:  BloggerCourse.com  Fact Sheet for Healthcare Providers:  SeriousBroker.it  This test is no t yet approved or cleared by the [place] FDA and  has been authorized for detection and/or diagnosis of SARS-CoV-2 by FDA under an Emergency Use Authorization (EUA). This EUA will remain  in effect (meaning this test can be used) for the duration of the COVID-19 declaration under Section 564(b)(1) of the Act, 21 U.S.C.section 360bbb-3(b)(1), unless the authorization is terminated  or revoked sooner.       Influenza A by PCR NEGATIVE NEGATIVE Final   Influenza B by PCR NEGATIVE NEGATIVE Final    Comment: (NOTE) The Xpert Xpress SARS-CoV-2/FLU/RSV plus assay is intended as an aid in the diagnosis of influenza from Nasopharyngeal swab specimens and should not be used as a sole basis for treatment. Nasal washings and aspirates are unacceptable for Xpert Xpress SARS-CoV-2/FLU/RSV testing.  Fact Sheet for Patients: BloggerCourse.com  Fact Sheet for Healthcare Providers: SeriousBroker.it  This test is not yet approved or cleared by the [place] FDA and has been authorized for detection and/or diagnosis of SARS-CoV-2 by FDA under  an Emergency Use Authorization (EUA). This EUA will remain in effect (meaning this test can be used) for the duration of the COVID-19 declaration under Section 564(b)(1) of the Act, 21 U.S.C. section 360bbb-3(b)(1), unless the authorization is terminated or revoked.     Resp Syncytial Virus by PCR NEGATIVE NEGATIVE Final    Comment: (NOTE) Fact Sheet for Patients: BloggerCourse.com  Fact Sheet for Healthcare  Providers: SeriousBroker.it  This test is not yet approved or cleared by the [place] and has been authorized for detection and/or diagnosis of SARS-CoV-2 by FDA under an Emergency Use Authorization (EUA). This EUA will remain in effect (meaning this test can be used) for the duration of the COVID-19 declaration under Section 564(b)(1) of the Act, 21 U.S.C. section 360bbb-3(b)(1), unless the authorization is terminated or revoked.  Performed at [facility], [address], [place], [place] [address]   Blood Culture (routine x 2)     Status: None (Preliminary result)   Collection Time: 04/30/23  2:05 PM   Specimen: BLOOD LEFT FOREARM  Result Value Ref Range Status   Specimen Description   Final    BLOOD LEFT FOREARM Performed at [facility], [address], [place], [place] [address]    Special Requests   Final    BOTTLES DRAWN AEROBIC AND ANAEROBIC Blood Culture adequate volume Performed at [facility], [address], [place], [place] [address]    Culture   Final    NO GROWTH 3 DAYS Performed at [facility], [address], [place], [place] [address]    Report Status PENDING  Incomplete     Radiology Studies: DG CHEST PORT 1 VIEW Result Date: 05/03/2023 CLINICAL DATA:  Hypoxia. EXAM: PORTABLE CHEST 1 VIEW COMPARISON:  May 02, 2023. FINDINGS: Stable cardiomediastinal silhouette. Right lung opacities are noted concerning for pneumonia.  Probable small right pleural effusion is noted. Left lung is clear. Bony thorax is unremarkable. IMPRESSION: Right lung opacities are noted concerning for pneumonia with probable small right pleural effusion. Electronically Signed   By: Lupita Raider M.D.   On: 05/03/2023 10:13   DG CHEST PORT 1 VIEW Result Date: 05/02/2023 CLINICAL DATA:  Shortness of breath. EXAM: PORTABLE CHEST 1 VIEW COMPARISON:  April 30, 2023 FINDINGS: The heart size and mediastinal contours are within normal limits. Mild bilateral ill-defined infiltrates are seen. Mild areas of atelectasis and/or infiltrate is also noted within the bilateral lung bases, right greater than left. There are small bilateral pleural effusions. No pneumothorax is identified. The visualized skeletal structures are unremarkable. IMPRESSION: 1. Mild bilateral ill-defined infiltrates with mild bibasilar atelectasis and/or infiltrate. 2. Small bilateral pleural effusions. Electronically Signed   By: Aram Candela M.D.   On: 05/02/2023 19:33    Scheduled Meds:  [START ON 05/04/2023] aspirin  325 mg Oral Daily   chlorthalidone  25 mg Oral Daily   cholecalciferol  2,000 Units Oral Daily   doxazosin  2 mg Oral Q12H   doxycycline  100 mg Oral Q12H   enoxaparin (LOVENOX) injection  40 mg Subcutaneous Q24H   eplerenone  25 mg Oral Daily   finasteride  5 mg Oral Daily   hydrALAZINE  50 mg Oral TID   ipratropium-albuterol  3 mL Nebulization TID   Continuous Infusions:  sodium chloride 10 mL/hr at 05/03/23 0521   piperacillin-tazobactam (ZOSYN)  IV 3.375 g (05/03/23 1146)   potassium chloride 10 mEq (05/03/23 1143)   [START ON 05/04/2023] vancomycin       LOS: 2 days   Burnadette Pop, MD [facility] P4/08/2023, 1:42 PM

## 2023-05-03 NOTE — Consult Note (Signed)
 NAME:  Patrick Villa, MRN:  [id number], DOB:  11-29-1933, LOS: 2 ADMISSION DATE:  04/30/2023, CONSULTATION DATE:  4/8 REFERRING MD:  Trevor Mace, CHIEF COMPLAINT:  hypoxia   History of Present Illness:  Patrick Villa is an 89 y/o gentleman with a history of COPD, type 2 diabetes who presented with weakness, shortness of breath that are worse on the day of presentation but had been present for about 2 weeks.  In the emergency department he was saturating 87%.  Prior to presentation to the hospital he had been treated with doxycycline and Augmentin as an outpatient.  He was admitted for low saturations and failure of outpatient antibiotics.  Since admission he has been continued on azithromycin and ceftriaxone.  He was on 2 L nasal cannula from 4/5 until 4/7 when he jumped up to 7 L oxygen in the afternoon on 4/7. Today he feels ok; overall he reports feeling improved since admission. His nurse is concerning with how quickly his oxygen needs have risen in the past day. He denies reflux/ GERD symptoms and denies aspiration when eating/ drinking.  No family history of lung disease. He did not know he had COPD and is not on inhalers PTA. He quit smoking in the 80s after ~1ppd x ~40 years. He worked as a Technical sales engineer.   Pertinent  Medical History  HLD HTN DM COPD  Significant Hospital Events: Including procedures, antibiotic start and stop dates in addition to other pertinent events   4/5 admitted for pneumonia, started ceftriaxone and azithromycin 4/7 increasing oxygen requirements  Interim History / Subjective:    Objective   Blood pressure (!) 158/78, pulse 92, temperature 99.6 F (37.6 C), temperature source Oral, resp. rate 19, height 5\' 6"  (1.676 m), weight 83.5 kg, SpO2 90%.        Intake/Output Summary (Last 24 hours) at 05/03/2023 0923 Last data filed at 05/03/2023 0500 Gross per 24 hour  Intake 490 ml  Output 650 ml  Net -160 ml   Filed Weights   04/30/23 1234 04/30/23 1813  Weight:  83.5 kg 83.5 kg    Examination: General: elderly man lying in bed in NAD HENT: [place]/AT, eyes anicteric Lungs: minimal tachypnea, no accessory muscle use. Decreased basilar breath sounds Cardiovascular: S1S2, RRR Abdomen: soft, NT Extremities: no edema Neuro: awake, alert, answering questions appropriately. Needs help to sit up EOB, but moving all extremities GU: external catheter  Bedside US: B lines, no effusion on the R. Consolidation in RLL. No effusion on the left, B lines present.  CXR personally reviewed> R dependent pleural effusion, increased interstitial markings centrally suspicious for pulmonary edema. Air bronchograms RLL & LUL. CTA chest 4/1 personally reviewed-paraseptal emphysema, multifocal infiltrates in all lobes. No acute PE.  WBC 6.8 H/H 11.9/39.4 Platelets 293 COVID, flu, RSV negative  BNP 430 on 4/1   Resolved Hospital Problem list     Assessment & Plan:  Acute respiratory failure with hypoxia CXR concerning for effusion, but not visible with [place]. Has dense pneumonia in RLL and pulmonary edema in upper regions of the lungs on [place] -chest CT to evaluate for loculated effusion not visible on [place] -broaden antibiotics- zosyn, vanc -recheck BNP level -collect sputum culture -pulmonary hygiene -check legionella and pneumococcal antigens -oxygen increased to 9L with activity while I was at bedside.  Plan d/w RN, daughter, primary team.   Best Practice (right click and "Reselect all SmartList Selections" daily)   Per primary  Labs   CBC: Recent Labs  Lab  04/26/23 1323 04/30/23 1350 05/01/23 0515 05/03/23 0525  WBC 7.0 6.5 6.3 6.8  NEUTROABS 5.4 5.2 4.6  --   HGB 11.9* 10.9* 10.5* 11.9*  HCT 36.8* 34.0* 34.1* 39.4  MCV 84.0 83.1 87.4 86.8  PLT 246 270 232 293    Basic Metabolic Panel: Recent Labs  Lab 04/26/23 1429 04/30/23 1350 05/01/23 0515 05/02/23 0446 05/03/23 0525  NA 137 138 140 141 137  K 3.3* 3.1* 3.1* 2.9* 3.3*  CL 100 101 105  100 101  CO2 28 26 24 26 25   GLUCOSE 97 110* 96 122* 125*  BUN 22 24* 21 25* 28*  CREATININE 1.24 1.52* 1.18 1.51* 1.38*  CALCIUM 9.5 9.9 9.0 9.6 9.3  MG  --   --  1.7  --  2.3   GFR: Estimated Creatinine Clearance: 38.2 mL/min (A) (by C-G formula based on SCr of 1.38 mg/dL (H)). Recent Labs  Lab 04/26/23 1323 04/30/23 1350 04/30/23 1537 05/01/23 0515 05/03/23 0525  WBC 7.0 6.5  --  6.3 6.8  LATICACIDVEN  --  1.5 1.0  --   --     Liver Function Tests: Recent Labs  Lab 04/26/23 1429 04/30/23 1350 05/01/23 0515  AST 28 50* 61*  ALT 18 29 35  ALKPHOS 34* 52 49  BILITOT 0.6 0.4 0.5  PROT 6.9 7.0 6.2*  ALBUMIN 3.5 3.4* 2.4*   No results for input(s): "LIPASE", "AMYLASE" in the last 168 hours. No results for input(s): "AMMONIA" in the last 168 hours.  ABG    Component Value Date/Time   PHART 7.52 (H) 05/02/2023 1643   PCO2ART 38 05/02/2023 1643   PO2ART 60 (L) 05/02/2023 1643   HCO3 31.0 (H) 05/02/2023 1643   O2SAT 92.1 05/02/2023 1643     Coagulation Profile: Recent Labs  Lab 04/30/23 1350  INR 1.1    Cardiac Enzymes: No results for input(s): "CKTOTAL", "CKMB", "CKMBINDEX", "TROPONINI" in the last 168 hours.  HbA1C: Hgb A1c MFr Bld  Date/Time Value Ref Range Status  02/27/2021 10:34 AM 6.4 (H) <5.7 % of total Hgb Final    Comment:    For someone without known diabetes, a hemoglobin  A1c value between 5.7% and 6.4% is consistent with prediabetes and should be confirmed with a  follow-up test. . For someone with known diabetes, a value <7% indicates that their diabetes is well controlled. A1c targets should be individualized based on duration of diabetes, age, comorbid conditions, and other considerations. . This assay result is consistent with an increased risk of diabetes. . Currently, no consensus exists regarding use of hemoglobin A1c for diagnosis of diabetes for children. .   10/27/2020 08:40 AM 6.3 (H) <5.7 % of total Hgb Final     Comment:    For someone without known diabetes, a hemoglobin  A1c value between 5.7% and 6.4% is consistent with prediabetes and should be confirmed with a  follow-up test. . For someone with known diabetes, a value <7% indicates that their diabetes is well controlled. A1c targets should be individualized based on duration of diabetes, age, comorbid conditions, and other considerations. . This assay result is consistent with an increased risk of diabetes. . Currently, no consensus exists regarding use of hemoglobin A1c for diagnosis of diabetes for children. .     CBG: No results for input(s): "GLUCAP" in the last 168 hours.  Review of Systems:   Review of Systems  Constitutional:  Positive for fever.  HENT: Negative.    Respiratory:  Positive for cough and sputum production. Negative for hemoptysis.   Gastrointestinal: Negative.   Musculoskeletal: Negative.   Skin:  Negative for rash.     Past Medical History:  He,  has a past medical history of Colon polyps, COPD (chronic obstructive pulmonary disease) (HCC), Diabetes mellitus without complication (HCC) (05/01/2020), Echocardiogram abnormal, Ganglion cyst, Hematuria, Hyperlipidemia, Hypertension, and Systolic murmur.   Surgical History:   Past Surgical History:  Procedure Laterality Date   COLON SURGERY     TRANSTHORACIC ECHOCARDIOGRAM       Social History:   reports that he quit smoking about 41 years ago. His smoking use included cigarettes. He has never used smokeless tobacco. He reports that he does not drink alcohol and does not use drugs.   Family History:  His family history includes Diabetes in his mother; Heart disease in his father and mother; Stroke in his brother.   Allergies Allergies  Allergen Reactions   Amlodipine Other (See Comments)    Patient developed jitteriness, tremors and unstable gait.  Same issues when re-challenged   Lipitor [Atorvastatin Calcium]     Weakness.   Zocor  [Simvastatin]    Bactrim [Sulfamethoxazole-Trimethoprim] Rash   Pravachol Hives and Rash     Home Medications  Prior to Admission medications   Medication Sig Start Date End Date Taking? Authorizing Provider  acetaminophen (TYLENOL) 650 MG CR tablet Take 1,300 mg by mouth every 8 (eight) hours as needed for pain.   Yes [provider]  chlorthalidone (HYGROTON) 25 MG tablet Take 1 tablet (25 mg total) by mouth daily. 2nd attempt. Pt needs yearly appt for any future refills. Please call office to schedule appt @ [phone]. Patient taking differently: Take 25 mg by mouth in the morning. 2nd attempt. Pt needs yearly appt for any future refills. Please call office to schedule appt @ [phone]. 12/30/22  Yes Jodelle Red, MD  finasteride (PROSCAR) 5 MG tablet Take 1 tablet (5 mg total) by mouth daily. Patient taking differently: Take 5 mg by mouth in the morning. 02/25/21  Yes Cathlean Marseilles A, NP  hydrALAZINE (APRESOLINE) 50 MG tablet TAKE 1 TABLET BY MOUTH 3 TIMES  DAILY Patient taking differently: Take 50 mg by mouth in the morning and at bedtime. 11/18/22  Yes Jodelle Red, MD  terazosin (HYTRIN) 5 MG capsule Take 5 mg by mouth at bedtime.   Yes [provider]     Critical care time: n/a       Steffanie Dunn, DO 05/03/23 9:23 AM [facility]  For contact information, see Amion. If no response to pager, please call PCCM consult pager. After hours, 7PM- 7AM, please call Elink.

## 2023-05-04 DIAGNOSIS — I5021 Acute systolic (congestive) heart failure: Secondary | ICD-10-CM | POA: Diagnosis not present

## 2023-05-04 DIAGNOSIS — R9389 Abnormal findings on diagnostic imaging of other specified body structures: Secondary | ICD-10-CM | POA: Diagnosis not present

## 2023-05-04 DIAGNOSIS — J9601 Acute respiratory failure with hypoxia: Secondary | ICD-10-CM | POA: Diagnosis not present

## 2023-05-04 DIAGNOSIS — N179 Acute kidney failure, unspecified: Secondary | ICD-10-CM | POA: Diagnosis not present

## 2023-05-04 DIAGNOSIS — J189 Pneumonia, unspecified organism: Secondary | ICD-10-CM | POA: Diagnosis not present

## 2023-05-04 LAB — BASIC METABOLIC PANEL WITH GFR
Anion gap: 13 (ref 5–15)
BUN: 37 mg/dL — ABNORMAL HIGH (ref 8–23)
CO2: 27 mmol/L (ref 22–32)
Calcium: 9.1 mg/dL (ref 8.9–10.3)
Chloride: 96 mmol/L — ABNORMAL LOW (ref 98–111)
Creatinine, Ser: 1.78 mg/dL — ABNORMAL HIGH (ref 0.61–1.24)
GFR, Estimated: 36 mL/min — ABNORMAL LOW (ref >60)
Glucose, Bld: 155 mg/dL — ABNORMAL HIGH (ref 70–99)
Potassium: 3 mmol/L — ABNORMAL LOW (ref 3.5–5.1)
Sodium: 136 mmol/L (ref 135–145)

## 2023-05-04 LAB — CBC
HCT: 36 % — ABNORMAL LOW (ref 39.0–52.0)
Hemoglobin: 11.5 g/dL — ABNORMAL LOW (ref 13.0–17.0)
MCH: 27.2 pg (ref 26.0–34.0)
MCHC: 31.9 g/dL (ref 30.0–36.0)
MCV: 85.1 fL (ref 80.0–100.0)
Platelets: 277 10*3/uL (ref 150–400)
RBC: 4.23 MIL/uL (ref 4.22–5.81)
RDW: 13.5 % (ref 11.5–15.5)
WBC: 10.4 10*3/uL (ref 4.0–10.5)
nRBC: 0 % (ref 0.0–0.2)

## 2023-05-04 MED ORDER — GUAIFENESIN-DM 100-10 MG/5ML PO SYRP: 5.0000 mL | ORAL_SOLUTION | ORAL | Status: DC | PRN

## 2023-05-04 NOTE — Progress Notes (Signed)
 Patient Name: Patrick Villa Date of Encounter: 05/04/2023  [facility] Cardiologist: Jodelle Red, MD   Interval Summary  .    Feeling better.  Breathing improving.   Vital Signs .    Vitals:   05/03/23 2012 05/04/23 0407 05/04/23 0637 05/04/23 1330  BP:    122/64  Pulse:  88  89  Resp:  19  18  Temp:  98.9 F (37.2 C)  98.7 F (37.1 C)  TempSrc:  Oral    SpO2: 93% 92% 93% (!) 88%  Weight:      Height:        Intake/Output Summary (Last 24 hours) at 05/04/2023 1411 Last data filed at 05/04/2023 0654 Gross per 24 hour  Intake 842.5 ml  Output 1200 ml  Net -357.5 ml      04/30/2023    6:13 PM 04/30/2023   12:34 PM 04/26/2023   12:46 PM  Last 3 Weights  Weight (lbs) 184 lb 1.4 oz 184 lb 1.4 oz 184 lb  Weight (kg) 83.5 kg 83.5 kg 83.462 kg      Telemetry/ECG    Sinus rhythm.  Sinus tachycardia.  PVCs - Personally Reviewed  Physical Exam .    VS:  BP 122/64 (BP Location: Left Arm)   Pulse 89   Temp 98.7 F (37.1 C)   Resp 18   Ht 5\' 6"  (1.676 m)   Wt 83.5 kg   SpO2 (!) 88%   BMI 29.71 kg/m  , BMI Body mass index is 29.71 kg/m. GENERAL:  No acute distress.  HEENT: Pupils equal round and reactive, fundi not visualized, oral mucosa unremarkable NECK:  No jugular venous distention, waveform within normal limits, carotid upstroke brisk and symmetric, no bruits, no thyromegaly LUNGS:  Bilateral rhonchi HEART:  RRR.  PMI not displaced or sustained,S1 and S2 within normal limits, no S3, no S4, no clicks, no rubs, no murmurs ABD:  Flat, positive bowel sounds normal in frequency in pitch, no bruits, no rebound, no guarding, no midline pulsatile mass, no hepatomegaly, no splenomegaly EXT:  2 plus pulses throughout, no edema, no cyanosis no clubbing SKIN:  No rashes no nodules NEURO:  Cranial nerves II through XII grossly intact, motor grossly intact throughout PSYCH:  Cognitively intact, oriented to person place and time   Assessment & Plan .     Mr.  Patrick Villa is an 27M with hypertension, hyperlipidemia, aortic atherosclerosis, coronary calcification admitted with fever and lethargy.  Found to have reduced systolic function and multifocal pneumonia.    # Hypoxia: # Multifocal pneumonia:  Feeling much better.  Breathing continues to improve. On broad antibiotic coverage per primary team.    # Heart failure with moderately reduced EF, acute: LVEF estimation is 40-45%.  This is new.  LVEF was previously normal.  Global hypokinesis.  Multiple possible etiologies.  This could be due to hypertension, though he only has mild LVH.  He reports average blood pressures at home have been in the 140s. Renal function worsened with diuresis and he now appears to be euvolemic.  He didn't tolerate spironolactone.  We added eplerenone and BP is better but renal function worse.  Recommend holding eplerenone for now.  Can resume when renal function at baseline.    # CAD:  # Hyperlipidemia:  On review of his CTA he does have diffuse coronary and aortic atherosclerosis.  His cardiomyopathy could be ischemic, though he has not had any ischemic symptoms.  Stress testing was negative in  2021. Given his pneumonia and lack of ischemic symptoms, will defer ischemic evaluation to outpatient setting.  Repeat echo and ischemic evaluation if LVEF remains depressed.     # [place] I: Avoid nodal agents.   # Hyperlipidemia: LDL goal <70.   For questions or updates, please contact [facility] Please consult [email or web address] for contact info under        Signed, Chilton Si, MD

## 2023-05-04 NOTE — Progress Notes (Addendum)
 NAME:  Patrick Villa, MRN:  [id number], DOB:  October 23, 1933, LOS: 3 ADMISSION DATE:  04/30/2023, CONSULTATION DATE:  4/8 REFERRING MD:  Patrick Villa, CHIEF COMPLAINT:  hypoxia   History of Present Illness:  Patrick Villa is an 89 y/o gentleman with a history of COPD, type 2 diabetes who presented with weakness, shortness of breath that are worse on the day of presentation but had been present for about 2 weeks.  In the emergency department he was saturating 87%.  Prior to presentation to the hospital he had been treated with doxycycline and Augmentin as an outpatient.  He was admitted for low saturations and failure of outpatient antibiotics.  Since admission he has been continued on azithromycin and ceftriaxone.  He was on 2 L nasal cannula from 4/5 until 4/7 when he jumped up to 7 L oxygen in the afternoon on 4/7. Today he feels ok; overall he reports feeling improved since admission. His nurse is concerning with how quickly his oxygen needs have risen in the past day. He denies reflux/ GERD symptoms and denies aspiration when eating/ drinking.  No family history of lung disease. He did not know he had COPD and is not on inhalers PTA. He quit smoking in the 80s after ~1ppd x ~40 years. He worked as a Technical sales engineer.   Pertinent  Medical History  HLD HTN DM COPD  Significant Hospital Events: Including procedures, antibiotic start and stop dates in addition to other pertinent events   4/5 admitted for pneumonia, started ceftriaxone and azithromycin 4/7 increasing oxygen requirements  Interim History / Subjective:  Remains on 9L HFNC. Tmax 99.5. Breathing feels stable. Coughing up more today.  Objective   Blood pressure 138/67, pulse 88, temperature 98.9 F (37.2 C), temperature source Oral, resp. rate 19, height 5\' 6"  (1.676 m), weight 83.5 kg, SpO2 93%.        Intake/Output Summary (Last 24 hours) at 05/04/2023 0914 Last data filed at 05/04/2023 0654 Gross per 24 hour  Intake 842.5 ml  Output 1600 ml   Net -757.5 ml   Filed Weights   04/30/23 1234 04/30/23 1813  Weight: 83.5 kg 83.5 kg    Examination: General: Elderly man lying in bed no acute distress HENT: [place]/AT, eyes anicteric Lungs: No accessory muscle use, no conversational dyspnea.  Minimal faint rales. Cardiovascular: S1-S2, regular rate and rhythm Abdomen: soft, NT Extremities: No peripheral edema Neuro: Awake, alert, answering questions appropriately  CT with multilobar diffuse groundglass, progressed in geographic distribution compared to previous CT scan  BUN 37 Cr 1.78 WBC 10.4 H/H 11.5/36 Platelets 277 COVID, flu, RSV negative EVP negative  BNP 158 ESR 83 CRP 18.4 Strep pneumo negative   Resolved Hospital Problem list     Assessment & Plan:  Acute respiratory failure with hypoxia With progression on CT and diffuse groundglass worry about [place].  Diuresis his renal function bumped significantly and he does not have significant peripheral edema on exam making acute pulmonary edema less likely as a cause for his worsening. -No additional diuretics needed - Continue broad antibiotics-Zosyn, stopping Vanc per pharmacy's recommendation.  MRSA nares negative. -Continue Solu-Medrol 125 mg daily - Collect sputum culture -Wean supplemental oxygen as able to maintain SpO2 greater than 90-94%.  No room to wean oxygen yet -Repeat chest x-ray tomorrow -Pulmonary hygiene    Best Practice (right click and "Reselect all SmartList Selections" daily)   Per primary  Labs   CBC: Recent Labs  Lab 04/30/23 1350 05/01/23 0515 05/03/23 0525 05/04/23  0447  WBC 6.5 6.3 6.8 10.4  NEUTROABS 5.2 4.6  --   --   HGB 10.9* 10.5* 11.9* 11.5*  HCT 34.0* 34.1* 39.4 36.0*  MCV 83.1 87.4 86.8 85.1  PLT 270 232 293 277    Basic Metabolic Panel: Recent Labs  Lab 04/30/23 1350 05/01/23 0515 05/02/23 0446 05/03/23 0525 05/04/23 0447  NA 138 140 141 137 136  K 3.1* 3.1* 2.9* 3.3* 3.0*  CL 101 105 100 101 96*  CO2  26 24 26 25 27   GLUCOSE 110* 96 122* 125* 155*  BUN 24* 21 25* 28* 37*  CREATININE 1.52* 1.18 1.51* 1.38* 1.78*  CALCIUM 9.9 9.0 9.6 9.3 9.1  MG  --  1.7  --  2.3  --    GFR: Estimated Creatinine Clearance: 29.7 mL/min (A) (by C-G formula based on SCr of 1.78 mg/dL (H)). Recent Labs  Lab 04/30/23 1350 04/30/23 1537 05/01/23 0515 05/03/23 0525 05/04/23 0447  WBC 6.5  --  6.3 6.8 10.4  LATICACIDVEN 1.5 1.0  --   --   --     Liver Function Tests: Recent Labs  Lab 04/30/23 1350 05/01/23 0515  AST 50* 61*  ALT 29 35  ALKPHOS 52 49  BILITOT 0.4 0.5  PROT 7.0 6.2*  ALBUMIN 3.4* 2.4*     Critical care time: n/a       Steffanie Dunn, DO 05/04/23 10:57 AM [facility]  For contact information, see Amion. If no response to pager, please call PCCM consult pager. After hours, 7PM- 7AM, please call Elink.

## 2023-05-04 NOTE — Progress Notes (Signed)
 Physical Therapy Treatment Patient Details Name: Patrick Villa MRN: [id number] DOB: 05-03-1933 Today's Date: 05/04/2023   History of Present Illness 89 yo male admitted with Pna, acute HF, suspicion for BOOP. Hx of COPD, DM, BPH    PT Comments  Pt agreeable to working with therapy. He reports feeling okay. Some dyspnea observed at rest-this increased with minimal activity. O2 86% on 9L at rest upon my arrival. Cues for pursed lip/deep breathing.  Increased O2 to 11L in preparation for activity. O2 up to 91%. O2 dropped to 78% on 11L HFNC with pt pulling himself into long-sitting to drink some water. Coughing after drinking water. Cues for deep breaths/pursed lip breathing. Sats eventually recovered to 89%. Had pt practice spirometer breathing, 5 reps, only pulling about 100. End of session O2 88% on 9L. Deferred OOB on today. Made RN aware.    If plan is discharge home, recommend the following: A little help with walking and/or transfers;A little help with bathing/dressing/bathroom;Assistance with cooking/housework;Assist for transportation;Help with stairs or ramp for entrance   Can travel by private vehicle        Equipment Recommendations   (continuing to assess)    Recommendations for Other Services       Precautions / Restrictions Precautions Precautions: Fall Precaution/Restrictions Comments: monitor O2-now on HFNC Restrictions      Mobility  Bed Mobility Overal bed mobility: Needs Assistance Bed Mobility: Supine to Sit     Supine to sit: Supervision, HOB elevated, Used rails     General bed mobility comments: O2 dropped to 78% on 11L HFNC with pt pulling himself into long-sitting to drink some water. Coughing after drinking water. Cues for deep breaths/pursed lip breathing. Sats eventually recovered to 89%. Had pt practice spirometer breathing, 5 reps, only pulling about 100. Deferred OOB on today.    Transfers                        Ambulation/Gait                    Stairs             Wheelchair Mobility     Tilt Bed    Modified Rankin (Stroke Patients Only)       Balance                                            Communication Communication Communication: No apparent difficulties  Cognition Arousal: Alert Behavior During Therapy: WFL for tasks assessed/performed   PT - Cognitive impairments: No apparent impairments                         Following commands: Intact      Cueing Cueing Techniques: Verbal cues  Exercises      General Comments        Pertinent Vitals/Pain Pain Assessment Pain Assessment: No/denies pain    Home Living                          Prior Function            PT Goals (current goals can now be found in the care plan section) Progress towards PT goals: Not progressing toward goals - comment (limited by O2 sats)    Frequency  Min 2X/week      PT Plan      Co-evaluation              AM-PAC PT "6 Clicks" Mobility   Outcome Measure  Help needed turning from your back to your side while in a flat bed without using bedrails?: A Little Help needed moving from lying on your back to sitting on the side of a flat bed without using bedrails?: A Little Help needed moving to and from a bed to a chair (including a wheelchair)?: Total Help needed standing up from a chair using your arms (e.g., wheelchair or bedside chair)?: Total Help needed to walk in hospital room?: Total Help needed climbing 3-5 steps with a railing? : Total 6 Click Score: 10    End of Session   Activity Tolerance: Patient tolerated treatment well (limited by O2 sats) Patient left: in bed;with call bell/phone within reach;with bed alarm set;with nursing/sitter in room   PT Visit Diagnosis: Difficulty in walking, not elsewhere classified (R26.2)     Time: 1030-1055 PT Time Calculation (min) (ACUTE ONLY): 25 min  Charges:    $Therapeutic Activity:  23-37 mins PT General Charges $$ ACUTE PT VISIT: 1 Visit                        Faye Ramsay, PT Acute Rehabilitation  Office: [phone]

## 2023-05-04 NOTE — Progress Notes (Addendum)
 PROGRESS NOTE  Patrick Villa  JYN:[id number] DOB: July 27, 1933 DOA: 04/30/2023 PCP: Ollen Bowl, MD   Brief Narrative: Patient is a 89 year old male with history of COPD, diabetes mellitus, hyperlipidemia, hypertension, BPH who presented to the device department with complaint of generalized weakness, shortness of breath, nonproductive cough, fever.  On presentation,he was hypoxic at 87%.  She was recently treated for multifocal pneumonia with Augmentin and doxycycline as an outpatient but continued to have symptoms.  On presentation, he was febrile up to one 101 .69F, saturating 89% on room air, potassium 3.1, creatinine 1.5.  Chest x-ray showed patchy airspace opacity with bilateral pleural effusion, possible mild pulmonary edema.  Patient admitted for further management of multifocal pneumonia not responding to outpatient antibiotic therapy.  Also suspected to have volume overload, started on IV Lasix.  Echo showed EF of 40 to 45%, cardiology also consulted.  Hospital course remarkable for worsening respiratory status, no 9 L of oxygen.  PCCM also consulted.  Suspicion for BOOP, plan is to continue antibiotics.  Assessment & Plan:  Principal Problem:   Multifocal pneumonia Active Problems:   Essential hypertension   COPD (chronic obstructive pulmonary disease) (HCC)   Hyperlipidemia   BPH (benign prostatic hyperplasia)   Hypokalemia   AKI (acute kidney injury) (HCC)   Hypoxia   Acute systolic heart failure (HCC)   Aortic atherosclerosis (HCC)   Coronary artery calcification   Multifocal pneumonia/community-acquired pneumonia/suspicion for BOOP:He was recently treated for multifocal pneumonia with Augmentin and doxycycline as an outpatient but continued to have symptoms.  Chest x-ray showed patchy airspace opacity with suspicion of bilateral pleural effusion, possible mild pulmonary edema.  Started on broad-spectrum antibiotics.  Lactic acid was normal on presentation.  Follow-up  cultures:NGTD.  No leukocytosis or fever this morning.  Chest x-ray follow-up on 4/8  showed right sided pneumonia.  PCCM consulted due to worsening respiratory status.  Plan for doing CT chest,report pending.  Antibiotics changed to  Zosyn.  Will also continue doxycycline because of history of recent tick bites. Negative streptococcal pneumoniae antigen, pending,sputum culture.  Suspicion for BOOP  Acute HFrEF: Does not have peripheral edema but had bilateral crackles.  Chest imaging suggestive of features of pulmonary edema.  Elevated BNP as per 4/1.  Given  2 doses of Lasix IV 40 mg on 4/6.  Echo showed EF of 40 to 45%, global hypokinesis, grade 1 diastolic dysfunction.  Cardiology consulted, plan for a right/left heart cath when respiratory status is stable.  Lasix on hold because creatinine trended up  Acute hypoxic respiratory/COPD: Not on on oxygen at home.  Currently on 8-9 of high flow  oxygen.  Continue to wean and monitor.  He is a past smoker  AKI: Creatinine trended up to 1.7.  Baseline creatinine normal.  Will continue to hold Lasix for now.   Hypokalemia: Supplemented with potassium  Hypertension: Currently normotensive.  Continue home medications: Chlorthalidone, hydralazine.Continue PRN medication for severe hypertension.  Started on eplerenone.  Also on doxazosine  QMV:HQIONGEX doxazosin,proscar  Debility/deconditioning: Patient lives alone.  Ambulates well.  He daughter lives just a few minutes away.   PT/OT recommend home health on discharge          DVT prophylaxis:enoxaparin (LOVENOX) injection 40 mg Start: 04/30/23 2200     Code Status: Full Code  Family Communication:  discussed with daughter Eber Jones at bedside on 4/8  Patient status:Inpatient  Patient is from :Home  Anticipated discharge BM:WUXL  Estimated DC date: 2 to 3 days  Consultants: Cardiology, PCCM  Procedures:None  Antimicrobials:  Anti-infectives (From admission, onward)    Start      Dose/Rate Route Frequency Ordered Stop   05/04/23 1200  vancomycin (VANCOCIN) IVPB 1000 mg/200 mL premix  Status:  Discontinued        1,000 mg 200 mL/hr over 60 Minutes Intravenous Every 24 hours 05/03/23 0953 05/04/23 0916   05/03/23 1130  doxycycline (VIBRA-TABS) tablet 100 mg        100 mg Oral Every 12 hours 05/03/23 1043     05/03/23 1030  piperacillin-tazobactam (ZOSYN) IVPB 3.375 g        3.375 g 12.5 mL/hr over 240 Minutes Intravenous Every 8 hours 05/03/23 0943     05/03/23 1030  vancomycin (VANCOREADY) IVPB 1500 mg/300 mL        1,500 mg 150 mL/hr over 120 Minutes Intravenous  Once 05/03/23 0943 05/03/23 1249   05/01/23 1000  cefTRIAXone (ROCEPHIN) 2 g in sodium chloride 0.9 % 100 mL IVPB  Status:  Discontinued        2 g 200 mL/hr over 30 Minutes Intravenous Every 24 hours 04/30/23 1844 05/03/23 0940   05/01/23 1000  azithromycin (ZITHROMAX) 500 mg in sodium chloride 0.9 % 250 mL IVPB  Status:  Discontinued        500 mg 250 mL/hr over 60 Minutes Intravenous Every 24 hours 04/30/23 1844 05/03/23 1043   04/30/23 1345  cefTRIAXone (ROCEPHIN) 2 g in sodium chloride 0.9 % 100 mL IVPB        2 g 200 mL/hr over 30 Minutes Intravenous Once 04/30/23 1332 04/30/23 1455   04/30/23 1345  azithromycin (ZITHROMAX) 500 mg in sodium chloride 0.9 % 250 mL IVPB        500 mg 250 mL/hr over 60 Minutes Intravenous  Once 04/30/23 1332 04/30/23 1534       Subjective: Patient seen and examined at bedside today.  Remains on high flow oxygen.  Appears slightly dyspneic but says he is feeling okay.  Some labored breathing noted.  Not coughing that much.  Denies any abdominal pain or chest pain today.  Objective: Vitals:   05/03/23 1956 05/03/23 2012 05/04/23 0407 05/04/23 0637  BP: 138/67     Pulse: 94  88   Resp: 20  19   Temp: 99.5 F (37.5 C)  98.9 F (37.2 C)   TempSrc: Oral  Oral   SpO2: 93% 93% 92% 93%  Weight:      Height:        Intake/Output Summary (Last 24 hours) at  05/04/2023 1106 Last data filed at 05/04/2023 0654 Gross per 24 hour  Intake 842.5 ml  Output 1600 ml  Net -757.5 ml   Filed Weights   04/30/23 1234 04/30/23 1813  Weight: 83.5 kg 83.5 kg    Examination:  General exam: Overall comfortable, not in distress, very pleasant elderly male HEENT: PERRL Respiratory system: Bilateral rhonchi, crackles Cardiovascular system: S1 & S2 heard, RRR.  Gastrointestinal system: Abdomen is nondistended, soft and nontender. Central nervous system: Alert and oriented Extremities: No edema, no clubbing ,no cyanosis Skin: No rashes, no ulcers,no icterus    Data Reviewed: I have personally reviewed following labs and imaging studies  CBC: Recent Labs  Lab 04/30/23 1350 05/01/23 0515 05/03/23 0525 05/04/23 0447  WBC 6.5 6.3 6.8 10.4  NEUTROABS 5.2 4.6  --   --   HGB 10.9* 10.5* 11.9* 11.5*  HCT 34.0* 34.1* 39.4 36.0*  MCV 83.1 87.4  86.8 85.1  PLT 270 232 293 277   Basic Metabolic Panel: Recent Labs  Lab 04/30/23 1350 05/01/23 0515 05/02/23 0446 05/03/23 0525 05/04/23 0447  NA 138 140 141 137 136  K 3.1* 3.1* 2.9* 3.3* 3.0*  CL 101 105 100 101 96*  CO2 26 24 26 25 27   GLUCOSE 110* 96 122* 125* 155*  BUN 24* 21 25* 28* 37*  CREATININE 1.52* 1.18 1.51* 1.38* 1.78*  CALCIUM 9.9 9.0 9.6 9.3 9.1  MG  --  1.7  --  2.3  --      Recent Results (from the past 240 hours)  Resp panel by RT-PCR (RSV, Flu A&B, Covid) Anterior Nasal Swab     Status: None   Collection Time: 04/26/23  1:23 PM   Specimen: Anterior Nasal Swab  Result Value Ref Range Status   SARS Coronavirus 2 by RT PCR NEGATIVE NEGATIVE Final    Comment: (NOTE) SARS-CoV-2 target nucleic acids are NOT DETECTED.  The SARS-CoV-2 RNA is generally detectable in upper respiratory specimens during the acute phase of infection. The lowest concentration of SARS-CoV-2 viral copies this assay can detect is 138 copies/mL. A negative result does not preclude SARS-Cov-2 infection and  should not be used as the sole basis for treatment or other patient management decisions. A negative result may occur with  improper specimen collection/handling, submission of specimen other than nasopharyngeal swab, presence of viral mutation(s) within the areas targeted by this assay, and inadequate number of viral copies(<138 copies/mL). A negative result must be combined with clinical observations, patient history, and epidemiological information. The expected result is Negative.  Fact Sheet for Patients:  BloggerCourse.com  Fact Sheet for Healthcare Providers:  SeriousBroker.it  This test is no t yet approved or cleared by the [place] FDA and  has been authorized for detection and/or diagnosis of SARS-CoV-2 by FDA under an Emergency Use Authorization (EUA). This EUA will remain  in effect (meaning this test can be used) for the duration of the COVID-19 declaration under Section 564(b)(1) of the Act, 21 U.S.C.section 360bbb-3(b)(1), unless the authorization is terminated  or revoked sooner.       Influenza A by PCR NEGATIVE NEGATIVE Final   Influenza B by PCR NEGATIVE NEGATIVE Final    Comment: (NOTE) The Xpert Xpress SARS-CoV-2/FLU/RSV plus assay is intended as an aid in the diagnosis of influenza from Nasopharyngeal swab specimens and should not be used as a sole basis for treatment. Nasal washings and aspirates are unacceptable for Xpert Xpress SARS-CoV-2/FLU/RSV testing.  Fact Sheet for Patients: BloggerCourse.com  Fact Sheet for Healthcare Providers: SeriousBroker.it  This test is not yet approved or cleared by the [place] FDA and has been authorized for detection and/or diagnosis of SARS-CoV-2 by FDA under an Emergency Use Authorization (EUA). This EUA will remain in effect (meaning this test can be used) for the duration of the COVID-19 declaration  under Section 564(b)(1) of the Act, 21 U.S.C. section 360bbb-3(b)(1), unless the authorization is terminated or revoked.     Resp Syncytial Virus by PCR NEGATIVE NEGATIVE Final    Comment: (NOTE) Fact Sheet for Patients: BloggerCourse.com  Fact Sheet for Healthcare Providers: SeriousBroker.it  This test is not yet approved or cleared by the [place] FDA and has been authorized for detection and/or diagnosis of SARS-CoV-2 by FDA under an Emergency Use Authorization (EUA). This EUA will remain in effect (meaning this test can be used) for the duration of the COVID-19 declaration under Section 564(b)(1) of  the Act, 21 U.S.C. section 360bbb-3(b)(1), unless the authorization is terminated or revoked.  Performed at [facility], [address], [place], [place] [address]   Blood Culture (routine x 2)     Status: None (Preliminary result)   Collection Time: 04/30/23  1:40 PM   Specimen: BLOOD  Result Value Ref Range Status   Specimen Description   Final    BLOOD RIGHT ANTECUBITAL Performed at [facility], [address], [place], [place] [address]    Special Requests   Final    BOTTLES DRAWN AEROBIC AND ANAEROBIC Blood Culture results may not be optimal due to an excessive volume of blood received in culture bottles Performed at [facility], [address], [place], [place] [address]    Culture   Final    NO GROWTH 4 DAYS Performed at [facility], [address] [place], [place] [address]    Report Status PENDING  Incomplete  Resp panel by RT-PCR (RSV, Flu A&B, Covid) Anterior Nasal Swab     Status: None   Collection Time: 04/30/23  1:50 PM   Specimen: Anterior Nasal Swab  Result Value Ref Range Status   SARS Coronavirus 2 by RT PCR NEGATIVE NEGATIVE Final    Comment: (NOTE) SARS-CoV-2 target nucleic acids are NOT DETECTED.  The SARS-CoV-2 RNA is generally detectable  in upper respiratory specimens during the acute phase of infection. The lowest concentration of SARS-CoV-2 viral copies this assay can detect is 138 copies/mL. A negative result does not preclude SARS-Cov-2 infection and should not be used as the sole basis for treatment or other patient management decisions. A negative result may occur with  improper specimen collection/handling, submission of specimen other than nasopharyngeal swab, presence of viral mutation(s) within the areas targeted by this assay, and inadequate number of viral copies(<138 copies/mL). A negative result must be combined with clinical observations, patient history, and epidemiological information. The expected result is Negative.  Fact Sheet for Patients:  BloggerCourse.com  Fact Sheet for Healthcare Providers:  SeriousBroker.it  This test is no t yet approved or cleared by the [place] FDA and  has been authorized for detection and/or diagnosis of SARS-CoV-2 by FDA under an Emergency Use Authorization (EUA). This EUA will remain  in effect (meaning this test can be used) for the duration of the COVID-19 declaration under Section 564(b)(1) of the Act, 21 U.S.C.section 360bbb-3(b)(1), unless the authorization is terminated  or revoked sooner.       Influenza A by PCR NEGATIVE NEGATIVE Final   Influenza B by PCR NEGATIVE NEGATIVE Final    Comment: (NOTE) The Xpert Xpress SARS-CoV-2/FLU/RSV plus assay is intended as an aid in the diagnosis of influenza from Nasopharyngeal swab specimens and should not be used as a sole basis for treatment. Nasal washings and aspirates are unacceptable for Xpert Xpress SARS-CoV-2/FLU/RSV testing.  Fact Sheet for Patients: BloggerCourse.com  Fact Sheet for Healthcare Providers: SeriousBroker.it  This test is not yet approved or cleared by the [place] FDA and has  been authorized for detection and/or diagnosis of SARS-CoV-2 by FDA under an Emergency Use Authorization (EUA). This EUA will remain in effect (meaning this test can be used) for the duration of the COVID-19 declaration under Section 564(b)(1) of the Act, 21 U.S.C. section 360bbb-3(b)(1), unless the authorization is terminated or revoked.     Resp Syncytial Virus by PCR NEGATIVE NEGATIVE Final    Comment: (NOTE) Fact Sheet for Patients: BloggerCourse.com  Fact Sheet  for Healthcare Providers: SeriousBroker.it  This test is not yet approved or cleared by the [place] and has been authorized for detection and/or diagnosis of SARS-CoV-2 by FDA under an Emergency Use Authorization (EUA). This EUA will remain in effect (meaning this test can be used) for the duration of the COVID-19 declaration under Section 564(b)(1) of the Act, 21 U.S.C. section 360bbb-3(b)(1), unless the authorization is terminated or revoked.  Performed at [facility], [address], [place], [place] [address]   Blood Culture (routine x 2)     Status: None (Preliminary result)   Collection Time: 04/30/23  2:05 PM   Specimen: BLOOD LEFT FOREARM  Result Value Ref Range Status   Specimen Description   Final    BLOOD LEFT FOREARM Performed at [facility], [address], [place], [place] [address]    Special Requests   Final    BOTTLES DRAWN AEROBIC AND ANAEROBIC Blood Culture adequate volume Performed at [facility], [address] [place], [place] [address]    Culture   Final    NO GROWTH 4 DAYS Performed at [facility], [address], [place], [place] [address]    Report Status PENDING  Incomplete  MRSA Next Gen by PCR, Nasal     Status: None   Collection Time: 05/03/23  1:01 PM   Specimen: Nasal Mucosa; Nasal Swab  Result Value Ref Range Status   MRSA by PCR Next Gen NOT  DETECTED NOT DETECTED Final    Comment: (NOTE) The GeneXpert MRSA Assay (FDA approved for NASAL specimens only), is one component of a comprehensive MRSA colonization surveillance program. It is not intended to diagnose MRSA infection nor to guide or monitor treatment for MRSA infections. Test performance is not FDA approved in patients less than 47 years old. Performed at [facility], [address], [place], [place] [address]   Respiratory (~20 pathogens) panel by PCR     Status: None   Collection Time: 05/03/23  3:30 PM   Specimen: Nasopharyngeal Swab; Respiratory  Result Value Ref Range Status   Adenovirus NOT DETECTED NOT DETECTED Final   Coronavirus 229E NOT DETECTED NOT DETECTED Final    Comment: (NOTE) The Coronavirus on the Respiratory Panel, DOES NOT test for the novel  Coronavirus (2019 nCoV)    Coronavirus HKU1 NOT DETECTED NOT DETECTED Final   Coronavirus NL63 NOT DETECTED NOT DETECTED Final   Coronavirus OC43 NOT DETECTED NOT DETECTED Final   Metapneumovirus NOT DETECTED NOT DETECTED Final   Rhinovirus / Enterovirus NOT DETECTED NOT DETECTED Final   Influenza A NOT DETECTED NOT DETECTED Final   Influenza B NOT DETECTED NOT DETECTED Final   Parainfluenza Virus 1 NOT DETECTED NOT DETECTED Final   Parainfluenza Virus 2 NOT DETECTED NOT DETECTED Final   Parainfluenza Virus 3 NOT DETECTED NOT DETECTED Final   Parainfluenza Virus 4 NOT DETECTED NOT DETECTED Final   Respiratory Syncytial Virus NOT DETECTED NOT DETECTED Final   Bordetella pertussis NOT DETECTED NOT DETECTED Final   Bordetella Parapertussis NOT DETECTED NOT DETECTED Final   Chlamydophila pneumoniae NOT DETECTED NOT DETECTED Final   Mycoplasma pneumoniae NOT DETECTED NOT DETECTED Final    Comment: Performed at [facility], [address], [place], [place] [address]     Radiology Studies: DG CHEST PORT 1 VIEW Result Date: 05/03/2023 CLINICAL DATA:  Hypoxia. EXAM: PORTABLE CHEST 1  VIEW COMPARISON:  May 02, 2023. FINDINGS: Stable cardiomediastinal silhouette. Right  lung opacities are noted concerning for pneumonia. Probable small right pleural effusion is noted. Left lung is clear. Bony thorax is unremarkable. IMPRESSION: Right lung opacities are noted concerning for pneumonia with probable small right pleural effusion. Electronically Signed   By: Lupita Raider M.D.   On: 05/03/2023 10:13   DG CHEST PORT 1 VIEW Result Date: 05/02/2023 CLINICAL DATA:  Shortness of breath. EXAM: PORTABLE CHEST 1 VIEW COMPARISON:  April 30, 2023 FINDINGS: The heart size and mediastinal contours are within normal limits. Mild bilateral ill-defined infiltrates are seen. Mild areas of atelectasis and/or infiltrate is also noted within the bilateral lung bases, right greater than left. There are small bilateral pleural effusions. No pneumothorax is identified. The visualized skeletal structures are unremarkable. IMPRESSION: 1. Mild bilateral ill-defined infiltrates with mild bibasilar atelectasis and/or infiltrate. 2. Small bilateral pleural effusions. Electronically Signed   By: Aram Candela M.D.   On: 05/02/2023 19:33    Scheduled Meds:  aspirin  325 mg Oral Daily   chlorthalidone  25 mg Oral Daily   cholecalciferol  2,000 Units Oral Daily   doxazosin  2 mg Oral Q12H   doxycycline  100 mg Oral Q12H   enoxaparin (LOVENOX) injection  40 mg Subcutaneous Q24H   eplerenone  25 mg Oral Daily   finasteride  5 mg Oral Daily   hydrALAZINE  50 mg Oral TID   ipratropium-albuterol  3 mL Nebulization TID   methylPREDNISolone (SOLU-MEDROL) injection  125 mg Intravenous Daily   potassium chloride  40 mEq Oral BID   Continuous Infusions:  piperacillin-tazobactam (ZOSYN)  IV 3.375 g (05/04/23 1055)     LOS: 3 days   Burnadette Pop, MD [facility] P4/09/2023, 11:06 AM

## 2023-05-04 NOTE — Progress Notes (Signed)
 Pt's daughter "Roney Marion" called to check on her dad. Daughter updated appropriately approved per patient.

## 2023-05-04 NOTE — Progress Notes (Signed)
Heart Failure Navigator Progress Note  Assessed for Heart & Vascular TOC clinic readiness.  Patient does not meet criteria due to scheduled [facility] appointment on 05/02/2023. .   Navigator will sign off at this time.   Rhae Hammock, BSN, Scientist, clinical (histocompatibility and immunogenetics) Only

## 2023-05-04 NOTE — TOC Progression Note (Signed)
 Transition of Care [facility]) - Progression Note    Patient Details  Name: Patrick Villa MRN: [id number] Date of Birth: 1933/03/31  [facility] CM/SW Contact  Vaibhav Fogleman, Olegario Messier, RN Phone Number: 05/04/2023, 12:57 PM  Clinical Narrative:[facility] for HHPT rep Marylene Land accepted. Continue to monitor on 02.       Expected Discharge Plan: Home w Home Health Services Barriers to Discharge: Continued Medical Work up  Expected Discharge Plan and Services   Discharge Planning Services: CM Consult Post Acute Care Choice: Home Health Living arrangements for the past 2 months: Single Family Home                           HH Arranged: PT HH Agency: [facility] Date [facility] Agency Contacted: 05/04/23 Time HH Agency Contacted: 1257 Representative spoke with at [facility] Agency: Marylene Land   Social Determinants of Health (SDOH) Interventions SDOH Screenings   Food Insecurity: No Food Insecurity (05/01/2023)  Housing: Low Risk  (05/01/2023)  Transportation Needs: No Transportation Needs (05/01/2023)  Utilities: Not At Risk (05/01/2023)  Alcohol Screen: Low Risk  (04/30/2020)  Depression (PHQ2-9): Low Risk  (06/26/2020)  Financial Resource Strain: Low Risk  (08/23/2019)  Social Connections: Moderately Integrated (04/30/2023)  Tobacco Use: Medium Risk (05/03/2023)    Readmission Risk Interventions     No data to display

## 2023-05-04 NOTE — Plan of Care (Signed)
 No acute changes  Problem: Clinical Measurements: Goal: Ability to maintain clinical measurements within normal limits will improve Outcome: Progressing

## 2023-05-05 ENCOUNTER — Inpatient Hospital Stay (HOSPITAL_COMMUNITY)

## 2023-05-05 DIAGNOSIS — J189 Pneumonia, unspecified organism: Secondary | ICD-10-CM | POA: Diagnosis not present

## 2023-05-05 DIAGNOSIS — I700 Atherosclerosis of aorta: Secondary | ICD-10-CM | POA: Diagnosis not present

## 2023-05-05 DIAGNOSIS — I5021 Acute systolic (congestive) heart failure: Secondary | ICD-10-CM | POA: Diagnosis not present

## 2023-05-05 DIAGNOSIS — E876 Hypokalemia: Secondary | ICD-10-CM | POA: Diagnosis not present

## 2023-05-05 DIAGNOSIS — I5020 Unspecified systolic (congestive) heart failure: Secondary | ICD-10-CM

## 2023-05-05 DIAGNOSIS — R9389 Abnormal findings on diagnostic imaging of other specified body structures: Secondary | ICD-10-CM | POA: Diagnosis not present

## 2023-05-05 DIAGNOSIS — N179 Acute kidney failure, unspecified: Secondary | ICD-10-CM | POA: Diagnosis not present

## 2023-05-05 DIAGNOSIS — J9601 Acute respiratory failure with hypoxia: Secondary | ICD-10-CM | POA: Diagnosis not present

## 2023-05-05 LAB — BASIC METABOLIC PANEL WITH GFR
Anion gap: 11 (ref 5–15)
BUN: 45 mg/dL — ABNORMAL HIGH (ref 8–23)
CO2: 26 mmol/L (ref 22–32)
Calcium: 9.3 mg/dL (ref 8.9–10.3)
Chloride: 104 mmol/L (ref 98–111)
Creatinine, Ser: 1.88 mg/dL — ABNORMAL HIGH (ref 0.61–1.24)
GFR, Estimated: 34 mL/min — ABNORMAL LOW (ref >60)
Glucose, Bld: 150 mg/dL — ABNORMAL HIGH (ref 70–99)
Potassium: 3.2 mmol/L — ABNORMAL LOW (ref 3.5–5.1)
Sodium: 141 mmol/L (ref 135–145)

## 2023-05-05 LAB — CULTURE, BLOOD (ROUTINE X 2)
Culture: NO GROWTH
Special Requests: ADEQUATE

## 2023-05-05 MED ORDER — ALBUTEROL SULFATE (2.5 MG/3ML) 0.083% IN NEBU: 2.5000 mg | INHALATION_SOLUTION | RESPIRATORY_TRACT | Status: DC | PRN

## 2023-05-05 MED ORDER — ISOSORBIDE MONONITRATE ER 60 MG PO TB24
30.0000 mg | ORAL_TABLET | Freq: Every day | ORAL | Status: DC
  Administered 2023-05-05 – 2023-05-06 (×2): 30 mg via ORAL
  Filled 2023-05-05 (×2): qty 1

## 2023-05-05 MED ORDER — POTASSIUM CHLORIDE CRYS ER 20 MEQ PO TBCR
40.0000 meq | EXTENDED_RELEASE_TABLET | Freq: Once | ORAL | Status: AC
  Administered 2023-05-05: 40 meq via ORAL
  Filled 2023-05-05: qty 2

## 2023-05-05 MED ORDER — ASPIRIN 81 MG PO TBEC
81.0000 mg | DELAYED_RELEASE_TABLET | Freq: Every day | ORAL | Status: DC
  Administered 2023-05-06: 81 mg via ORAL
  Filled 2023-05-05: qty 1

## 2023-05-05 MED ORDER — ENOXAPARIN SODIUM 30 MG/0.3ML IJ SOSY
30.0000 mg | PREFILLED_SYRINGE | INTRAMUSCULAR | Status: DC
Start: 2023-05-05 — End: 2023-05-09
  Administered 2023-05-05 – 2023-05-08 (×4): 30 mg via SUBCUTANEOUS
  Filled 2023-05-05 (×4): qty 0.3

## 2023-05-05 MED ORDER — POTASSIUM CHLORIDE 10 MEQ/100ML IV SOLN
10.0000 meq | INTRAVENOUS | Status: DC
  Administered 2023-05-05: 10 meq via INTRAVENOUS
  Filled 2023-05-05: qty 100

## 2023-05-05 NOTE — Progress Notes (Signed)
 NAME:  Patrick Villa, MRN:  [id number], DOB:  [birth year], LOS: 4 ADMISSION DATE:  04/30/2023, CONSULTATION DATE:  4/8 REFERRING MD:  Trevor Mace, CHIEF COMPLAINT:  hypoxia   History of Present Illness:  Patrick Villa is an 89 y/o gentleman with a history of COPD, type 2 diabetes who presented with weakness, shortness of breath that are worse on the day of presentation but had been present for about 2 weeks.  In the emergency department he was saturating 87%.  Prior to presentation to the hospital he had been treated with doxycycline and Augmentin as an outpatient.  He was admitted for low saturations and failure of outpatient antibiotics.  Since admission he has been continued on azithromycin and ceftriaxone.  He was on 2 L nasal cannula from 4/5 until 4/7 when he jumped up to 7 L oxygen in the afternoon on 4/7. Today he feels ok; overall he reports feeling improved since admission. His nurse is concerning with how quickly his oxygen needs have risen in the past day. He denies reflux/ GERD symptoms and denies aspiration when eating/ drinking.  No family history of lung disease. He did not know he had COPD and is not on inhalers PTA. He quit smoking in the 80s after ~1ppd x ~40 years. He worked as a Technical sales engineer.   Pertinent  Medical History  HLD HTN DM COPD  Significant Hospital Events: Including procedures, antibiotic start and stop dates in addition to other pertinent events   4/5 admitted for pneumonia, started ceftriaxone and azithromycin 4/7 increasing oxygen requirements, started steroids after CT showed diffuse GG. Antibiotics escalated to cover HAP.  Interim History / Subjective:  Patrick Villa denies complaints.  Objective   Blood pressure (!) 149/67, pulse 91, temperature 99.1 F (37.3 C), temperature source Axillary, resp. rate 15, height 5\' 6"  (1.676 m), weight 83.5 kg, SpO2 91%.        Intake/Output Summary (Last 24 hours) at 05/05/2023 0747 Last data filed at 05/05/2023 0457 Gross per  24 hour  Intake 360 ml  Output 700 ml  Net -340 ml   Filed Weights   04/30/23 1234 04/30/23 1813  Weight: 83.5 kg 83.5 kg    Examination: General: frail appearing elderly man lying in bed in NAD HENT: [place]/AT, eyes anicteric Lungs: breathing comfortably on 11L [place], no rhales, faint rhonchi. Cardiovascular: S1S2, RRR Abdomen: soft, NT Extremities: no edema Neuro: awake & alert, moving extremities, answering questions  CXR personally reviewed> R>L still has diffuse mild opacities  BUN 45 Cr 1.88 COVID, flu, RSV negative RVP negative  BNP 158 ESR 83 CRP 18.4 Strep pneumo negative   Resolved Hospital Problem list     Assessment & Plan:  Acute respiratory failure with hypoxia With progression on CT and diffuse groundglass worry about BOOP.  Diuresis his renal function bumped significantly and he does not have significant peripheral edema on exam making acute pulmonary edema less likely as a cause for his worsening. -no need for additional diuresis -con't broad antibiotics- zosyn. Collect sputum culture if able. -con't solumedrol 125mg  daily  -wean oxygen as able to maintain SpO2 90-04% -pulmonary hygiene; needs more OOB mobility. IS at bedside.   Hypokalemia -getting scheduled oral repletion, adding IV repletion  New HFrEF  -con't management per cardiology  Rest of care per primary.   Best Practice (right click and "Reselect all SmartList Selections" daily)   Per primary  Labs   CBC: Recent Labs  Lab 04/30/23 1350 05/01/23 0515 05/03/23 0525 05/04/23 4098  WBC 6.5 6.3 6.8 10.4  NEUTROABS 5.2 4.6  --   --   HGB 10.9* 10.5* 11.9* 11.5*  HCT 34.0* 34.1* 39.4 36.0*  MCV 83.1 87.4 86.8 85.1  PLT 270 232 293 277    Basic Metabolic Panel: Recent Labs  Lab 05/01/23 0515 05/02/23 0446 05/03/23 0525 05/04/23 0447 05/05/23 0450  NA 140 141 137 136 141  K 3.1* 2.9* 3.3* 3.0* 3.2*  CL 105 100 101 96* 104  CO2 24 26 25 27 26   GLUCOSE 96 122* 125* 155*  150*  BUN 21 25* 28* 37* 45*  CREATININE 1.18 1.51* 1.38* 1.78* 1.88*  CALCIUM 9.0 9.6 9.3 9.1 9.3  MG 1.7  --  2.3  --   --    GFR: Estimated Creatinine Clearance: 28.1 mL/min (A) (by C-G formula based on SCr of 1.88 mg/dL (H)). Recent Labs  Lab 04/30/23 1350 04/30/23 1537 05/01/23 0515 05/03/23 0525 05/04/23 0447  WBC 6.5  --  6.3 6.8 10.4  LATICACIDVEN 1.5 1.0  --   --   --     Liver Function Tests: Recent Labs  Lab 04/30/23 1350 05/01/23 0515  AST 50* 61*  ALT 29 35  ALKPHOS 52 49  BILITOT 0.4 0.5  PROT 7.0 6.2*  ALBUMIN 3.4* 2.4*     Critical care time: n/a       Steffanie Dunn, DO 05/05/23 10:49 AM [facility]  For contact information, see Amion. If no response to pager, please call PCCM consult pager. After hours, 7PM- 7AM, please call Elink.

## 2023-05-05 NOTE — Progress Notes (Addendum)
 PROGRESS NOTE  Patrick Villa  UUV:[id number] DOB: 14-Oct-1933 DOA: 04/30/2023 PCP: Ollen Bowl, MD   Brief Narrative: Patient is a 89 year old male with history of COPD, diabetes mellitus, hyperlipidemia, hypertension, BPH who presented to the device department with complaint of generalized weakness, shortness of breath, nonproductive cough, fever.  On presentation,he was hypoxic at 87%.  He was recently treated for multifocal pneumonia with Augmentin and doxycycline as an outpatient but continued to have symptoms.  On presentation, he was febrile up to one 101 .56F, saturating 89% on room air, potassium 3.1, creatinine 1.5.  Chest x-ray showed patchy airspace opacity with bilateral pleural effusion, possible mild pulmonary edema.  Patient admitted for further management of multifocal pneumonia not responding to outpatient antibiotic therapy.  Also suspected to have volume overload, started on IV Lasix.  Echo showed EF of 40 to 45%, cardiology also consulted.  Hospital course remarkable for worsening respiratory status, now on HF oxygen.  PCCM also consulted.  Suspicion for BOOP, plan is to continue antibiotics.  Assessment & Plan:  Principal Problem:   Multifocal pneumonia Active Problems:   Essential hypertension   COPD (chronic obstructive pulmonary disease) (HCC)   Hyperlipidemia   BPH (benign prostatic hyperplasia)   Hypokalemia   AKI (acute kidney injury) (HCC)   Hypoxia   Acute systolic heart failure (HCC)   Aortic atherosclerosis (HCC)   Coronary artery calcification   Multifocal pneumonia/community-acquired pneumonia/suspicion for BOOP/acute hypoxic respiratory failure:He was recently treated for multifocal pneumonia with Augmentin and doxycycline as an outpatient but continued to have symptoms.  Chest x-ray showed patchy airspace opacity with suspicion of bilateral pleural effusion, possible mild pulmonary edema.  Started on broad-spectrum antibiotics.  Lactic acid was normal on  presentation.  Follow-up cultures:NGTD.  No leukocytosis or fever this morning.  Chest x-ray follow-up on 4/8  showed right sided pneumonia.  PCCM consulted due to worsening respiratory status.  CT chest showed worsening multifocal pneumonia.  Suspicion for BOOP.Antibiotics changed to  Zosyn.  Will also continue doxycycline because of history of recent tick bites. Negative streptococcal pneumoniae antigen.  Also started on steroids. Will continue to attempt to wean the oxygen.  Acute HFrEF: Does not have peripheral edema but had bilateral crackles.  Chest imaging suggestive of features of pulmonary edema.  Elevated BNP as per 4/1.  Given  2 doses of Lasix IV 40 mg on 4/6.  Echo showed EF of 40 to 45%, global hypokinesis, grade 1 diastolic dysfunction.  Cardiology consulted, plan was  for a right/left heart cath but now deferred because of worsening respiratory status.  Cardiology will follow him up as an outpatient and plan for cath if necessary. Lasix on hold because creatinine trended up  COPD: Not on on oxygen at home. He is a past smoker,No wheezing  AKI: Creatinine trended up to 1.8.  Baseline creatinine normal.  Will continue to hold Lasix for now.   Hypokalemia: Supplemented with potassium  Hypertension: On imdur, hydralazine.Continue PRN medication for severe hypertension. on doxazosine  KVQ:QVZDGLOV doxazosin,proscar  Debility/deconditioning: Patient lives alone.  Ambulates well.  He daughter lives just a few minutes away.   PT/OT recommend home health on discharge          DVT prophylaxis:enoxaparin (LOVENOX) injection 30 mg Start: 05/05/23 2200     Code Status: Full Code  Family Communication:  discussed with daughter Eber Jones at bedside on 4/10.Called daughter twice today, calls not  received  Patient status:Inpatient  Patient is from :Home  Anticipated discharge  ZO:XWRU  Estimated DC date: not sure, currently on high flow oxygen   Consultants: Cardiology,  PCCM  Procedures:None  Antimicrobials:  Anti-infectives (From admission, onward)    Start     Dose/Rate Route Frequency Ordered Stop   05/04/23 1200  vancomycin (VANCOCIN) IVPB 1000 mg/200 mL premix  Status:  Discontinued        1,000 mg 200 mL/hr over 60 Minutes Intravenous Every 24 hours 05/03/23 0953 05/04/23 0916   05/03/23 1130  doxycycline (VIBRA-TABS) tablet 100 mg        100 mg Oral Every 12 hours 05/03/23 1043     05/03/23 1030  piperacillin-tazobactam (ZOSYN) IVPB 3.375 g        3.375 g 12.5 mL/hr over 240 Minutes Intravenous Every 8 hours 05/03/23 0943     05/03/23 1030  vancomycin (VANCOREADY) IVPB 1500 mg/300 mL        1,500 mg 150 mL/hr over 120 Minutes Intravenous  Once 05/03/23 0943 05/04/23 1106   05/01/23 1000  cefTRIAXone (ROCEPHIN) 2 g in sodium chloride 0.9 % 100 mL IVPB  Status:  Discontinued        2 g 200 mL/hr over 30 Minutes Intravenous Every 24 hours 04/30/23 1844 05/03/23 0940   05/01/23 1000  azithromycin (ZITHROMAX) 500 mg in sodium chloride 0.9 % 250 mL IVPB  Status:  Discontinued        500 mg 250 mL/hr over 60 Minutes Intravenous Every 24 hours 04/30/23 1844 05/03/23 1043   04/30/23 1345  cefTRIAXone (ROCEPHIN) 2 g in sodium chloride 0.9 % 100 mL IVPB        2 g 200 mL/hr over 30 Minutes Intravenous Once 04/30/23 1332 04/30/23 1455   04/30/23 1345  azithromycin (ZITHROMAX) 500 mg in sodium chloride 0.9 % 250 mL IVPB        500 mg 250 mL/hr over 60 Minutes Intravenous  Once 04/30/23 1332 04/30/23 1534       Subjective: Patient seen and examined at the bedside today.  Hemodynamically stable.  Lying in bed.  He was on high flow oxygen at 8 L/min.  As always, he denies any worsening shortness of breath and states he is okay.  Does not look in respiratory distress but appears weak  Objective: Vitals:   05/04/23 2243 05/04/23 2255 05/05/23 0454 05/05/23 0751  BP: (!) 145/70 (!) 132/100 (!) 149/67   Pulse:  (!) 106 91   Resp:  15 15   Temp:  97.6  F (36.4 C) 99.1 F (37.3 C)   TempSrc:  Oral Axillary   SpO2:  99% 91% (S) (!) 85%  Weight:      Height:        Intake/Output Summary (Last 24 hours) at 05/05/2023 1134 Last data filed at 05/05/2023 0457 Gross per 24 hour  Intake 120 ml  Output 700 ml  Net -580 ml   Filed Weights   04/30/23 1234 04/30/23 1813  Weight: 83.5 kg 83.5 kg    Examination:   General exam: Overall comfortable, not in distress, very pleasant elderly male HEENT: PERRL Respiratory system: Bilateral rhonchi, crackles  Cardiovascular system: S1 & S2 heard, RRR.  Gastrointestinal system: Abdomen is nondistended, soft and nontender. Central nervous system: Alert and oriented Extremities: No edema, no clubbing ,no cyanosis Skin: No rashes, no ulcers,no icterus    Data Reviewed: I have personally reviewed following labs and imaging studies  CBC: Recent Labs  Lab 04/30/23 1350 05/01/23 0515 05/03/23 0525 05/04/23 0447  WBC  6.5 6.3 6.8 10.4  NEUTROABS 5.2 4.6  --   --   HGB 10.9* 10.5* 11.9* 11.5*  HCT 34.0* 34.1* 39.4 36.0*  MCV 83.1 87.4 86.8 85.1  PLT 270 232 293 277   Basic Metabolic Panel: Recent Labs  Lab 05/01/23 0515 05/02/23 0446 05/03/23 0525 05/04/23 0447 05/05/23 0450  NA 140 141 137 136 141  K 3.1* 2.9* 3.3* 3.0* 3.2*  CL 105 100 101 96* 104  CO2 24 26 25 27 26   GLUCOSE 96 122* 125* 155* 150*  BUN 21 25* 28* 37* 45*  CREATININE 1.18 1.51* 1.38* 1.78* 1.88*  CALCIUM 9.0 9.6 9.3 9.1 9.3  MG 1.7  --  2.3  --   --      Recent Results (from the past 240 hours)  Resp panel by RT-PCR (RSV, Flu A&B, Covid) Anterior Nasal Swab     Status: None   Collection Time: 04/26/23  1:23 PM   Specimen: Anterior Nasal Swab  Result Value Ref Range Status   SARS Coronavirus 2 by RT PCR NEGATIVE NEGATIVE Final    Comment: (NOTE) SARS-CoV-2 target nucleic acids are NOT DETECTED.  The SARS-CoV-2 RNA is generally detectable in upper respiratory specimens during the acute phase of  infection. The lowest concentration of SARS-CoV-2 viral copies this assay can detect is 138 copies/mL. A negative result does not preclude SARS-Cov-2 infection and should not be used as the sole basis for treatment or other patient management decisions. A negative result may occur with  improper specimen collection/handling, submission of specimen other than nasopharyngeal swab, presence of viral mutation(s) within the areas targeted by this assay, and inadequate number of viral copies(<138 copies/mL). A negative result must be combined with clinical observations, patient history, and epidemiological information. The expected result is Negative.  Fact Sheet for Patients:  BloggerCourse.com  Fact Sheet for Healthcare Providers:  SeriousBroker.it  This test is no t yet approved or cleared by the [place] FDA and  has been authorized for detection and/or diagnosis of SARS-CoV-2 by FDA under an Emergency Use Authorization (EUA). This EUA will remain  in effect (meaning this test can be used) for the duration of the COVID-19 declaration under Section 564(b)(1) of the Act, 21 U.S.C.section 360bbb-3(b)(1), unless the authorization is terminated  or revoked sooner.       Influenza A by PCR NEGATIVE NEGATIVE Final   Influenza B by PCR NEGATIVE NEGATIVE Final    Comment: (NOTE) The Xpert Xpress SARS-CoV-2/FLU/RSV plus assay is intended as an aid in the diagnosis of influenza from Nasopharyngeal swab specimens and should not be used as a sole basis for treatment. Nasal washings and aspirates are unacceptable for Xpert Xpress SARS-CoV-2/FLU/RSV testing.  Fact Sheet for Patients: BloggerCourse.com  Fact Sheet for Healthcare Providers: SeriousBroker.it  This test is not yet approved or cleared by the [place] FDA and has been authorized for detection and/or diagnosis of SARS-CoV-2  by FDA under an Emergency Use Authorization (EUA). This EUA will remain in effect (meaning this test can be used) for the duration of the COVID-19 declaration under Section 564(b)(1) of the Act, 21 U.S.C. section 360bbb-3(b)(1), unless the authorization is terminated or revoked.     Resp Syncytial Virus by PCR NEGATIVE NEGATIVE Final    Comment: (NOTE) Fact Sheet for Patients: BloggerCourse.com  Fact Sheet for Healthcare Providers: SeriousBroker.it  This test is not yet approved or cleared by the [place] FDA and has been authorized for detection and/or diagnosis of SARS-CoV-2 by  FDA under an Emergency Use Authorization (EUA). This EUA will remain in effect (meaning this test can be used) for the duration of the COVID-19 declaration under Section 564(b)(1) of the Act, 21 U.S.C. section 360bbb-3(b)(1), unless the authorization is terminated or revoked.  Performed at [facility], [address] [place], [place] [address]   Blood Culture (routine x 2)     Status: None   Collection Time: 04/30/23  1:40 PM   Specimen: BLOOD  Result Value Ref Range Status   Specimen Description   Final    BLOOD RIGHT ANTECUBITAL Performed at [facility], [address], [place], [place] [address]    Special Requests   Final    BOTTLES DRAWN AEROBIC AND ANAEROBIC Blood Culture results may not be optimal due to an excessive volume of blood received in culture bottles Performed at [facility], [address], [place], [place] [address]    Culture   Final    NO GROWTH 5 DAYS Performed at [facility], [address], [place], [place] [address]    Report Status 05/05/2023 FINAL  Final  Resp panel by RT-PCR (RSV, Flu A&B, Covid) Anterior Nasal Swab     Status: None   Collection Time: 04/30/23  1:50 PM   Specimen: Anterior Nasal Swab  Result Value Ref Range Status   SARS Coronavirus 2 by RT  PCR NEGATIVE NEGATIVE Final    Comment: (NOTE) SARS-CoV-2 target nucleic acids are NOT DETECTED.  The SARS-CoV-2 RNA is generally detectable in upper respiratory specimens during the acute phase of infection. The lowest concentration of SARS-CoV-2 viral copies this assay can detect is 138 copies/mL. A negative result does not preclude SARS-Cov-2 infection and should not be used as the sole basis for treatment or other patient management decisions. A negative result may occur with  improper specimen collection/handling, submission of specimen other than nasopharyngeal swab, presence of viral mutation(s) within the areas targeted by this assay, and inadequate number of viral copies(<138 copies/mL). A negative result must be combined with clinical observations, patient history, and epidemiological information. The expected result is Negative.  Fact Sheet for Patients:  BloggerCourse.com  Fact Sheet for Healthcare Providers:  SeriousBroker.it  This test is no t yet approved or cleared by the [place] FDA and  has been authorized for detection and/or diagnosis of SARS-CoV-2 by FDA under an Emergency Use Authorization (EUA). This EUA will remain  in effect (meaning this test can be used) for the duration of the COVID-19 declaration under Section 564(b)(1) of the Act, 21 U.S.C.section 360bbb-3(b)(1), unless the authorization is terminated  or revoked sooner.       Influenza A by PCR NEGATIVE NEGATIVE Final   Influenza B by PCR NEGATIVE NEGATIVE Final    Comment: (NOTE) The Xpert Xpress SARS-CoV-2/FLU/RSV plus assay is intended as an aid in the diagnosis of influenza from Nasopharyngeal swab specimens and should not be used as a sole basis for treatment. Nasal washings and aspirates are unacceptable for Xpert Xpress SARS-CoV-2/FLU/RSV testing.  Fact Sheet for Patients: BloggerCourse.com  Fact Sheet for  Healthcare Providers: SeriousBroker.it  This test is not yet approved or cleared by the [place] FDA and has been authorized for detection and/or diagnosis of SARS-CoV-2 by FDA under an Emergency Use Authorization (EUA). This EUA will remain in effect (meaning this test can be used) for the duration of the COVID-19 declaration under Section 564(b)(1) of the Act, 21 U.S.C. section 360bbb-3(b)(1), unless the authorization  is terminated or revoked.     Resp Syncytial Virus by PCR NEGATIVE NEGATIVE Final    Comment: (NOTE) Fact Sheet for Patients: BloggerCourse.com  Fact Sheet for Healthcare Providers: SeriousBroker.it  This test is not yet approved or cleared by the [place] FDA and has been authorized for detection and/or diagnosis of SARS-CoV-2 by FDA under an Emergency Use Authorization (EUA). This EUA will remain in effect (meaning this test can be used) for the duration of the COVID-19 declaration under Section 564(b)(1) of the Act, 21 U.S.C. section 360bbb-3(b)(1), unless the authorization is terminated or revoked.  Performed at [facility], [address] [place], [place] [address]   Blood Culture (routine x 2)     Status: None   Collection Time: 04/30/23  2:05 PM   Specimen: BLOOD LEFT FOREARM  Result Value Ref Range Status   Specimen Description   Final    BLOOD LEFT FOREARM Performed at [facility], [address], [place], [place] [address]    Special Requests   Final    BOTTLES DRAWN AEROBIC AND ANAEROBIC Blood Culture adequate volume Performed at [facility], [address] [place], [place] [address]    Culture   Final    NO GROWTH 5 DAYS Performed at [facility], [address], [place], [place] [address]    Report Status 05/05/2023 FINAL  Final  MRSA Next Gen by PCR, Nasal     Status: None   Collection  Time: 05/03/23  1:01 PM   Specimen: Nasal Mucosa; Nasal Swab  Result Value Ref Range Status   MRSA by PCR Next Gen NOT DETECTED NOT DETECTED Final    Comment: (NOTE) The GeneXpert MRSA Assay (FDA approved for NASAL specimens only), is one component of a comprehensive MRSA colonization surveillance program. It is not intended to diagnose MRSA infection nor to guide or monitor treatment for MRSA infections. Test performance is not FDA approved in patients less than 51 years old. Performed at [facility], [address], [place], [place] [address]   Respiratory (~20 pathogens) panel by PCR     Status: None   Collection Time: 05/03/23  3:30 PM   Specimen: Nasopharyngeal Swab; Respiratory  Result Value Ref Range Status   Adenovirus NOT DETECTED NOT DETECTED Final   Coronavirus 229E NOT DETECTED NOT DETECTED Final    Comment: (NOTE) The Coronavirus on the Respiratory Panel, DOES NOT test for the novel  Coronavirus (2019 nCoV)    Coronavirus HKU1 NOT DETECTED NOT DETECTED Final   Coronavirus NL63 NOT DETECTED NOT DETECTED Final   Coronavirus OC43 NOT DETECTED NOT DETECTED Final   Metapneumovirus NOT DETECTED NOT DETECTED Final   Rhinovirus / Enterovirus NOT DETECTED NOT DETECTED Final   Influenza A NOT DETECTED NOT DETECTED Final   Influenza B NOT DETECTED NOT DETECTED Final   Parainfluenza Virus 1 NOT DETECTED NOT DETECTED Final   Parainfluenza Virus 2 NOT DETECTED NOT DETECTED Final   Parainfluenza Virus 3 NOT DETECTED NOT DETECTED Final   Parainfluenza Virus 4 NOT DETECTED NOT DETECTED Final   Respiratory Syncytial Virus NOT DETECTED NOT DETECTED Final   Bordetella pertussis NOT DETECTED NOT DETECTED Final   Bordetella Parapertussis NOT DETECTED NOT DETECTED Final   Chlamydophila pneumoniae NOT DETECTED NOT DETECTED Final   Mycoplasma pneumoniae NOT DETECTED NOT DETECTED Final    Comment: Performed at [facility], [address] [place], [place]  [address]     Radiology  Studies: DG CHEST PORT 1 VIEW Result Date: 05/05/2023 CLINICAL DATA:  Hypoxia. EXAM: PORTABLE CHEST 1 VIEW COMPARISON:  May 03, 2023. FINDINGS: Stable cardiomediastinal silhouette. Continued right lung opacities are noted concerning for pneumonia. Left lung is unremarkable. Bony thorax is unremarkable. IMPRESSION: Continued right lung opacities are noted concerning for pneumonia. Electronically Signed   By: Lupita Raider M.D.   On: 05/05/2023 10:53   CT CHEST WO CONTRAST Result Date: 05/04/2023 CLINICAL DATA:  Respiratory illness shortness of breath. EXAM: CT CHEST WITHOUT CONTRAST TECHNIQUE: Multidetector CT imaging of the chest was performed following the standard protocol without IV contrast. RADIATION DOSE REDUCTION: This exam was performed according to the departmental dose-optimization program which includes automated exposure control, adjustment of the mA and/or kV according to patient size and/or use of iterative reconstruction technique. COMPARISON:  04/26/2023. FINDINGS: Cardiovascular: Atherosclerotic calcification of the aorta, aortic valve and coronary arteries. Enlarged pulmonic trunk and heart. No pericardial effusion. Mediastinum/Nodes: Mediastinal lymph nodes measure up to 1.5 cm in the low right paratracheal station, as before. Hilar regions are difficult to definitively evaluate without IV contrast. No axillary adenopathy. Air in the esophagus can be seen with dysmotility. Lungs/Pleura: Increasing patchy ground-glass in the lungs bilaterally with some consolidation in the dependent right lower lobe. Trace bilateral pleural effusions. Airway is unremarkable. Upper Abdomen: Visualized portions of the liver, gallbladder, adrenal glands, kidneys, spleen, pancreas, stomach and bowel are grossly unremarkable. No upper abdominal adenopathy. Musculoskeletal: Degenerative changes in the spine. IMPRESSION: 1. Worsening multi lobar pneumonia. 2. Mediastinal adenopathy, likely  reactive in etiology. 3. Trace bilateral pleural effusions. 4. Aortic atherosclerosis (ICD10-I70.0). Coronary artery calcification. 5. Enlarged pulmonic trunk, indicative of pulmonary arterial hypertension. Electronically Signed   By: Leanna Battles M.D.   On: 05/04/2023 15:55    Scheduled Meds:  aspirin  325 mg Oral Daily   cholecalciferol  2,000 Units Oral Daily   doxazosin  2 mg Oral Q12H   doxycycline  100 mg Oral Q12H   enoxaparin (LOVENOX) injection  30 mg Subcutaneous Q24H   finasteride  5 mg Oral Daily   hydrALAZINE  50 mg Oral TID   ipratropium-albuterol  3 mL Nebulization TID   isosorbide mononitrate  30 mg Oral Daily   methylPREDNISolone (SOLU-MEDROL) injection  125 mg Intravenous Daily   potassium chloride  40 mEq Oral BID   Continuous Infusions:  piperacillin-tazobactam (ZOSYN)  IV 3.375 g (05/05/23 1118)   potassium chloride       LOS: 4 days   Burnadette Pop, MD [facility] P4/10/2023, 11:34 AM

## 2023-05-05 NOTE — Progress Notes (Signed)
 Occupational Therapy Treatment Patient Details Name: Patrick Villa MRN: [id number] DOB: [birth year] Today's Date: 05/05/2023   History of present illness 89 yr old male admitted with PNA, acute heart failure, and suspicion for acute respiratory failure.Marland Kitchen Hx of COPD, DM, BPH   OT comments  The pt was noted to be on 11L O2 via HFNC. He performed supine to it with SBA. His O2 saturation was noted to decreased to 83% on 11L, however improved to 89% on 12L. One sit to stand transfer was implemented for which he required CGA, though his O2 was noted to decrease to 79% on 12L. Further progressive activity was deferred and he was assisted back to bed. OT provided further reinforcement on deep breathing and activity simplification strategies. Continue OT plan of care.       If plan is discharge home, recommend the following:  Assist for transportation;Assistance with cooking/housework;Help with stairs or ramp for entrance;A little help with walking and/or transfers;A lot of help with bathing/dressing/bathroom   Equipment Recommendations  Other (comment)    Recommendations for Other Services      Precautions / Restrictions Precautions Precaution/Restrictions Comments: monitor O2-now on HFNC Restrictions Weight Bearing Restrictions Per Provider Order: No       Mobility Bed Mobility Overal bed mobility: Needs Assistance Bed Mobility: Supine to Sit, Sit to Supine     Supine to sit: Supervision, HOB elevated, Used rails Sit to supine: Supervision, HOB elevated, Used rails        Transfers Overall transfer level: Needs assistance Equipment used: None Transfers: Sit to/from Stand Sit to Stand: Contact guard assist                 Balance       Sitting balance - Comments: static sitting-good. dynamic sitting-fair+     Standing balance-Leahy Scale: Fair              ADL either performed or assessed with clinical judgement                  Cognition Arousal:  Alert Behavior During Therapy: WFL for tasks assessed/performed Cognition: No apparent impairments          Following commands: Intact                      Pertinent Vitals/ Pain       Pain Assessment Pain Assessment: No/denies pain   Frequency  Min 2X/week        Progress Toward Goals  OT Goals(current goals can now be found in the care plan section)     Acute Rehab OT Goals Patient Stated Goal: to return to prior level of functioning OT Goal Formulation: With patient Time For Goal Achievement: 05/17/23 Potential to Achieve Goals: Good  Plan         AM-PAC OT "6 Clicks" Daily Activity     Outcome Measure   Help from another person eating meals?: None Help from another person taking care of personal grooming?: A Little Help from another person toileting, which includes using toliet, bedpan, or urinal?: A Little Help from another person bathing (including washing, rinsing, drying)?: A Lot Help from another person to put on and taking off regular upper body clothing?: A Little Help from another person to put on and taking off regular lower body clothing?: A Little 6 Click Score: 18    End of Session Equipment Utilized During Treatment: Oxygen  OT Visit Diagnosis: Muscle weakness (generalized) (M62.81)  Activity Tolerance Other (comment) (Limited by compromised activity tolerance and shortness of breath)   Patient Left in bed;with call bell/phone within reach;with bed alarm set   Nurse Communication Other (comment) (O2 saturation with activity)        Time: 1610-9604 OT Time Calculation (min): 18 min  Charges: OT General Charges $OT Visit: 1 Visit OT Treatments $Therapeutic Activity: 8-22 mins     Reuben Likes, OTR/L 05/05/2023, 3:37 PM

## 2023-05-05 NOTE — Progress Notes (Signed)
 Rounding Note    Patient Name: Patrick Villa Date of Encounter: 05/05/2023  [facility] Cardiologist: Jodelle Red, MD   Subjective   Patient states he continue to feel better. He feels like his breathing continues to slowly improve. However, he is on more O2 today. He is currently requiring 10L of HFNC. He denies any chest pain. No cardiac complaints this morning.  Inpatient Medications    Scheduled Meds:  aspirin  325 mg Oral Daily   chlorthalidone  25 mg Oral Daily   cholecalciferol  2,000 Units Oral Daily   doxazosin  2 mg Oral Q12H   doxycycline  100 mg Oral Q12H   enoxaparin (LOVENOX) injection  40 mg Subcutaneous Q24H   finasteride  5 mg Oral Daily   hydrALAZINE  50 mg Oral TID   ipratropium-albuterol  3 mL Nebulization TID   methylPREDNISolone (SOLU-MEDROL) injection  125 mg Intravenous Daily   potassium chloride  40 mEq Oral BID   Continuous Infusions:  piperacillin-tazobactam (ZOSYN)  IV 3.375 g (05/05/23 0158)   PRN Meds: acetaminophen, guaiFENesin-dextromethorphan, labetalol, mouth rinse   Vital Signs    Vitals:   05/04/23 2243 05/04/23 2255 05/05/23 0454 05/05/23 0751  BP: (!) 145/70 (!) 132/100 (!) 149/67   Pulse:  (!) 106 91   Resp:  15 15   Temp:  97.6 F (36.4 C) 99.1 F (37.3 C)   TempSrc:  Oral Axillary   SpO2:  99% 91% (S) (!) 85%  Weight:      Height:        Intake/Output Summary (Last 24 hours) at 05/05/2023 0950 Last data filed at 05/05/2023 0457 Gross per 24 hour  Intake 120 ml  Output 700 ml  Net -580 ml      04/30/2023    6:13 PM 04/30/2023   12:34 PM 04/26/2023   12:46 PM  Last 3 Weights  Weight (lbs) 184 lb 1.4 oz 184 lb 1.4 oz 184 lb  Weight (kg) 83.5 kg 83.5 kg 83.462 kg      Telemetry    Sinus rhythm with rates in the 80s to low 100s (currently in the 90s). Occasional PVC.  - Personally Reviewed  ECG    No new ECG tracing since 04/30/2023. - Personally Reviewed  Physical Exam   GEN: 89 year old  African-American male. No acute distress.  10L of O2 via HFNC. Neck: No JVD. Cardiac: RRR. No murmurs, rubs, or gallops.  Respiratory: No increased work of breathing. Crackles noted in bilateral bases.  MS: No lower extremity edema. No deformity. Skin: Warm and dry. Neuro:  No focal deficits. Psych: Normal affect. Responds appropriately.   Labs    High Sensitivity Troponin:   Recent Labs  Lab 04/26/23 1323 04/26/23 1511  TROPONINIHS 67* 75*     Chemistry Recent Labs  Lab 04/30/23 1350 05/01/23 0515 05/02/23 0446 05/03/23 0525 05/04/23 0447 05/05/23 0450  NA 138 140   < > 137 136 141  K 3.1* 3.1*   < > 3.3* 3.0* 3.2*  CL 101 105   < > 101 96* 104  CO2 26 24   < > 25 27 26   GLUCOSE 110* 96   < > 125* 155* 150*  BUN 24* 21   < > 28* 37* 45*  CREATININE 1.52* 1.18   < > 1.38* 1.78* 1.88*  CALCIUM 9.9 9.0   < > 9.3 9.1 9.3  MG  --  1.7  --  2.3  --   --  PROT 7.0 6.2*  --   --   --   --   ALBUMIN 3.4* 2.4*  --   --   --   --   AST 50* 61*  --   --   --   --   ALT 29 35  --   --   --   --   ALKPHOS 52 49  --   --   --   --   BILITOT 0.4 0.5  --   --   --   --   GFRNONAA 44* 60*   < > 49* 36* 34*  ANIONGAP 11 11   < > 11 13 11    < > = values in this interval not displayed.    Lipids No results for input(s): "CHOL", "TRIG", "HDL", "LABVLDL", "LDLCALC", "CHOLHDL" in the last 168 hours.  Hematology Recent Labs  Lab 05/01/23 0515 05/03/23 0525 05/04/23 0447  WBC 6.3 6.8 10.4  RBC 3.90* 4.54 4.23  HGB 10.5* 11.9* 11.5*  HCT 34.1* 39.4 36.0*  MCV 87.4 86.8 85.1  MCH 26.9 26.2 27.2  MCHC 30.8 30.2 31.9  RDW 13.4 13.6 13.5  PLT 232 293 277   Thyroid No results for input(s): "TSH", "FREET4" in the last 168 hours.  BNP Recent Labs  Lab 05/03/23 0532  BNP 157.6*    DDimer No results for input(s): "DDIMER" in the last 168 hours.   Radiology    CT CHEST WO CONTRAST Result Date: 05/04/2023 CLINICAL DATA:  Respiratory illness shortness of breath. EXAM: CT CHEST  WITHOUT CONTRAST TECHNIQUE: Multidetector CT imaging of the chest was performed following the standard protocol without IV contrast. RADIATION DOSE REDUCTION: This exam was performed according to the departmental dose-optimization program which includes automated exposure control, adjustment of the mA and/or kV according to patient size and/or use of iterative reconstruction technique. COMPARISON:  04/26/2023. FINDINGS: Cardiovascular: Atherosclerotic calcification of the aorta, aortic valve and coronary arteries. Enlarged pulmonic trunk and heart. No pericardial effusion. Mediastinum/Nodes: Mediastinal lymph nodes measure up to 1.5 cm in the low right paratracheal station, as before. Hilar regions are difficult to definitively evaluate without IV contrast. No axillary adenopathy. Air in the esophagus can be seen with dysmotility. Lungs/Pleura: Increasing patchy ground-glass in the lungs bilaterally with some consolidation in the dependent right lower lobe. Trace bilateral pleural effusions. Airway is unremarkable. Upper Abdomen: Visualized portions of the liver, gallbladder, adrenal glands, kidneys, spleen, pancreas, stomach and bowel are grossly unremarkable. No upper abdominal adenopathy. Musculoskeletal: Degenerative changes in the spine. IMPRESSION: 1. Worsening multi lobar pneumonia. 2. Mediastinal adenopathy, likely reactive in etiology. 3. Trace bilateral pleural effusions. 4. Aortic atherosclerosis (ICD10-I70.0). Coronary artery calcification. 5. Enlarged pulmonic trunk, indicative of pulmonary arterial hypertension. Electronically Signed   By: Leanna Battles M.D.   On: 05/04/2023 15:55    Cardiac Studies   Echocardiogram 05/01/2023: Impressions:  1. Left ventricular ejection fraction, by estimation, is 40 to 45%. The  left ventricle has mildly decreased function. The left ventricle  demonstrates global hypokinesis. There is mild left ventricular  hypertrophy. Left ventricular diastolic parameters   are consistent with Grade I diastolic dysfunction (impaired relaxation).   2. Right ventricular systolic function is normal. The right ventricular  size is normal.   3. Left atrial size was moderately dilated.   4. The mitral valve is normal in structure. No evidence of mitral valve  regurgitation. No evidence of mitral stenosis.   5. The aortic valve is calcified. There is moderate  calcification of the  aortic valve. There is mild thickening of the aortic valve. Aortic valve  regurgitation is trivial. Aortic valve sclerosis is present, with no  evidence of aortic valve stenosis.  Aortic valve mean gradient measures 7.0 mmHg. Aortic valve Vmax measures  1.78 m/s.   6. The inferior vena cava is normal in size with greater than 50%  respiratory variability, suggesting right atrial pressure of 3 mmHg.    Patient Profile     89 y.o. 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 However, he was also noted to be volume overloaded. Echo showed mildly reduced EF of 40-45% with global hypokinesis. Cardiology was consulted for further evaluation of CHF.  Assessment & Plan    Acute on Chronic HFmrEF Patient was admitted with acute hypoxic respiratory failure secondary to multifocal pneumonia. However, he was also felt to have a component of CHF. BNP 157.6 Chest x-ray and CT were concerning for pneumonia. Echo showed LVEF of 40-45% with mild LVH and grade 1 diastolic dysfunction. He initially was treated with IV Lasix but had worsening renal function with this. Last dose of Lasix was 4/8. Net negative 1.4 L. Creatinine continues to slowly rise. No updated weights since admission. - He is requiring more O2 today and he has bibasilar crackles on exam. However, no edema. Suspect this is due to his pneumonia not  CHF. Repeat chest x-ray pending.  - Would hold off on additional IV Lasix given creatinine continues to rise. - Has not tolerate Spironolactone in the past due to chest wall tenderness.  Eplerenone was tried but renal function worsened with this as well so this was stopped on 4/9. Can resume when renal function returns to baseline.  - Will stop home Chlorthalidone given renal function. Given reduced EF, would prefer MRA as above if diuretic is needed. - No ACEi/ARB/ ARNI right now due to renal function. - Continue Hydralazine to 50mg  three times daily. Will add Imdur 30mg  daily. - Continue Cardura 2mg  twice daily.  - Consider adding SLGT2 inhibitor prior to discharge if renal function stabilizes.  - Please monitor daily weights, strict I/Os, and renal function.  - Etiology unclear. CTA earlier this month showed diffuse coronary and aortic atherosclerosis so cannot rule out ischemia. However he denies any ischemic symptoms. Myoview in 2021 negative. Given his active pneumonia and lack of ischemic symptoms, will defer ischemic evaluation to outpatient setting. Can repeat Echo as an outpatient after GDMT has been optimized and he has recovered from pneumonia. If EF still down at that time, would recommend ischemic evaluation.   Coronary Artery Calcifications CTA earlier this month showed diffuse coronary and aortic atherosclerosis. Prior Myoview in 2021 was negative.  - No chest pain. - Continue Aspirin but can decrease to 81mg  daily. - Intolerant to multiple statins in the past.  2nd Degree Type 1 AV Block (Wenckebach) History of Wenckebach.  - Nothing noted on telemetry. - Continue to avoid AV nodal agents.   Hypertension BP mildly elevated.  - Continue GDMT for CHF as above.  Hyperlipidemia LDL 130 in 02/2021.  - Intolerant to multiple statins in the past.  - Will repeat fasting lipid panel tomorrow morning.  AKI Looks like baseline creatinine is around 1.2. Creatinine 1.52 on  admission and up to 1.88 today.  - Will stop Chlorthalidone as above. -  Continue to monitor closely.   Otherwise, per primary team: - Acute hypoxic respiratory failure - Multifocal pneumonia  - BPH - Deconditioning  For questions or updates, please contact [facility] Please consult [email or web address] for contact info under        Signed, Corrin Parker, PA-C  05/05/2023, 9:50 AM

## 2023-05-05 NOTE — Care Plan (Signed)
 Called materials x3 for aquacel to replace mepilex no answer

## 2023-05-06 ENCOUNTER — Inpatient Hospital Stay (HOSPITAL_COMMUNITY)

## 2023-05-06 DIAGNOSIS — G9340 Encephalopathy, unspecified: Secondary | ICD-10-CM

## 2023-05-06 DIAGNOSIS — N179 Acute kidney failure, unspecified: Secondary | ICD-10-CM | POA: Diagnosis not present

## 2023-05-06 DIAGNOSIS — R0902 Hypoxemia: Secondary | ICD-10-CM | POA: Diagnosis not present

## 2023-05-06 DIAGNOSIS — J189 Pneumonia, unspecified organism: Secondary | ICD-10-CM | POA: Diagnosis not present

## 2023-05-06 DIAGNOSIS — I10 Essential (primary) hypertension: Secondary | ICD-10-CM | POA: Diagnosis not present

## 2023-05-06 DIAGNOSIS — J9601 Acute respiratory failure with hypoxia: Secondary | ICD-10-CM | POA: Diagnosis not present

## 2023-05-06 LAB — BASIC METABOLIC PANEL WITH GFR
Anion gap: 10 (ref 5–15)
BUN: 56 mg/dL — ABNORMAL HIGH (ref 8–23)
CO2: 23 mmol/L (ref 22–32)
Calcium: 9.6 mg/dL (ref 8.9–10.3)
Chloride: 109 mmol/L (ref 98–111)
Creatinine, Ser: 1.97 mg/dL — ABNORMAL HIGH (ref 0.61–1.24)
GFR, Estimated: 32 mL/min — ABNORMAL LOW (ref >60)
Glucose, Bld: 147 mg/dL — ABNORMAL HIGH (ref 70–99)
Potassium: 4 mmol/L (ref 3.5–5.1)
Sodium: 142 mmol/L (ref 135–145)

## 2023-05-06 LAB — BODY FLUID CELL COUNT WITH DIFFERENTIAL
Lymphs, Fluid: 21 %
Monocyte-Macrophage-Serous Fluid: 56 % (ref 50–90)
Neutrophil Count, Fluid: 22 % (ref 0–25)
Other Cells, Fluid: 1 %
Total Nucleated Cell Count, Fluid: 36 uL (ref 0–1000)

## 2023-05-06 LAB — BLOOD GAS, ARTERIAL
Acid-Base Excess: 2.2 mmol/L — ABNORMAL HIGH (ref 0.0–2.0)
Acid-base deficit: 0.2 mmol/L (ref 0.0–2.0)
Bicarbonate: 28.3 mmol/L — ABNORMAL HIGH (ref 20.0–28.0)
Bicarbonate: 29.1 mmol/L — ABNORMAL HIGH (ref 20.0–28.0)
FIO2: 100 %
FIO2: 100 %
MECHVT: 380 mL
MECHVT: 380 mL
O2 Content: 100 L/min
O2 Saturation: 96.6 %
O2 Saturation: 99.5 %
PEEP: 5 cmH2O
PEEP: 10 cmH2O
PIP: 14 cmH2O
PIP: 14 cmH2O
Patient temperature: 37
Patient temperature: 37
RATE: 14 {breaths}/min
RATE: 18 {breaths}/min
pCO2 arterial: 63 mmHg — ABNORMAL HIGH (ref 32–48)
pCO2 arterial: 54 mmHg — ABNORMAL HIGH (ref 32–48)
pH, Arterial: 7.26 — ABNORMAL LOW (ref 7.35–7.45)
pH, Arterial: 7.34 — ABNORMAL LOW (ref 7.35–7.45)
pO2, Arterial: 87 mmHg (ref 83–108)
pO2, Arterial: 120 mmHg — ABNORMAL HIGH (ref 83–108)

## 2023-05-06 LAB — LIPID PANEL
Cholesterol: 151 mg/dL (ref 0–200)
HDL: 22 mg/dL — ABNORMAL LOW (ref >40)
LDL Cholesterol: 89 mg/dL (ref 0–99)
Total CHOL/HDL Ratio: 6.9 ratio
Triglycerides: 200 mg/dL — ABNORMAL HIGH (ref <150)
VLDL: 40 mg/dL (ref 0–40)

## 2023-05-06 MED ORDER — FAMOTIDINE 20 MG PO TABS: 20.0000 mg | ORAL_TABLET | Freq: Two times a day (BID) | ORAL | Status: DC

## 2023-05-06 MED ORDER — ADULT MULTIVITAMIN W/MINERALS CH
1.0000 | ORAL_TABLET | Freq: Every day | ORAL | Status: DC
  Filled 2023-05-06: qty 1

## 2023-05-06 MED ORDER — METHYLPREDNISOLONE SODIUM SUCC 40 MG IJ SOLR
40.0000 mg | Freq: Two times a day (BID) | INTRAMUSCULAR | Status: DC
  Administered 2023-05-06 – 2023-05-09 (×6): 40 mg via INTRAVENOUS
  Filled 2023-05-06 (×6): qty 1

## 2023-05-06 MED ORDER — POLYETHYLENE GLYCOL 3350 17 G PO PACK
17.0000 g | PACK | Freq: Every day | ORAL | Status: DC
Start: 2023-05-06 — End: 2023-05-06

## 2023-05-06 MED ORDER — FAMOTIDINE 20 MG PO TABS
10.0000 mg | ORAL_TABLET | Freq: Every day | ORAL | Status: DC
  Administered 2023-05-07 – 2023-05-10 (×4): 10 mg
  Filled 2023-05-06 (×4): qty 1

## 2023-05-06 MED ORDER — MIDAZOLAM HCL 2 MG/2ML IJ SOLN
INTRAMUSCULAR | Status: AC
  Filled 2023-05-06: qty 2

## 2023-05-06 MED ORDER — FENTANYL CITRATE PF 50 MCG/ML IJ SOSY
25.0000 ug | PREFILLED_SYRINGE | Freq: Once | INTRAMUSCULAR | Status: AC
  Administered 2023-05-06: 25 ug via INTRAVENOUS

## 2023-05-06 MED ORDER — DOCUSATE SODIUM 50 MG/5ML PO LIQD
100.0000 mg | Freq: Two times a day (BID) | ORAL | Status: DC
  Administered 2023-05-07 – 2023-05-09 (×5): 100 mg
  Filled 2023-05-06 (×5): qty 10

## 2023-05-06 MED ORDER — SODIUM CHLORIDE 0.9 % IV SOLN
100.0000 mg | Freq: Once | INTRAVENOUS | Status: AC
  Administered 2023-05-06: 100 mg via INTRAVENOUS
  Filled 2023-05-06: qty 100

## 2023-05-06 MED ORDER — FENTANYL CITRATE PF 50 MCG/ML IJ SOSY: 50.0000 ug | PREFILLED_SYRINGE | Freq: Once | INTRAMUSCULAR | Status: DC

## 2023-05-06 MED ORDER — ETOMIDATE 2 MG/ML IV SOLN
INTRAVENOUS | Status: AC
  Administered 2023-05-06: 20 mg
  Filled 2023-05-06: qty 20

## 2023-05-06 MED ORDER — PHENYLEPHRINE 80 MCG/ML (10ML) SYRINGE FOR IV PUSH (FOR BLOOD PRESSURE SUPPORT)
PREFILLED_SYRINGE | INTRAVENOUS | Status: AC
Start: 2023-05-06 — End: 2023-05-07
  Filled 2023-05-06: qty 10

## 2023-05-06 MED ORDER — DOCUSATE SODIUM 50 MG/5ML PO LIQD: 100.0000 mg | Freq: Two times a day (BID) | ORAL | Status: DC

## 2023-05-06 MED ORDER — ENSURE ENLIVE PO LIQD
237.0000 mL | Freq: Two times a day (BID) | ORAL | Status: DC
Start: 2023-05-06 — End: 2023-05-07
  Administered 2023-05-06: 237 mL via ORAL

## 2023-05-06 MED ORDER — FENTANYL CITRATE PF 50 MCG/ML IJ SOSY
50.0000 ug | PREFILLED_SYRINGE | Freq: Once | INTRAMUSCULAR | Status: AC
  Administered 2023-05-06: 50 ug via INTRAVENOUS

## 2023-05-06 MED ORDER — SODIUM CHLORIDE 0.9 % IV BOLUS
250.0000 mL | Freq: Once | INTRAVENOUS | Status: AC
  Administered 2023-05-06: 250 mL via INTRAVENOUS

## 2023-05-06 MED ORDER — FENTANYL BOLUS VIA INFUSION
25.0000 ug | INTRAVENOUS | Status: DC | PRN
  Administered 2023-05-06 – 2023-05-07 (×12): 100 ug via INTRAVENOUS
  Administered 2023-05-08 (×2): 50 ug via INTRAVENOUS
  Administered 2023-05-08: 75 ug via INTRAVENOUS
  Administered 2023-05-08 – 2023-05-10 (×4): 100 ug via INTRAVENOUS
  Administered 2023-05-10: 50 ug via INTRAVENOUS
  Administered 2023-05-10 (×5): 100 ug via INTRAVENOUS
  Administered 2023-05-10: 50 ug via INTRAVENOUS

## 2023-05-06 MED ORDER — ETOMIDATE 2 MG/ML IV SOLN: 20.0000 mg | Freq: Once | INTRAVENOUS | Status: DC

## 2023-05-06 MED ORDER — ROCURONIUM BROMIDE 10 MG/ML (PF) SYRINGE
PREFILLED_SYRINGE | INTRAVENOUS | Status: AC
  Administered 2023-05-06: 100 mg
  Filled 2023-05-06: qty 10

## 2023-05-06 MED ORDER — PROPOFOL 1000 MG/100ML IV EMUL
INTRAVENOUS | Status: AC
  Administered 2023-05-06: 5 ug/kg/min via INTRAVENOUS
  Filled 2023-05-06: qty 100

## 2023-05-06 MED ORDER — POLYETHYLENE GLYCOL 3350 17 G PO PACK
17.0000 g | PACK | Freq: Every day | ORAL | Status: DC
  Administered 2023-05-07 – 2023-05-09 (×3): 17 g
  Filled 2023-05-06 (×3): qty 1

## 2023-05-06 MED ORDER — CHLORHEXIDINE GLUCONATE CLOTH 2 % EX PADS
6.0000 | MEDICATED_PAD | Freq: Every day | CUTANEOUS | Status: DC
  Administered 2023-05-06 – 2023-05-10 (×5): 6 via TOPICAL

## 2023-05-06 MED ORDER — ORAL CARE MOUTH RINSE
15.0000 mL | OROMUCOSAL | Status: DC
  Administered 2023-05-06 – 2023-05-10 (×47): 15 mL via OROMUCOSAL

## 2023-05-06 MED ORDER — FENTANYL CITRATE PF 50 MCG/ML IJ SOSY
PREFILLED_SYRINGE | INTRAMUSCULAR | Status: AC
Start: 2023-05-06 — End: 2023-05-06
  Administered 2023-05-06: 50 ug
  Filled 2023-05-06: qty 2

## 2023-05-06 MED ORDER — MIDAZOLAM HCL 2 MG/2ML IJ SOLN
2.0000 mg | Freq: Once | INTRAMUSCULAR | Status: AC
  Administered 2023-05-06: 2 mg via INTRAVENOUS

## 2023-05-06 MED ORDER — LORAZEPAM 2 MG/ML IJ SOLN
0.2500 mg | Freq: Once | INTRAMUSCULAR | Status: AC
  Administered 2023-05-06: 0.25 mg via INTRAVENOUS
  Filled 2023-05-06: qty 1

## 2023-05-06 MED ORDER — FENTANYL 2500MCG IN NS 250ML (10MCG/ML) PREMIX INFUSION
25.0000 ug/h | INTRAVENOUS | Status: DC
  Administered 2023-05-06: 25 ug/h via INTRAVENOUS
  Administered 2023-05-07: 75 ug/h via INTRAVENOUS
  Administered 2023-05-09: 100 ug/h via INTRAVENOUS
  Administered 2023-05-10: 50 ug/h via INTRAVENOUS
  Filled 2023-05-06 (×4): qty 250

## 2023-05-06 MED ORDER — ROCURONIUM BROMIDE 10 MG/ML (PF) SYRINGE: 50.0000 mg | PREFILLED_SYRINGE | Freq: Once | INTRAVENOUS | Status: DC

## 2023-05-06 MED ORDER — PROPOFOL 1000 MG/100ML IV EMUL
0.0000 ug/kg/min | INTRAVENOUS | Status: DC
  Administered 2023-05-06: 5 ug/kg/min via INTRAVENOUS
  Administered 2023-05-07 (×3): 25 ug/kg/min via INTRAVENOUS
  Administered 2023-05-08: 35 ug/kg/min via INTRAVENOUS
  Administered 2023-05-08: 25 ug/kg/min via INTRAVENOUS
  Administered 2023-05-08 (×2): 35 ug/kg/min via INTRAVENOUS
  Administered 2023-05-09: 15 ug/kg/min via INTRAVENOUS
  Administered 2023-05-09: 25 ug/kg/min via INTRAVENOUS
  Administered 2023-05-09 (×2): 35 ug/kg/min via INTRAVENOUS
  Filled 2023-05-06 (×11): qty 100

## 2023-05-06 NOTE — Progress Notes (Signed)
 NAME:  Patrick Villa, MRN:  [id number], DOB:  Feb 14, 1933, LOS: 5 ADMISSION DATE:  04/30/2023, CONSULTATION DATE:  4/8 REFERRING MD:  Patrick Villa, CHIEF COMPLAINT:  hypoxia   History of Present Illness:  Patrick Villa is an 89 y/o gentleman with a history of COPD, type 2 diabetes who presented with weakness, shortness of breath that are worse on the day of presentation but had been present for about 2 weeks.  In the emergency department he was saturating 87%.  Prior to presentation to the hospital he had been treated with doxycycline and Augmentin as an outpatient.  He was admitted for low saturations and failure of outpatient antibiotics.  Since admission he has been continued on azithromycin and ceftriaxone.  He was on 2 L nasal cannula from 4/5 until 4/7 when he jumped up to 7 L oxygen in the afternoon on 4/7. Today he feels ok; overall he reports feeling improved since admission. His nurse is concerning with how quickly his oxygen needs have risen in the past day. He denies reflux/ GERD symptoms and denies aspiration when eating/ drinking.  No family history of lung disease. He did not know he had COPD and is not on inhalers PTA. He quit smoking in the 80s after ~1ppd x ~40 years. He worked as a Technical sales engineer.   Pertinent  Medical History  HLD HTN DM COPD  Significant Hospital Events: Including procedures, antibiotic start and stop dates in addition to other pertinent events   4/5 admitted for pneumonia, started ceftriaxone and azithromycin 4/7 increasing oxygen requirements, started steroids after CT showed diffuse GG. Antibiotics escalated to cover HAP.  Interim History / Subjective:  More altered today. Per nursing who is familiar with him he seems more confused, lethargic. Received ativan x1 overnight for restlessness and when his mitts were removed he pulled out his IV. T  Objective   Blood pressure 136/71, pulse 94, temperature 100 F (37.8 C), temperature source Axillary, resp. rate 16,  height 5\' 6"  (1.676 m), weight 83.5 kg, SpO2 92%.        Intake/Output Summary (Last 24 hours) at 05/06/2023 0950 Last data filed at 05/06/2023 0857 Gross per 24 hour  Intake 553.71 ml  Output 1500 ml  Net -946.29 ml   Filed Weights   04/30/23 1234 04/30/23 1813  Weight: 83.5 kg 83.5 kg    Examination: Frail elderly man laying flat comfortably not in respiratory distress RRR On 9LNC Breathing is nonlabored but a little shallow He is alert and oriented to self and place but does not respond to questions appropriately No peripheral edema  CXR personally reviewed> R>L still has diffuse mild opacities  Na 142 K 4.0 Cr 1.97 COVID, flu, RSV negative RVP negative  BNP 158 ESR 83 CRP 18.4 Strep pneumo negative   Resolved Hospital Problem list     Assessment & Plan:  Acute respiratory failure with hypoxia Differential diagnosis includes some kind of AIP including acute HP, organizing pneumonia, viral/atypical infection.  Less likely acute pulmonary edema given downtrending BNP, physical exam without signs of volume overload - agree that he is euvolemic. Respiratory viral 20 panel negative, as are flu, covid, rsv. Mrsa negative. Blood cultures negative. Strep pneumoniae negative. Will send legionella urinary ag for completeness.  - continue zosyn. He has not been bring up sputum culture. -con't broad antibiotics- zosyn. Collect sputum culture if able. - will decrease steroids to 1 mg/kg dosing given worsening delirium overnight. -wean oxygen as able to maintain SpO2 90-04% -pulmonary hygiene;  needs more OOB mobility. IS at bedside.    Acute encephalopathy Delirium from hospital stay, hypoxemia, age and steroid use - moving to step down per primary  Rest of care per primary.   Will follow  Patrick Salts, MD Pulmonary and Critical Care Medicine [facility] 05/06/2023 10:01 AM Pager: see AMION  If no response to pager, please call critical care on call (see  AMION) until 7pm After 7:00 pm call Elink       Best Practice (right click and "Reselect all SmartList Selections" daily)   Per primary  Labs   CBC: Recent Labs  Lab 04/30/23 1350 05/01/23 0515 05/03/23 0525 05/04/23 0447  WBC 6.5 6.3 6.8 10.4  NEUTROABS 5.2 4.6  --   --   HGB 10.9* 10.5* 11.9* 11.5*  HCT 34.0* 34.1* 39.4 36.0*  MCV 83.1 87.4 86.8 85.1  PLT 270 232 293 277    Basic Metabolic Panel: Recent Labs  Lab 05/01/23 0515 05/02/23 0446 05/03/23 0525 05/04/23 0447 05/05/23 0450 05/06/23 0406  NA 140 141 137 136 141 142  K 3.1* 2.9* 3.3* 3.0* 3.2* 4.0  CL 105 100 101 96* 104 109  CO2 24 26 25 27 26 23   GLUCOSE 96 122* 125* 155* 150* 147*  BUN 21 25* 28* 37* 45* 56*  CREATININE 1.18 1.51* 1.38* 1.78* 1.88* 1.97*  CALCIUM 9.0 9.6 9.3 9.1 9.3 9.6  MG 1.7  --  2.3  --   --   --    GFR: Estimated Creatinine Clearance: 26.8 mL/min (A) (by C-G formula based on SCr of 1.97 mg/dL (H)). Recent Labs  Lab 04/30/23 1350 04/30/23 1537 05/01/23 0515 05/03/23 0525 05/04/23 0447  WBC 6.5  --  6.3 6.8 10.4  LATICACIDVEN 1.5 1.0  --   --   --     Liver Function Tests: Recent Labs  Lab 04/30/23 1350 05/01/23 0515  AST 50* 61*  ALT 29 35  ALKPHOS 52 49  BILITOT 0.4 0.5  PROT 7.0 6.2*  ALBUMIN 3.4* 2.4*

## 2023-05-06 NOTE — Progress Notes (Signed)
 Patient was intubated at 1758 by Dr Celine Mans. Bronchoscopy performed following intubation, as agreed upon during discussion with patient's daughter Eber Jones prior to procedure. Patient tolerated both procedures well, see physician notes for further details. Samples sent to lab by RT.   Following procedures, patient became extremely hypertensive, with SBP in 230's. Dr Celine Mans notified by RN and 2 mg Versed, 100 mcg fentanyl bolus, and propofol gtt initiated. EKG also obtained and placed in patient's chart per policy.   SBP now trending down, with SBP in 160's. Handoff given to nightshift RN who will continue to closely monitor patient for changes in hemodynamic status.

## 2023-05-06 NOTE — Progress Notes (Signed)
 RN notified

## 2023-05-06 NOTE — Plan of Care (Signed)
  Problem: Coping: Goal: Level of anxiety will decrease Outcome: Progressing   Problem: Elimination: Goal: Will not experience complications related to urinary retention Outcome: Progressing   

## 2023-05-06 NOTE — Progress Notes (Signed)
 Progress update  Patient transferred to ICU for progressive hypoxemic respiratory failure and encephalopathy. On HHFNC sats between 89-96. Discussed patients care with wife and daughter at bedside.  Reviewed imaging and clinical course. They are ok with intubation if needed. Given diagnostic uncertainty ok with intubation and bronchoscopy with Bal to evaluate further. See separate procedure notes. PCCM will assume care   CC time 35 minutes  Durel Salts, MD Pulmonary and Critical Care Medicine [facility] 05/06/2023 6:25 PM Pager: see AMION  If no response to pager, please call critical care on call (see AMION) until 7pm After 7:00 pm call Elink

## 2023-05-06 NOTE — Progress Notes (Signed)
 eLink Physician-Brief Progress Note Patient Name: Patrick Villa DOB: [birth year] MRN: [id number]   Date of Service  05/06/2023  HPI/Events of Note  ABG reviewed.  eICU Interventions  PEEP increased to 10 and respiratory rate increased to 18, ABG at 10 pm.        [name] 05/06/2023, 9:10 PM

## 2023-05-06 NOTE — Procedures (Signed)
 Intubation Procedure Note  NASHON ERBES  [id number]  09-27-1933  Date:05/06/23  Time:6:28 PM   Provider Performing:Timoteo Carreiro Humphrey Rolls    Procedure: Intubation (31500)  Indication(s) Respiratory Failure  Consent Risks of the procedure as well as the alternatives and risks of each were explained to the patient and/or caregiver.  Consent for the procedure was obtained and is signed in the bedside chart   Anesthesia Etomidate, Fentanyl, and Rocuronium   Time Out Verified patient identification, verified procedure, site/side was marked, verified correct patient position, special equipment/implants available, medications/allergies/relevant history reviewed, required imaging and test results available.   Sterile Technique Usual hand hygeine, masks, and gloves were used   Procedure Description Patient positioned in bed supine.  Sedation given as noted above.  Patient was intubated with endotracheal tube using Glidescope.  View was Grade 1 full glottis .  Number of attempts was 1.  Colorimetric CO2 detector was consistent with tracheal placement.   Complications/Tolerance None; patient tolerated the procedure well. Chest X-ray is ordered to verify placement.   EBL None  Specimen(s) None

## 2023-05-06 NOTE — Procedures (Signed)
 Bronchoscopy Procedure Note  Patrick Villa  [id number]  [birth year]  Date:05/06/23  Time:6:30 PM   Provider Performing:Habiba Treloar S Celine Mans   Procedure(s):  Flexible bronchoscopy with bronchial alveolar lavage [phone])  Indication(s) Respiratory failure, pneumonia  Consent Risks of the procedure as well as the alternatives and risks of each were explained to the patient and/or caregiver.  Consent for the procedure was obtained and is signed in the bedside chart  Anesthesia intubated   Time Out Verified patient identification, verified procedure, site/side was marked, verified correct patient position, special equipment/implants available, medications/allergies/relevant history reviewed, required imaging and test results available.   Sterile Technique Usual hand hygiene, masks, gowns, and gloves were used   Procedure Description Bronchoscope advanced through endotracheal tube and into airway.  Airways were examined down to subsegmental level with findings noted below.   Following diagnostic evaluation, BAL(s) performed in RML with normal saline and return of 30 fluid  Findings: RML BAL, return of cellular fluid   Complications/Tolerance None; patient tolerated the procedure well. Chest X-ray is not needed post procedure.   EBL none   Specimen(s) BAL sent for cytology, cell count, gram stain culture pneumocystis

## 2023-05-06 NOTE — Progress Notes (Signed)
   05/06/23 1812  Vent Select  $ Ventilator Initial/Subsequent  (S)  Initial  $ Intubation  Yes  Invasive or Noninvasive Invasive  Adult Vent Y  Vent start date (S)  05/06/23  Vent start time (S)  1812  Airway 8 mm  Placement Date/Time: 05/06/23 1800   Placed By: ICU physician  Airway Device: Endotracheal Tube  Laryngoscope Blade: 3  ETT Types: Oral  Size (mm): 8 mm  Cuffed: Cuffed  Insertion attempts: 1  Airway Equipment: Stylet;Video Laryngoscope  Placement Con...  Secured at (cm) (S)  24 cm  Measured From Lips  Secured Location Right  Secured By [employer] Tape  Bite Block No  Prone position No  Head position Right  Cuff Pressure (cm H2O) Green OR 18-26 CmH2O  Site Condition Dry  Adult Ventilator Settings  Vent Type Servo i  Humidity HME  Vent Mode (S)  PRVC  Vt Set (S)  510 mL  Set Rate (S)  18 bmp  FiO2 (%) (S)  100 %  I Time 1.06 Sec(s)  PEEP (S)  5 cmH20  Adult Ventilator Measurements  Peak Airway Pressure 30 L/min  Mean Airway Pressure 13 cmH20  Resp Rate Spontaneous 0 br/min  Resp Rate Total 18 br/min  Exhaled Vt 493 mL  Measured Ve 9 L  I:E Ratio Measured 1:2.2  Total PEEP 5 cmH20  SpO2 95 %  Adult Ventilator Alarms  Alarms On Y  Ve High Alarm 26 L/min  Ve Low Alarm 4 L/min  Resp Rate High Alarm 36 br/min  Resp Rate Low Alarm 8  PEEP Low Alarm 3 cmH2O  Press High Alarm 40 cmH2O  T Apnea 20 sec(s)  VAP Prevention  HOB> 30 Degrees Y  Breath Sounds  Bilateral Breath Sounds Diminished  Vent Respiratory Assessment  Level of Consciousness Unresponsive  Respiratory Pattern Regular;Unlabored  Patient Tolerance Tolerated well  Suction Method  Respiratory Interventions Oral suction (Bronch completed by MD.)

## 2023-05-06 NOTE — Progress Notes (Addendum)
 PROGRESS NOTE  Patrick Villa  ZOX:[id number] DOB: 1933/11/20 DOA: 04/30/2023 PCP: Ollen Bowl, MD   Brief Narrative: Patient is a 89 year old male with history of COPD, diabetes mellitus, hyperlipidemia, hypertension, BPH who presented to the device department with complaint of generalized weakness, shortness of breath, nonproductive cough, fever.  On presentation,he was hypoxic at 87%.  He was recently treated for multifocal pneumonia with Augmentin and doxycycline as an outpatient but continued to have symptoms.  On presentation, he was febrile up to one 101 .2F, saturating 89% on room air, potassium 3.1, creatinine 1.5.  Chest x-ray showed patchy airspace opacity with bilateral pleural effusion, possible mild pulmonary edema.  Patient admitted for further management of multifocal pneumonia not responding to outpatient antibiotic therapy.  Also suspected to have volume overload, started on IV Lasix.  Echo showed EF of 40 to 45%, cardiology also consulted.  Hospital course remarkable for worsening respiratory status, now on HF oxygen.  PCCM also consulted.  Suspicion for BOOP, plan is to continue antibiotics and wait for clinical improvement.  Assessment & Plan:  Principal Problem:   Multifocal pneumonia Active Problems:   Essential hypertension   COPD (chronic obstructive pulmonary disease) (HCC)   Hyperlipidemia   BPH (benign prostatic hyperplasia)   Hypokalemia   AKI (acute kidney injury) (HCC)   Hypoxia   Acute systolic heart failure (HCC)   Aortic atherosclerosis (HCC)   Coronary artery calcification   Multifocal pneumonia/community-acquired pneumonia/suspicion for BOOP/acute hypoxic respiratory failure:He was recently treated for multifocal pneumonia with Augmentin and doxycycline as an outpatient but continued to have symptoms.  Chest x-ray showed patchy airspace opacity with suspicion of bilateral pleural effusion, possible mild pulmonary edema.  Started on broad-spectrum  antibiotics.  Lactic acid was normal on presentation.  Follow-up cultures:NGTD.  No leukocytosis or fever this morning.  Chest x-ray follow-up on 4/8  showed right sided pneumonia.  PCCM consulted due to worsening respiratory status.  CT chest showed worsening multifocal pneumonia.  Suspicion for BOOP.Antibiotics changed to  Zosyn.  Will also continue doxycycline because of history of recent tick bites. Negative streptococcal pneumoniae antigen.  Also started on steroids. Will continue to attempt to wean the oxygen but our effort has been unsuccessful.  Continues to require high flow oxygen but patient is not in  respiratory distress.  Acute HFrEF: Does not have peripheral edema but had bilateral crackles.  Chest imaging suggestive of features of pulmonary edema.  Elevated BNP as per 4/1.  Given  2 doses of Lasix IV 40 mg on 4/6.  Echo showed EF of 40 to 45%, global hypokinesis, grade 1 diastolic dysfunction.  Cardiology consulted, plan was  for a right/left heart cath but now deferred because of worsening respiratory status.  Cardiology will follow him up as an outpatient and plan for cath if necessary. Lasix on hold because creatinine trended up  COPD: Not on on oxygen at home. He is a past smoker,No wheezing  AKI: Creatinine trended up to 1.9.  Baseline creatinine normal.  Will continue to hold Lasix for now.  Continue to monitor BMP  Hypokalemia: Supplemented with potassium and corrected  Hypertension: On imdur, hydralazine.Continue PRN medication for severe hypertension. on doxazosine  WJX:BJYNWGNF doxazosin,proscar  Debility/deconditioning: Patient lives alone.  Ambulates well.  He daughter lives just a few minutes away.   PT/OT recommend home health on discharge  Addendum:  Patient seen and examined at bedside this afternoon. Now appears very lethargic.On 100% HHF.I discussed with Dr Criselda Peaches might need intubation.  I had a long discussion again with family at bedside about his currently  situation.family expects aggressive treatment approach and they are hopeful he will recover from this. His fiance and son in law were at bedside         DVT prophylaxis:enoxaparin (LOVENOX) injection 30 mg Start: 05/05/23 2200     Code Status: Full Code  Family Communication:  discussed with daughter Eber Jones on phone today  Patient status:Inpatient  Patient is from :Home  Anticipated discharge ZO:XWRU  Estimated DC date: not sure, currently on high flow oxygen   Consultants: Cardiology, PCCM  Procedures:None  Antimicrobials:  Anti-infectives (From admission, onward)    Start     Dose/Rate Route Frequency Ordered Stop   05/04/23 1200  vancomycin (VANCOCIN) IVPB 1000 mg/200 mL premix  Status:  Discontinued        1,000 mg 200 mL/hr over 60 Minutes Intravenous Every 24 hours 05/03/23 0953 05/04/23 0916   05/03/23 1130  doxycycline (VIBRA-TABS) tablet 100 mg        100 mg Oral Every 12 hours 05/03/23 1043     05/03/23 1030  piperacillin-tazobactam (ZOSYN) IVPB 3.375 g        3.375 g 12.5 mL/hr over 240 Minutes Intravenous Every 8 hours 05/03/23 0943     05/03/23 1030  vancomycin (VANCOREADY) IVPB 1500 mg/300 mL        1,500 mg 150 mL/hr over 120 Minutes Intravenous  Once 05/03/23 0943 05/04/23 1106   05/01/23 1000  cefTRIAXone (ROCEPHIN) 2 g in sodium chloride 0.9 % 100 mL IVPB  Status:  Discontinued        2 g 200 mL/hr over 30 Minutes Intravenous Every 24 hours 04/30/23 1844 05/03/23 0940   05/01/23 1000  azithromycin (ZITHROMAX) 500 mg in sodium chloride 0.9 % 250 mL IVPB  Status:  Discontinued        500 mg 250 mL/hr over 60 Minutes Intravenous Every 24 hours 04/30/23 1844 05/03/23 1043   04/30/23 1345  cefTRIAXone (ROCEPHIN) 2 g in sodium chloride 0.9 % 100 mL IVPB        2 g 200 mL/hr over 30 Minutes Intravenous Once 04/30/23 1332 04/30/23 1455   04/30/23 1345  azithromycin (ZITHROMAX) 500 mg in sodium chloride 0.9 % 250 mL IVPB        500 mg 250 mL/hr over 60  Minutes Intravenous  Once 04/30/23 1332 04/30/23 1534       Subjective: Patient seen and examined at bedside today.  He was trying to eat his breakfast during my evaluation.  On 10 L of high flow oxygen but he does not appear to be severely dyspneic , does not have labored breathing.  He says he feels well.  Did not cough during my evaluation.  Afebrile this morning  Objective: Vitals:   05/06/23 0700 05/06/23 0800 05/06/23 0855 05/06/23 0929  BP:      Pulse:      Resp:      Temp:      TempSrc:      SpO2: 91% (!) 88% 90% 92%  Weight:      Height:        Intake/Output Summary (Last 24 hours) at 05/06/2023 1058 Last data filed at 05/06/2023 0857 Gross per 24 hour  Intake 553.71 ml  Output 1500 ml  Net -946.29 ml   Filed Weights   04/30/23 1234 04/30/23 1813  Weight: 83.5 kg 83.5 kg    Examination:   General exam: Overall comfortable,  not in distress, very pleasant elderly male HEENT: PERRL Respiratory system: Bilateral rhonchi, crackles Cardiovascular system: S1 & S2 heard, RRR.  Gastrointestinal system: Abdomen is nondistended, soft and nontender. Central nervous system: Alert and oriented Extremities: No edema, no clubbing ,no cyanosis Skin: No rashes, no ulcers,no icterus    Data Reviewed: I have personally reviewed following labs and imaging studies  CBC: Recent Labs  Lab 04/30/23 1350 05/01/23 0515 05/03/23 0525 05/04/23 0447  WBC 6.5 6.3 6.8 10.4  NEUTROABS 5.2 4.6  --   --   HGB 10.9* 10.5* 11.9* 11.5*  HCT 34.0* 34.1* 39.4 36.0*  MCV 83.1 87.4 86.8 85.1  PLT 270 232 293 277   Basic Metabolic Panel: Recent Labs  Lab 05/01/23 0515 05/02/23 0446 05/03/23 0525 05/04/23 0447 05/05/23 0450 05/06/23 0406  NA 140 141 137 136 141 142  K 3.1* 2.9* 3.3* 3.0* 3.2* 4.0  CL 105 100 101 96* 104 109  CO2 24 26 25 27 26 23   GLUCOSE 96 122* 125* 155* 150* 147*  BUN 21 25* 28* 37* 45* 56*  CREATININE 1.18 1.51* 1.38* 1.78* 1.88* 1.97*  CALCIUM 9.0 9.6 9.3  9.1 9.3 9.6  MG 1.7  --  2.3  --   --   --      Recent Results (from the past 240 hours)  Resp panel by RT-PCR (RSV, Flu A&B, Covid) Anterior Nasal Swab     Status: None   Collection Time: 04/26/23  1:23 PM   Specimen: Anterior Nasal Swab  Result Value Ref Range Status   SARS Coronavirus 2 by RT PCR NEGATIVE NEGATIVE Final    Comment: (NOTE) SARS-CoV-2 target nucleic acids are NOT DETECTED.  The SARS-CoV-2 RNA is generally detectable in upper respiratory specimens during the acute phase of infection. The lowest concentration of SARS-CoV-2 viral copies this assay can detect is 138 copies/mL. A negative result does not preclude SARS-Cov-2 infection and should not be used as the sole basis for treatment or other patient management decisions. A negative result may occur with  improper specimen collection/handling, submission of specimen other than nasopharyngeal swab, presence of viral mutation(s) within the areas targeted by this assay, and inadequate number of viral copies(<138 copies/mL). A negative result must be combined with clinical observations, patient history, and epidemiological information. The expected result is Negative.  Fact Sheet for Patients:  BloggerCourse.com  Fact Sheet for Healthcare Providers:  SeriousBroker.it  This test is no t yet approved or cleared by the [place] FDA and  has been authorized for detection and/or diagnosis of SARS-CoV-2 by FDA under an Emergency Use Authorization (EUA). This EUA will remain  in effect (meaning this test can be used) for the duration of the COVID-19 declaration under Section 564(b)(1) of the Act, 21 U.S.C.section 360bbb-3(b)(1), unless the authorization is terminated  or revoked sooner.       Influenza A by PCR NEGATIVE NEGATIVE Final   Influenza B by PCR NEGATIVE NEGATIVE Final    Comment: (NOTE) The Xpert Xpress SARS-CoV-2/FLU/RSV plus assay is intended as an  aid in the diagnosis of influenza from Nasopharyngeal swab specimens and should not be used as a sole basis for treatment. Nasal washings and aspirates are unacceptable for Xpert Xpress SARS-CoV-2/FLU/RSV testing.  Fact Sheet for Patients: BloggerCourse.com  Fact Sheet for Healthcare Providers: SeriousBroker.it  This test is not yet approved or cleared by the [place] FDA and has been authorized for detection and/or diagnosis of SARS-CoV-2 by FDA under an Emergency Use  Authorization (EUA). This EUA will remain in effect (meaning this test can be used) for the duration of the COVID-19 declaration under Section 564(b)(1) of the Act, 21 U.S.C. section 360bbb-3(b)(1), unless the authorization is terminated or revoked.     Resp Syncytial Virus by PCR NEGATIVE NEGATIVE Final    Comment: (NOTE) Fact Sheet for Patients: BloggerCourse.com  Fact Sheet for Healthcare Providers: SeriousBroker.it  This test is not yet approved or cleared by the [place] FDA and has been authorized for detection and/or diagnosis of SARS-CoV-2 by FDA under an Emergency Use Authorization (EUA). This EUA will remain in effect (meaning this test can be used) for the duration of the COVID-19 declaration under Section 564(b)(1) of the Act, 21 U.S.C. section 360bbb-3(b)(1), unless the authorization is terminated or revoked.  Performed at [facility], [address], [place], [place] [address]   Blood Culture (routine x 2)     Status: None   Collection Time: 04/30/23  1:40 PM   Specimen: BLOOD  Result Value Ref Range Status   Specimen Description   Final    BLOOD RIGHT ANTECUBITAL Performed at [facility], [address], [place], [place] [address]    Special Requests   Final    BOTTLES DRAWN AEROBIC AND ANAEROBIC Blood Culture results may not be optimal due to an  excessive volume of blood received in culture bottles Performed at [facility], [address] [place], [place] [address]    Culture   Final    NO GROWTH 5 DAYS Performed at [facility], [address], [place], [place] [address]    Report Status 05/05/2023 FINAL  Final  Resp panel by RT-PCR (RSV, Flu A&B, Covid) Anterior Nasal Swab     Status: None   Collection Time: 04/30/23  1:50 PM   Specimen: Anterior Nasal Swab  Result Value Ref Range Status   SARS Coronavirus 2 by RT PCR NEGATIVE NEGATIVE Final    Comment: (NOTE) SARS-CoV-2 target nucleic acids are NOT DETECTED.  The SARS-CoV-2 RNA is generally detectable in upper respiratory specimens during the acute phase of infection. The lowest concentration of SARS-CoV-2 viral copies this assay can detect is 138 copies/mL. A negative result does not preclude SARS-Cov-2 infection and should not be used as the sole basis for treatment or other patient management decisions. A negative result may occur with  improper specimen collection/handling, submission of specimen other than nasopharyngeal swab, presence of viral mutation(s) within the areas targeted by this assay, and inadequate number of viral copies(<138 copies/mL). A negative result must be combined with clinical observations, patient history, and epidemiological information. The expected result is Negative.  Fact Sheet for Patients:  BloggerCourse.com  Fact Sheet for Healthcare Providers:  SeriousBroker.it  This test is no t yet approved or cleared by the [place] FDA and  has been authorized for detection and/or diagnosis of SARS-CoV-2 by FDA under an Emergency Use Authorization (EUA). This EUA will remain  in effect (meaning this test can be used) for the duration of the COVID-19 declaration under Section 564(b)(1) of the Act, 21 U.S.C.section 360bbb-3(b)(1), unless the authorization is  terminated  or revoked sooner.       Influenza A by PCR NEGATIVE NEGATIVE Final   Influenza B by PCR NEGATIVE NEGATIVE Final    Comment: (NOTE) The Xpert Xpress SARS-CoV-2/FLU/RSV plus assay is intended as an aid in the diagnosis of influenza from Nasopharyngeal swab specimens and should not be used as a  sole basis for treatment. Nasal washings and aspirates are unacceptable for Xpert Xpress SARS-CoV-2/FLU/RSV testing.  Fact Sheet for Patients: BloggerCourse.com  Fact Sheet for Healthcare Providers: SeriousBroker.it  This test is not yet approved or cleared by the [place] FDA and has been authorized for detection and/or diagnosis of SARS-CoV-2 by FDA under an Emergency Use Authorization (EUA). This EUA will remain in effect (meaning this test can be used) for the duration of the COVID-19 declaration under Section 564(b)(1) of the Act, 21 U.S.C. section 360bbb-3(b)(1), unless the authorization is terminated or revoked.     Resp Syncytial Virus by PCR NEGATIVE NEGATIVE Final    Comment: (NOTE) Fact Sheet for Patients: BloggerCourse.com  Fact Sheet for Healthcare Providers: SeriousBroker.it  This test is not yet approved or cleared by the [place] FDA and has been authorized for detection and/or diagnosis of SARS-CoV-2 by FDA under an Emergency Use Authorization (EUA). This EUA will remain in effect (meaning this test can be used) for the duration of the COVID-19 declaration under Section 564(b)(1) of the Act, 21 U.S.C. section 360bbb-3(b)(1), unless the authorization is terminated or revoked.  Performed at [facility], [address], [place], [place] [address]   Blood Culture (routine x 2)     Status: None   Collection Time: 04/30/23  2:05 PM   Specimen: BLOOD LEFT FOREARM  Result Value Ref Range Status   Specimen Description   Final     BLOOD LEFT FOREARM Performed at [facility], [address], [place], [place] [address]    Special Requests   Final    BOTTLES DRAWN AEROBIC AND ANAEROBIC Blood Culture adequate volume Performed at [facility], [address] [place], [place] [address]    Culture   Final    NO GROWTH 5 DAYS Performed at [facility], [address], [place], [place] [address]    Report Status 05/05/2023 FINAL  Final  MRSA Next Gen by PCR, Nasal     Status: None   Collection Time: 05/03/23  1:01 PM   Specimen: Nasal Mucosa; Nasal Swab  Result Value Ref Range Status   MRSA by PCR Next Gen NOT DETECTED NOT DETECTED Final    Comment: (NOTE) The GeneXpert MRSA Assay (FDA approved for NASAL specimens only), is one component of a comprehensive MRSA colonization surveillance program. It is not intended to diagnose MRSA infection nor to guide or monitor treatment for MRSA infections. Test performance is not FDA approved in patients less than 28 years old. Performed at [facility], [address], [place], [place] [address]   Respiratory (~20 pathogens) panel by PCR     Status: None   Collection Time: 05/03/23  3:30 PM   Specimen: Nasopharyngeal Swab; Respiratory  Result Value Ref Range Status   Adenovirus NOT DETECTED NOT DETECTED Final   Coronavirus 229E NOT DETECTED NOT DETECTED Final    Comment: (NOTE) The Coronavirus on the Respiratory Panel, DOES NOT test for the novel  Coronavirus (2019 nCoV)    Coronavirus HKU1 NOT DETECTED NOT DETECTED Final   Coronavirus NL63 NOT DETECTED NOT DETECTED Final   Coronavirus OC43 NOT DETECTED NOT DETECTED Final   Metapneumovirus NOT DETECTED NOT DETECTED Final   Rhinovirus / Enterovirus NOT DETECTED NOT DETECTED Final   Influenza A NOT DETECTED NOT DETECTED Final   Influenza B NOT DETECTED NOT DETECTED Final   Parainfluenza Virus 1 NOT DETECTED NOT DETECTED Final   Parainfluenza Virus 2 NOT  DETECTED NOT DETECTED Final   Parainfluenza Virus 3 NOT DETECTED NOT DETECTED Final   Parainfluenza Virus 4 NOT DETECTED NOT DETECTED Final   Respiratory Syncytial Virus NOT DETECTED NOT DETECTED Final   Bordetella pertussis NOT DETECTED NOT DETECTED Final   Bordetella Parapertussis NOT DETECTED NOT DETECTED Final   Chlamydophila pneumoniae NOT DETECTED NOT DETECTED Final   Mycoplasma pneumoniae NOT DETECTED NOT DETECTED Final    Comment: Performed at [facility], [address], [place], [place] [address]     Radiology Studies: DG CHEST PORT 1 VIEW Result Date: 05/05/2023 CLINICAL DATA:  Hypoxia. EXAM: PORTABLE CHEST 1 VIEW COMPARISON:  May 03, 2023. FINDINGS: Stable cardiomediastinal silhouette. Continued right lung opacities are noted concerning for pneumonia. Left lung is unremarkable. Bony thorax is unremarkable. IMPRESSION: Continued right lung opacities are noted concerning for pneumonia. Electronically Signed   By: Lupita Raider M.D.   On: 05/05/2023 10:53    Scheduled Meds:  aspirin EC  81 mg Oral Daily   cholecalciferol  2,000 Units Oral Daily   doxazosin  2 mg Oral Q12H   doxycycline  100 mg Oral Q12H   enoxaparin (LOVENOX) injection  30 mg Subcutaneous Q24H   finasteride  5 mg Oral Daily   hydrALAZINE  50 mg Oral TID   ipratropium-albuterol  3 mL Nebulization TID   isosorbide mononitrate  30 mg Oral Daily   methylPREDNISolone (SOLU-MEDROL) injection  40 mg Intravenous Q12H   potassium chloride  40 mEq Oral BID   Continuous Infusions:  piperacillin-tazobactam (ZOSYN)  IV 12.5 mL/hr at 05/06/23 0405     LOS: 5 days   Burnadette Pop, MD [facility] P4/11/2023, 10:58 AM

## 2023-05-06 NOTE — Progress Notes (Signed)
 Pharmacy Antibiotic Note  Patrick Villa is a 89 y.o. male admitted on 04/30/2023 with CAP, recently treated PNA with Augmentin and doxycycline as an outpatient but continued to have symptoms.  He was initially on Ceftriaxone/Azith inpatient.  Pharmacy is consulted for Zosyn dosing for HCAP.   Today, 05/06/2023: Day #7 abx total, Day #4 Zosyn  SCr further increased to 1.97 Tm 100, WBC 10.4 (steroids)  Plan: Continue Zosyn 3.375g IV Q8H infused over 4hrs. - will renally adjust Zosyn if CrCl < 20 ml/min Follow up renal function, culture results, and clinical course.   Height: 5\' 6"  (167.6 cm) Weight: 83.5 kg (184 lb 1.4 oz) IBW/kg (Calculated) : 63.8  Temp (24hrs), Avg:99.4 F (37.4 C), Min:98.8 F (37.1 C), Max:100 F (37.8 C)  Recent Labs  Lab 04/30/23 1350 04/30/23 1537 05/01/23 0515 05/02/23 0446 05/03/23 0525 05/04/23 0447 05/05/23 0450 05/06/23 0406  WBC 6.5  --  6.3  --  6.8 10.4  --   --   CREATININE 1.52*  --  1.18 1.51* 1.38* 1.78* 1.88* 1.97*  LATICACIDVEN 1.5 1.0  --   --   --   --   --   --     Estimated Creatinine Clearance: 26.8 mL/min (A) (by C-G formula based on SCr of 1.97 mg/dL (H)).    Allergies  Allergen Reactions   Amlodipine Other (See Comments)    Patient developed jitteriness, tremors and unstable gait.  Same issues when re-challenged   Lipitor [Atorvastatin Calcium]     Weakness.   Zocor [Simvastatin]    Bactrim [Sulfamethoxazole-Trimethoprim] Rash   Pravachol Hives and Rash    Antimicrobials this admission: 4/5 Azithromycin >>4/8 4/5 Ceftriaxone >> 4/7 4/8 Zosyn >> 4/8 Vancomycin >> 4/9 4/8 Doxycycline >>   Dose adjustments this admission: N/a  Microbiology results: 4/5 Resp panel: neg covid, flu, rsv 4/5 BCx: ngf 4/8 sputum: 4/8 MRSA PCR:  not detected 4/8 Resp panel: none detected  Thank you for allowing pharmacy to be a part of this patient's care.  Lynann Beaver PharmD, BCPS [facility] main pharmacy [phone] 05/06/2023 8:49  AM

## 2023-05-06 NOTE — Progress Notes (Signed)
 Initial Nutrition Assessment  INTERVENTION:   -Ensure Plus High Protein po BID, each supplement provides 350 kcal and 20 grams of protein.   -Multivitamin with minerals daily  -If PO intake or mentation does not improve, consider Cortrak placement and initiation of enteral nutrition.  -Daily weights  NUTRITION DIAGNOSIS:   Increased nutrient needs related to chronic illness, acute illness as evidenced by estimated needs.  GOAL:   Patient will meet greater than or equal to 90% of their needs  MONITOR:   PO intake, Supplement acceptance  REASON FOR ASSESSMENT:   Consult Poor PO  ASSESSMENT:   89 year old male with history of COPD, diabetes mellitus, hyperlipidemia, hypertension, BPH who presented to the device department with complaint of generalized weakness, shortness of breath, nonproductive cough, fever.  On presentation,he was hypoxic at 87%.  He was recently treated for multifocal pneumonia with Augmentin and doxycycline as an outpatient but continued to have symptoms.    Chest x-ray showed patchy airspace opacity with bilateral pleural effusion, possible mild pulmonary edema.  Patient admitted for further management of multifocal pneumonia not responding to outpatient antibiotic therapy.  Patient currently being transferred to 2W stepdown given increased confusion and desats.  Unable to gather history at this time.  RD was consulted by MD given pt has had poor PO. Per review of PO documentation, pt is not consuming much or any of his meals. Will order Ensure supplements and daily MVI while PO is down.   Per weight records, no weight since admission on 4/5.  184 lbs then. No other weights but was on Lasix for diuresis earlier in admission. Will order daily weights to follow weight trends.  Medications: Vitamin D, KLOR-CON  Labs reviewed.   NUTRITION - FOCUSED PHYSICAL EXAM:  Unable to complete  Diet Order:   Diet Order             Diet Heart Room service  appropriate? Yes; Fluid consistency: Thin  Diet effective now                   EDUCATION NEEDS:   Not appropriate for education at this time  Skin:  Skin Assessment: Reviewed RN Assessment  Last BM:  4/11 -type 6  Height:   Ht Readings from Last 1 Encounters:  04/30/23 5\' 6"  (1.676 m)    Weight:   Wt Readings from Last 1 Encounters:  04/30/23 83.5 kg    BMI:  Body mass index is 29.71 kg/m.  Estimated Nutritional Needs:   Kcal:  1850-2050  Protein:  85-100g  Fluid:  1.8L/day   Tilda Franco, MS, RD, LDN Inpatient Clinical Dietitian Contact via Secure chat

## 2023-05-06 NOTE — Progress Notes (Signed)
 Patient Name: Patrick Villa Date of Encounter: 05/06/2023 [facility] Cardiologist: Patrick Red, MD   Interval Summary  .    Feeling tired.  No CP/SOB.   Vital Signs .    Vitals:   05/06/23 0700 05/06/23 0800 05/06/23 0855 05/06/23 0929  BP:      Pulse:      Resp:      Temp:      TempSrc:      SpO2: 91% (!) 88% 90% 92%  Weight:      Height:        Intake/Output Summary (Last 24 hours) at 05/06/2023 1117 Last data filed at 05/06/2023 0857 Gross per 24 hour  Intake 553.71 ml  Output 1500 ml  Net -946.29 ml      04/30/2023    6:13 PM 04/30/2023   12:34 PM 04/26/2023   12:46 PM  Last 3 Weights  Weight (lbs) 184 lb 1.4 oz 184 lb 1.4 oz 184 lb  Weight (kg) 83.5 kg 83.5 kg 83.462 kg      Telemetry/ECG    Sinus rhythm.  Sinus tachycardia.  PVCs - Personally Reviewed  Physical Exam .    VS:  BP 136/71 (BP Location: Left Arm)   Pulse 94   Temp 100 F (37.8 C) (Axillary)   Resp 16   Ht 5\' 6"  (1.676 m)   Wt 83.5 kg   SpO2 92%   BMI 29.71 kg/m  , BMI Body mass index is 29.71 kg/m. GENERAL:  No acute distress.  HEENT: Pupils equal round and reactive, fundi not visualized, oral mucosa unremarkable NECK:  No jugular venous distention, waveform within normal limits, carotid upstroke brisk and symmetric, no bruits, no thyromegaly LUNGS:  Bilateral rhonchi HEART:  RRR.  PMI not displaced or sustained,S1 and S2 within normal limits, no S3, no S4, no clicks, no rubs, no murmurs ABD:  Flat, positive bowel sounds normal in frequency in pitch, no bruits, no rebound, no guarding, no midline pulsatile mass, no hepatomegaly, no splenomegaly EXT:  2 plus pulses throughout, no edema, no cyanosis no clubbing SKIN:  No rashes no nodules NEURO:  Cranial nerves II through XII grossly intact, motor grossly intact throughout PSYCH:  Cognitively intact, oriented to person place and time   Assessment & Plan .     Patrick Villa is an 85M with hypertension, hyperlipidemia,  aortic atherosclerosis, coronary calcification admitted with fever and lethargy.  Found to have reduced systolic function and multifocal pneumonia.    # Hypoxia: # Multifocal pneumonia:  Feeling much better.  Breathing continues to improve. On broad antibiotic coverage per primary team.    # Heart failure with moderately reduced EF, acute: LVEF estimation is 40-45%.  This is new.  LVEF was previously normal.  Global hypokinesis.  Multiple possible etiologies.  This could be due to hypertension, though he only has mild LVH.  He reports average blood pressures at home have been in the 140s. Renal function worsened with diuresis and he now appears to be euvolemic.  He didn't tolerate spironolactone.  We added eplerenone and BP is better but renal function worse.  Recommend holding eplerenone for now.  Can resume when renal function at baseline.   #AKI:  He has a poor appetite and was diuresed.  Renal function continues to climb.  Will give very gentle 250 mL fluid bolus.   # CAD:  # Hyperlipidemia:  On review of his CTA he does have diffuse coronary and aortic atherosclerosis.  His cardiomyopathy could be ischemic, though he has not had any ischemic symptoms.  Stress testing was negative in 2021. Given his pneumonia and lack of ischemic symptoms, will defer ischemic evaluation to outpatient setting.  Repeat echo and ischemic evaluation if LVEF remains depressed.     # Mobitz I: Avoid nodal agents.   # Hyperlipidemia: LDL goal <70.   For questions or updates, please contact [facility] Please consult [email or web address] for contact info under        Signed, Patrick Si, MD

## 2023-05-06 NOTE — Plan of Care (Signed)
 Pt very confusion overnight with desats. In hand mitt upon am assessment Unable to wean oxygen, requiring more at rest Desats with turning in bed Plan to transfer to stepdown

## 2023-05-06 NOTE — Progress Notes (Signed)
 PT Cancellation Note  Patient Details Name: Patrick Villa MRN: [id number] DOB: 12/13/1933   Cancelled Treatment:     Per RN, Pt not stable for Physical Therapy today.  Pt is de stating with just rolling/bed change.   Felecia Shelling  PTA Acute  Rehabilitation Services Office M-F          [phone]

## 2023-05-06 NOTE — Progress Notes (Signed)
 eLink Physician-Brief Progress Note Patient Name: Patrick Villa DOB: [birth year] MRN: [id number]   Date of Service  05/06/2023  HPI/Events of Note  Patient is without enteral access and has medications due.  eICU Interventions  Oral Doxycycline  and Doxazocin held tonight. Doxycyline 100 mg iv ordered for tonight.        Thomasene Lot [name] 05/06/2023, 10:55 PM

## 2023-05-07 ENCOUNTER — Inpatient Hospital Stay (HOSPITAL_COMMUNITY)

## 2023-05-07 DIAGNOSIS — N401 Enlarged prostate with lower urinary tract symptoms: Secondary | ICD-10-CM | POA: Diagnosis not present

## 2023-05-07 DIAGNOSIS — G9340 Encephalopathy, unspecified: Secondary | ICD-10-CM | POA: Diagnosis not present

## 2023-05-07 DIAGNOSIS — J9601 Acute respiratory failure with hypoxia: Secondary | ICD-10-CM | POA: Diagnosis not present

## 2023-05-07 DIAGNOSIS — I5021 Acute systolic (congestive) heart failure: Secondary | ICD-10-CM | POA: Diagnosis not present

## 2023-05-07 DIAGNOSIS — N179 Acute kidney failure, unspecified: Secondary | ICD-10-CM | POA: Diagnosis not present

## 2023-05-07 DIAGNOSIS — J189 Pneumonia, unspecified organism: Secondary | ICD-10-CM | POA: Diagnosis not present

## 2023-05-07 DIAGNOSIS — I700 Atherosclerosis of aorta: Secondary | ICD-10-CM | POA: Diagnosis not present

## 2023-05-07 LAB — CBC
HCT: 34.7 % — ABNORMAL LOW (ref 39.0–52.0)
Hemoglobin: 10.2 g/dL — ABNORMAL LOW (ref 13.0–17.0)
MCH: 26.9 pg (ref 26.0–34.0)
MCHC: 29.4 g/dL — ABNORMAL LOW (ref 30.0–36.0)
MCV: 91.6 fL (ref 80.0–100.0)
Platelets: 276 10*3/uL (ref 150–400)
RBC: 3.79 MIL/uL — ABNORMAL LOW (ref 4.22–5.81)
RDW: 14 % (ref 11.5–15.5)
WBC: 13.5 10*3/uL — ABNORMAL HIGH (ref 4.0–10.5)
nRBC: 0.4 % — ABNORMAL HIGH (ref 0.0–0.2)

## 2023-05-07 LAB — GLUCOSE, CAPILLARY
Glucose-Capillary: 130 mg/dL — ABNORMAL HIGH (ref 70–99)
Glucose-Capillary: 153 mg/dL — ABNORMAL HIGH (ref 70–99)
Glucose-Capillary: 167 mg/dL — ABNORMAL HIGH (ref 70–99)

## 2023-05-07 LAB — BASIC METABOLIC PANEL WITH GFR
Anion gap: 8 (ref 5–15)
BUN: 62 mg/dL — ABNORMAL HIGH (ref 8–23)
CO2: 27 mmol/L (ref 22–32)
Calcium: 9.4 mg/dL (ref 8.9–10.3)
Chloride: 110 mmol/L (ref 98–111)
Creatinine, Ser: 2.22 mg/dL — ABNORMAL HIGH (ref 0.61–1.24)
GFR, Estimated: 28 mL/min — ABNORMAL LOW (ref >60)
Glucose, Bld: 156 mg/dL — ABNORMAL HIGH (ref 70–99)
Potassium: 4.9 mmol/L (ref 3.5–5.1)
Sodium: 145 mmol/L (ref 135–145)

## 2023-05-07 LAB — C-REACTIVE PROTEIN: CRP: 3.3 mg/dL — ABNORMAL HIGH (ref <1.0)

## 2023-05-07 LAB — TRIGLYCERIDES: Triglycerides: 335 mg/dL — ABNORMAL HIGH (ref <150)

## 2023-05-07 LAB — SEDIMENTATION RATE: Sed Rate: 82 mm/h — ABNORMAL HIGH (ref 0–16)

## 2023-05-07 MED ORDER — GUAIFENESIN-DM 100-10 MG/5ML PO SYRP
5.0000 mL | ORAL_SOLUTION | ORAL | Status: DC | PRN
Start: 2023-05-07 — End: 2023-05-11

## 2023-05-07 MED ORDER — THIAMINE MONONITRATE 100 MG PO TABS
100.0000 mg | ORAL_TABLET | Freq: Every day | ORAL | Status: DC
Start: 2023-05-07 — End: 2023-05-11
  Administered 2023-05-07 – 2023-05-10 (×4): 100 mg
  Filled 2023-05-07 (×4): qty 1

## 2023-05-07 MED ORDER — VITAMIN D 25 MCG (1000 UNIT) PO TABS
2000.0000 [IU] | ORAL_TABLET | Freq: Every day | ORAL | Status: DC
Start: 2023-05-07 — End: 2023-05-11
  Administered 2023-05-07 – 2023-05-10 (×4): 2000 [IU]
  Filled 2023-05-07 (×4): qty 2

## 2023-05-07 MED ORDER — ISOSORBIDE MONONITRATE 10 MG PO TABS
10.0000 mg | ORAL_TABLET | Freq: Three times a day (TID) | ORAL | Status: DC
  Administered 2023-05-07 – 2023-05-09 (×7): 10 mg
  Filled 2023-05-07 (×8): qty 1

## 2023-05-07 MED ORDER — ASPIRIN 81 MG PO CHEW
81.0000 mg | CHEWABLE_TABLET | Freq: Every day | ORAL | Status: DC
  Administered 2023-05-07 – 2023-05-10 (×4): 81 mg
  Filled 2023-05-07 (×4): qty 1

## 2023-05-07 MED ORDER — ADULT MULTIVITAMIN W/MINERALS CH
1.0000 | ORAL_TABLET | Freq: Every day | ORAL | Status: DC
  Administered 2023-05-08 – 2023-05-10 (×3): 1
  Filled 2023-05-07 (×4): qty 1

## 2023-05-07 MED ORDER — ACETAMINOPHEN 325 MG PO TABS
650.0000 mg | ORAL_TABLET | Freq: Four times a day (QID) | ORAL | Status: DC | PRN
  Administered 2023-05-08 (×2): 650 mg
  Filled 2023-05-07 (×2): qty 2

## 2023-05-07 MED ORDER — PROSOURCE TF20 ENFIT COMPATIBL EN LIQD
60.0000 mL | Freq: Every day | ENTERAL | Status: DC
Start: 2023-05-07 — End: 2023-05-09
  Administered 2023-05-07 – 2023-05-09 (×3): 60 mL
  Filled 2023-05-07 (×3): qty 60

## 2023-05-07 MED ORDER — HYDRALAZINE HCL 50 MG PO TABS
50.0000 mg | ORAL_TABLET | Freq: Three times a day (TID) | ORAL | Status: DC
  Administered 2023-05-08 – 2023-05-09 (×4): 50 mg
  Filled 2023-05-07 (×4): qty 1

## 2023-05-07 MED ORDER — DOXAZOSIN MESYLATE 1 MG PO TABS
2.0000 mg | ORAL_TABLET | Freq: Two times a day (BID) | ORAL | Status: DC
  Administered 2023-05-07 – 2023-05-09 (×4): 2 mg
  Filled 2023-05-07 (×4): qty 2

## 2023-05-07 MED ORDER — DOXYCYCLINE HYCLATE 100 MG PO TABS
100.0000 mg | ORAL_TABLET | Freq: Two times a day (BID) | ORAL | Status: DC
  Administered 2023-05-08 – 2023-05-10 (×6): 100 mg
  Filled 2023-05-07 (×6): qty 1

## 2023-05-07 MED ORDER — VITAL 1.5 CAL PO LIQD
1000.0000 mL | ORAL | Status: DC
  Administered 2023-05-07 – 2023-05-08 (×2): 1000 mL
  Filled 2023-05-07 (×4): qty 1000

## 2023-05-07 NOTE — Plan of Care (Signed)
  Problem: Education: Goal: Knowledge of General Education information will improve Description: Including pain rating scale, medication(s)/side effects and non-pharmacologic comfort measures Outcome: Progressing   Problem: Health Behavior/Discharge Planning: Goal: Ability to manage health-related needs will improve Outcome: Progressing   Problem: Clinical Measurements: Goal: Ability to maintain clinical measurements within normal limits will improve Outcome: Progressing Goal: Will remain free from infection Outcome: Progressing Goal: Diagnostic test results will improve Outcome: Progressing Goal: Respiratory complications will improve Outcome: Progressing Goal: Cardiovascular complication will be avoided Outcome: Progressing   Problem: Activity: Goal: Risk for activity intolerance will decrease Outcome: Progressing   Problem: Coping: Goal: Level of anxiety will decrease Outcome: Progressing   Problem: Elimination: Goal: Will not experience complications related to urinary retention Outcome: Progressing   Problem: Pain Managment: Goal: General experience of comfort will improve and/or be controlled Outcome: Progressing   Problem: Safety: Goal: Ability to remain free from injury will improve Outcome: Progressing   Problem: Skin Integrity: Goal: Risk for impaired skin integrity will decrease Outcome: Progressing   Problem: Activity: Goal: Ability to tolerate increased activity will improve Outcome: Progressing   Problem: Clinical Measurements: Goal: Ability to maintain a body temperature in the normal range will improve Outcome: Progressing   Problem: Respiratory: Goal: Ability to maintain adequate ventilation will improve Outcome: Progressing Goal: Ability to maintain a clear airway will improve Outcome: Progressing   Problem: Education: Goal: Understanding of CV disease, CV risk reduction, and recovery process will improve Outcome: Progressing Goal:  Individualized Educational Video(s) Outcome: Progressing   Problem: Activity: Goal: Ability to return to baseline activity level will improve Outcome: Progressing   Problem: Cardiovascular: Goal: Ability to achieve and maintain adequate cardiovascular perfusion will improve Outcome: Progressing Goal: Vascular access site(s) Level 0-1 will be maintained Outcome: Progressing   Problem: Health Behavior/Discharge Planning: Goal: Ability to safely manage health-related needs after discharge will improve Outcome: Progressing   Problem: Activity: Goal: Ability to tolerate increased activity will improve Outcome: Progressing   Problem: Respiratory: Goal: Ability to maintain a clear airway and adequate ventilation will improve Outcome: Progressing   Problem: Role Relationship: Goal: Method of communication will improve Outcome: Progressing

## 2023-05-07 NOTE — Progress Notes (Signed)
 Tried to place OG placed x2. Unsuccessful. Another RN attempted x 2. Dr. Dione Franks notified.

## 2023-05-07 NOTE — Progress Notes (Signed)
   Patient Name: Patrick Villa Date of Encounter: 05/07/2023 [facility] Cardiologist: Sheryle Donning, MD   Interval Summary  .    Yesterday evening he was transferred to the ICU for worsening encephalopathy and hypoxemic respiratory failure and eventually underwent endotracheal intubation and bronchoscopy with BAL.  He is currently sedated with propofol and fentanyl and is hemodynamically stable.  Vital Signs .    Vitals:   05/07/23 0731 05/07/23 0756 05/07/23 0800 05/07/23 0829  BP: 115/64  (!) 97/55   Pulse: 81 80 79   Resp: 18 20 18    Temp:      TempSrc:      SpO2: 97% 96% 96% 96%  Weight:      Height:        Intake/Output Summary (Last 24 hours) at 05/07/2023 0844 Last data filed at 05/07/2023 0709 Gross per 24 hour  Intake 878.79 ml  Output 850 ml  Net 28.79 ml      05/07/2023    6:00 AM 05/06/2023   12:17 PM 04/30/2023    6:13 PM  Last 3 Weights  Weight (lbs) 152 lb 8.9 oz 157 lb 10.1 oz 184 lb 1.4 oz  Weight (kg) 69.2 kg 71.5 kg 83.5 kg      Telemetry/ECG    Normal sinus rhythm with occasional PVCs- Personally Reviewed  Physical Exam .   GEN: Intubated/sedated/mechanically ventilated Neck: No JVD Cardiac: Occasional ectopy on background of RRR, no murmurs, rubs, or gallops.  Respiratory: Clear to auscultation bilaterally. GI: Soft, nontender, non-distended  MS: No edema  Assessment & Plan .     89 year old man with HTN, HLP, aortic atherosclerosis and coronary calcification without angina or clinically evident CAD, newly diagnosed LV systolic dysfunction (EF 40-45%, global hypokinesis), second-degree Mobitz type I AV block, admitted with fever and lethargy and multifocal pneumonia, subsequently developed acute respiratory failure requiring endotracheal intubation 05/06/2023, worsening acute renal insufficiency following diuresis.  Acute respiratory failure with hypoxia: Resented with fever, assumed to have infectious etiology.  Had BAL  yesterday.  Continue broad-spectrum antibiotics. CHF: Newly diagnosed depressed LV systolic function during this admission.  The cause of his cardiomyopathy remains unclear.  Obviously, ischemic cardiomyopathy is a leading possibility given his age and the presence of atherosclerotic findings on CT imaging, but he has never had angina and currently is not a candidate for invasive coronary evaluation.  Current blood pressure and worsening renal function does not allow titration of heart failure medications.  Avoid AV nodal blocking agents including beta-blockers due to history of second-degree AV block. Will plan on reevaluating LVEF after his acute respiratory illness improves. AKI: Diuretics stopped.  Worsening renal parameters today. CRITICAL CARE Performed by: Karyl Paget Jayko Voorhees   Total critical care time: 35 minutes  Critical care time was exclusive of separately billable procedures and treating other patients.  Critical care was necessary to treat or prevent imminent or life-threatening deterioration.  Critical care was time spent personally by me on the following activities: development of treatment plan with patient and/or surrogate as well as nursing, discussions with consultants, evaluation of patient's response to treatment, examination of patient, obtaining history from patient or surrogate, ordering and performing treatments and interventions, ordering and review of laboratory studies, ordering and review of radiographic studies, pulse oximetry and re-evaluation of patient's condition.   For questions or updates, please contact [facility] Please consult [email or web address] for contact info under        Signed, Luana Rumple, MD  Evaluation

## 2023-05-07 NOTE — Progress Notes (Signed)
 Nutrition Follow-up / Consult  DOCUMENTATION CODES:      INTERVENTION:   Initiate tube feeding via NG tube: Vital 1.5 at 20 ml/h, increase by 10 ml every 8 hours to goal rate of 50 ml/h (1200 ml per day). Prosource TF20 60 ml once daily. Provides 1880 kcal, 101 gm protein, 917 ml free water daily.  Monitor magnesium, potassium, and phosphorus BID for at least 3 days, MD to replete as needed, as pt is at risk for refeeding syndrome given minimal intake since admission. Add Thiamine 100 mg daily for 7 days  D/C Ensure.  Continue MVI with minerals daily via tube.   NUTRITION DIAGNOSIS:   Increased nutrient needs related to chronic illness, acute illness as evidenced by estimated needs.  Ongoing   GOAL:   Patient will meet greater than or equal to 90% of their needs  Progressing   MONITOR:   PO intake, Supplement acceptance  REASON FOR ASSESSMENT:   Consult Enteral/tube feeding initiation and management  ASSESSMENT:   89 year old male with history of COPD, diabetes mellitus, hyperlipidemia, hypertension, BPH who presented to the device department with complaint of generalized weakness, shortness of breath, nonproductive cough, fever.  On presentation,he was hypoxic at 87%.  He was recently treated for multifocal pneumonia with Augmentin and doxycycline as an outpatient but continued to have symptoms.    Chest x-ray showed patchy airspace opacity with bilateral pleural effusion, possible mild pulmonary edema.  Patient admitted for further management of multifocal pneumonia not responding to outpatient antibiotic therapy.  Patient required intubation 4/11.  S/P BAL 4/11. Received MD Consult for TF initiation and management. NG tube in place.  Patient is at increased risk for refeeding syndrome d/t minimal intake since admission.    Patient is currently intubated on ventilator support MV: 6.9 L/min Temp (24hrs), Avg:99.9 F (37.7 C), Min:98.2 F (36.8 C), Max:100.8 F  (38.2 C)  Propofol: 10.7 ml/hr providing 282 kcal daily.  Labs reviewed. Triglycerides 335 CBG: 130  Medications reviewed and include cholecalciferol, colace, pepcid, solu-medrol, MVI with minerals, fentanyl, propofol.  Admit weight 83.5 kg (4/5) Current weight 69.2 kg Question accuracy of weights.   I/O -2.1 L since admission  Diet Order:   Diet Order             Diet NPO time specified  Diet effective now                   EDUCATION NEEDS:   Not appropriate for education at this time  Skin:  Skin Assessment: Reviewed RN Assessment  Last BM:  4/11 -type 6  Height:   Ht Readings from Last 1 Encounters:  05/07/23 5\' 6"  (1.676 m)    Weight:   Wt Readings from Last 1 Encounters:  05/07/23 69.2 kg    BMI:  Body mass index is 24.62 kg/m.  Estimated Nutritional Needs:   Kcal:  1800-2000  Protein:  85-105 gm  Fluid:  1.8-2 L   Barnet Boots RD, LDN, CNSC Contact via secure chat. If unavailable, use group chat "RD Inpatient."

## 2023-05-07 NOTE — Progress Notes (Signed)
 NAME:  Patrick Villa, MRN:  [id number], DOB:  December 31, 1933, LOS: 6 ADMISSION DATE:  04/30/2023, CONSULTATION DATE:  4/8 REFERRING MD:  Jenelle Mis, CHIEF COMPLAINT:  hypoxia   History of Present Illness:  Mr. Brunkhorst is an 89 y/o gentleman with a history of COPD, type 2 diabetes who presented with weakness, shortness of breath that are worse on the day of presentation but had been present for about 2 weeks.  In the emergency department he was saturating 87%.  Prior to presentation to the hospital he had been treated with doxycycline and Augmentin as an outpatient.  He was admitted for low saturations and failure of outpatient antibiotics.  Since admission he has been continued on azithromycin and ceftriaxone.  He was on 2 L nasal cannula from 4/5 until 4/7 when he jumped up to 7 L oxygen in the afternoon on 4/7. Today he feels ok; overall he reports feeling improved since admission. His nurse is concerning with how quickly his oxygen needs have risen in the past day. He denies reflux/ GERD symptoms and denies aspiration when eating/ drinking.  No family history of lung disease. He did not know he had COPD and is not on inhalers PTA. He quit smoking in the 80s after ~1ppd x ~40 years. He worked as a Technical sales engineer.   Pertinent  Medical History  HLD HTN DM COPD  Significant Hospital Events: Including procedures, antibiotic start and stop dates in addition to other pertinent events   4/5 admitted for pneumonia, started ceftriaxone and azithromycin 4/7 increasing oxygen requirements, started steroids after CT showed diffuse GG. Antibiotics escalated to cover HAP. 4/11 progressive respiratory failure, transferred to ICU, intubated, bronchoscopy   Interim History / Subjective:  Doing better on propofol and fentanyl overnight. Low grade fevers.  Objective   Blood pressure 112/60, pulse 80, temperature (!) 100.4 F (38 C), temperature source Axillary, resp. rate 17, height 5\' 6"  (1.676 m), weight 69.2 kg,  SpO2 96%.    Vent Mode: PRVC FiO2 (%):  [60 %-100 %] 60 % Set Rate:  [14 bmp-18 bmp] 18 bmp Vt Set:  [380 mL-510 mL] 380 mL PEEP:  [5 cmH20-10 cmH20] 10 cmH20 Plateau Pressure:  [15 cmH20-27 cmH20] 18 cmH20   Intake/Output Summary (Last 24 hours) at 05/07/2023 4098 Last data filed at 05/07/2023 0800 Gross per 24 hour  Intake 901.81 ml  Output 650 ml  Net 251.81 ml   Filed Weights   04/30/23 1813 05/06/23 1217 05/07/23 0600  Weight: 83.5 kg 71.5 kg 69.2 kg    Examination: Gen:      Intubated, sedated, acutely ill appearing, elderly HEENT:  ETT to vent Lungs:    sounds of mechanical ventilation auscultated no wheezes or crackles CV:         RRR Abd:      + bowel sounds; soft, non-tender; no palpable masses, no distension Ext:    No edema Skin:      Warm and dry; no rashes Neuro:   sedated, RASS -2  Chestxray shows RLL>LLL patchy opacities   BAL shows - unremarkable cell count  Resolved Hospital Problem list     Assessment & Plan:  Acute respiratory failure with hypoxia Differential diagnosis includes some kind of AIP including acute HP, organizing pneumonia, viral/atypical infection.  Less likely acute pulmonary edema given downtrending BNP, physical exam without signs of volume overload - lung protective ventilation, daily SBT/SAT assessment, vap bundle - clinically euvolemic, viral 20 panel negative, as are flu, covid, rsv. Mrsa  negative. Blood cultures negative. Strep pneumoniae negative. Legionella in process Continue empiric abx - sending autoimmune serologies for work up  Acute encephalopathy Delirium from hospital stay, hypoxemia, age and steroid use - moving to step down per primary  BPH Doxazosin and finasteride foley   Best Practice (right click and "Reselect all SmartList Selections" daily)   Diet/type: NPO. Reattempt OG tube today to start tube feeds DVT prophylaxis prophylactic heparin  Pressure ulcer(s): pressure ulcer assessment deferred  GI  prophylaxis: H2B Lines: N/A Foley:  Yes, and it is still needed Code Status:  full code Last date of multidisciplinary goals of care discussion [daughter updated at bedside with patient's wife. For now continue full code. Discussed time limited trial of  life support for reversibility. Then may need to continue comfort measures.]  Labs   CBC: Recent Labs  Lab 04/30/23 1350 05/01/23 0515 05/03/23 0525 05/04/23 0447 05/07/23 0735  WBC 6.5 6.3 6.8 10.4 13.5*  NEUTROABS 5.2 4.6  --   --   --   HGB 10.9* 10.5* 11.9* 11.5* 10.2*  HCT 34.0* 34.1* 39.4 36.0* 34.7*  MCV 83.1 87.4 86.8 85.1 91.6  PLT 270 232 293 277 276    Basic Metabolic Panel: Recent Labs  Lab 05/01/23 0515 05/02/23 0446 05/03/23 0525 05/04/23 0447 05/05/23 0450 05/06/23 0406 05/07/23 0735  NA 140   < > 137 136 141 142 145  K 3.1*   < > 3.3* 3.0* 3.2* 4.0 4.9  CL 105   < > 101 96* 104 109 110  CO2 24   < > 25 27 26 23 27   GLUCOSE 96   < > 125* 155* 150* 147* 156*  BUN 21   < > 28* 37* 45* 56* 62*  CREATININE 1.18   < > 1.38* 1.78* 1.88* 1.97* 2.22*  CALCIUM 9.0   < > 9.3 9.1 9.3 9.6 9.4  MG 1.7  --  2.3  --   --   --   --    < > = values in this interval not displayed.   GFR: Estimated Creatinine Clearance: 21.2 mL/min (A) (by C-G formula based on SCr of 2.22 mg/dL (H)). Recent Labs  Lab 04/30/23 1350 04/30/23 1537 05/01/23 0515 05/03/23 0525 05/04/23 0447 05/07/23 0735  WBC 6.5  --  6.3 6.8 10.4 13.5*  LATICACIDVEN 1.5 1.0  --   --   --   --     Liver Function Tests: Recent Labs  Lab 04/30/23 1350 05/01/23 0515  AST 50* 61*  ALT 29 35  ALKPHOS 52 49  BILITOT 0.4 0.5  PROT 7.0 6.2*  ALBUMIN 3.4* 2.4*

## 2023-05-08 ENCOUNTER — Inpatient Hospital Stay (HOSPITAL_COMMUNITY)

## 2023-05-08 DIAGNOSIS — Z8679 Personal history of other diseases of the circulatory system: Secondary | ICD-10-CM

## 2023-05-08 DIAGNOSIS — N401 Enlarged prostate with lower urinary tract symptoms: Secondary | ICD-10-CM | POA: Diagnosis not present

## 2023-05-08 DIAGNOSIS — I5021 Acute systolic (congestive) heart failure: Secondary | ICD-10-CM | POA: Diagnosis not present

## 2023-05-08 DIAGNOSIS — G9340 Encephalopathy, unspecified: Secondary | ICD-10-CM | POA: Diagnosis not present

## 2023-05-08 DIAGNOSIS — J189 Pneumonia, unspecified organism: Secondary | ICD-10-CM | POA: Diagnosis not present

## 2023-05-08 DIAGNOSIS — J9601 Acute respiratory failure with hypoxia: Secondary | ICD-10-CM | POA: Diagnosis not present

## 2023-05-08 LAB — BASIC METABOLIC PANEL WITH GFR
Anion gap: 11 (ref 5–15)
BUN: 101 mg/dL — ABNORMAL HIGH (ref 8–23)
CO2: 24 mmol/L (ref 22–32)
Calcium: 9 mg/dL (ref 8.9–10.3)
Chloride: 109 mmol/L (ref 98–111)
Creatinine, Ser: 3.25 mg/dL — ABNORMAL HIGH (ref 0.61–1.24)
GFR, Estimated: 18 mL/min — ABNORMAL LOW (ref >60)
Glucose, Bld: 185 mg/dL — ABNORMAL HIGH (ref 70–99)
Potassium: 5 mmol/L (ref 3.5–5.1)
Sodium: 144 mmol/L (ref 135–145)

## 2023-05-08 LAB — GLUCOSE, CAPILLARY
Glucose-Capillary: 157 mg/dL — ABNORMAL HIGH (ref 70–99)
Glucose-Capillary: 161 mg/dL — ABNORMAL HIGH (ref 70–99)
Glucose-Capillary: 163 mg/dL — ABNORMAL HIGH (ref 70–99)
Glucose-Capillary: 167 mg/dL — ABNORMAL HIGH (ref 70–99)
Glucose-Capillary: 214 mg/dL — ABNORMAL HIGH (ref 70–99)
Glucose-Capillary: 222 mg/dL — ABNORMAL HIGH (ref 70–99)
Glucose-Capillary: 236 mg/dL — ABNORMAL HIGH (ref 70–99)

## 2023-05-08 LAB — ANTI-DNA ANTIBODY, DOUBLE-STRANDED: ds DNA Ab: <1 [IU]/mL (ref 0–9)

## 2023-05-08 LAB — ANA: Anti Nuclear Antibody (ANA): NEGATIVE

## 2023-05-08 LAB — PHOSPHORUS
Phosphorus: 6.7 mg/dL — ABNORMAL HIGH (ref 2.5–4.6)
Phosphorus: 7.3 mg/dL — ABNORMAL HIGH (ref 2.5–4.6)

## 2023-05-08 LAB — MAGNESIUM
Magnesium: 3.1 mg/dL — ABNORMAL HIGH (ref 1.7–2.4)
Magnesium: 3.2 mg/dL — ABNORMAL HIGH (ref 1.7–2.4)

## 2023-05-08 LAB — LEGIONELLA PNEUMOPHILA SEROGP 1 UR AG: L. pneumophila Serogp 1 Ur Ag: NEGATIVE

## 2023-05-08 LAB — RHEUMATOID FACTOR: Rheumatoid fact SerPl-aCnc: 10.9 [IU]/mL (ref <14.0)

## 2023-05-08 LAB — ANTI-SCLERODERMA ANTIBODY: Scleroderma (Scl-70) (ENA) Antibody, IgG: <0.2 AI (ref 0.0–0.9)

## 2023-05-08 MED ORDER — INSULIN ASPART 100 UNIT/ML IJ SOLN
0.0000 [IU] | INTRAMUSCULAR | Status: DC
  Administered 2023-05-08 – 2023-05-09 (×6): 5 [IU] via SUBCUTANEOUS
  Administered 2023-05-09: 8 [IU] via SUBCUTANEOUS
  Administered 2023-05-09: 5 [IU] via SUBCUTANEOUS
  Administered 2023-05-10: 3 [IU] via SUBCUTANEOUS
  Administered 2023-05-10 (×3): 5 [IU] via SUBCUTANEOUS
  Administered 2023-05-10 (×2): 3 [IU] via SUBCUTANEOUS

## 2023-05-08 MED ORDER — PIPERACILLIN-TAZOBACTAM IN DEX 2-0.25 GM/50ML IV SOLN
2.2500 g | Freq: Four times a day (QID) | INTRAVENOUS | Status: DC
  Administered 2023-05-08 – 2023-05-09 (×3): 2.25 g via INTRAVENOUS
  Filled 2023-05-08 (×3): qty 50

## 2023-05-08 NOTE — Progress Notes (Signed)
 PHARMACY NOTE:  ANTIMICROBIAL RENAL DOSAGE ADJUSTMENT  Current antimicrobial regimen includes a mismatch between antimicrobial dosage and estimated renal function.  As per policy approved by the Pharmacy & Therapeutics and Medical Executive Committees, the antimicrobial dosage will be adjusted accordingly.  Current antimicrobial dosage:   Zosyn 3.375 g IV infused over 4 hours every 8 hours  Indication: HCAP  Renal Function:  Estimated Creatinine Clearance: 14.5 mL/min (A) (by C-G formula based on SCr of 3.25 mg/dL (H)). []      On intermittent HD, scheduled: []      On CRRT    Antimicrobial dosage has been changed to:   Zosyn 2.25 g IV every 6 hours  Additional comments:   Thank you for allowing pharmacy to be a part of this patient's care.  Dylynn Ketner, [facility] 05/08/2023 3:12 PM

## 2023-05-08 NOTE — Progress Notes (Signed)
   05/08/23 1528  Adult Ventilator Settings  FiO2 (%) (S)  50 % (Weaned to 45% and peep 8, pt's Sp02 decreased to 91% rapidly, increased back to 50% and 10 peep, 94% Sp02.)

## 2023-05-08 NOTE — Plan of Care (Signed)
  Problem: Clinical Measurements: Goal: Ability to maintain clinical measurements within normal limits will improve Outcome: Progressing   Problem: Clinical Measurements: Goal: Will remain free from infection Outcome: Not Progressing Goal: Diagnostic test results will improve Outcome: Not Progressing

## 2023-05-08 NOTE — Progress Notes (Signed)
 Patient Name: Patrick Villa Date of Encounter: 05/08/2023 [facility] Cardiologist: Sheryle Donning, MD   Interval Summary  .    Intubated.  Sedated.  No significant events overnight. Still febrile 100.9 F earlier this morning. No chemistry labs yet today. His daughter, Lavern Potash at bedside.  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  Vital Signs .    Vitals:   05/08/23 0557 05/08/23 0600 05/08/23 0700 05/08/23 0706  BP:  130/67 128/63   Pulse: 87 88 89   Resp: 18 18 17    Temp:    (!) 100.9 F (38.3 C)  TempSrc:    Axillary  SpO2: 96% 96% 94%   Weight:      Height:        Intake/Output Summary (Last 24 hours) at 05/08/2023 0845 Last data filed at 05/08/2023 0801 Gross per 24 hour  Intake 1462.19 ml  Output 875 ml  Net 587.19 ml      05/08/2023    5:00 AM 05/07/2023    6:00 AM 05/06/2023   12:17 PM  Last 3 Weights  Weight (lbs) 151 lb 7.3 oz 152 lb 8.9 oz 157 lb 10.1 oz  Weight (kg) 68.7 kg 69.2 kg 71.5 kg      Telemetry/ECG    Sinus rhythm with occasional PVCs, no evidence of AV block- Personally Reviewed  Physical Exam .   GEN: No acute distress.  Intubated/sedated Neck: No JVD Cardiac: RRR, no murmurs, rubs, or gallops.  Respiratory: Clear to auscultation bilaterally. GI: Soft, nontender, non-distended  MS: No edema  Assessment & Plan .     89 year old man with HTN, HLP, aortic atherosclerosis and coronary calcification without angina or clinically evident CAD, newly diagnosed LV systolic dysfunction (EF 40-45%, global hypokinesis), second-degree Mobitz type I AV block, admitted with fever and lethargy and multifocal pneumonia, subsequently developed acute respiratory failure requiring endotracheal intubation 05/06/2023, worsening acute renal insufficiency following diuresis.   Acute respiratory failure with  hypoxia: On broad-spectrum antibiotics.  Had BAL yesterday.  Still febrile.  Ventilator with moderate O2 requirements (FiO2 0.55). CHF: Newly diagnosed depressed left ventricular systolic function during this admission.  May have ischemic cardiomyopathy in view of his age and the noted aortic and coronary atherosclerotic calcifications on CT imaging, although he has no history of angina.  Currently not a candidate for invasive evaluation.  Reevaluate LVEF after resolution of critical illness.  Currently on hydralazine/nitrates.  Not on RAAS inhibitors due to acute renal insufficiency.  Not on beta-blockers due to history of atrial ventricular block. Coronary calcification/arctic atherosclerosis: Goal LDL less than 70.  Will address once acute illness is over. Second-degree AV block: Was noted during treatment with beta-blockers, and has not been an issue during this hospitalization.  Avoid medications with negative chronotropic effect. HTN: Blood pressures currently in normal range.  Previously on chronic treatment with hydralazine and chlorthalidone and terazosin. AKI: Following diuretics.  Waiting for labs today.  Updated his daughter at the bedside.  She reports that they were unaware of the diagnosis of COPD.  The patient is not a smoker since 1983, only smoked off and on for about 10 years and he was never a daily smoker.  CRITICAL CARE Performed by: Karyl Paget Solei Wubben   Total critical care time: 40 minutes  Critical care time was exclusive of separately billable procedures  and treating other patients.  Critical care was necessary to treat or prevent imminent or life-threatening deterioration.  Critical care was time spent personally by me on the following activities: development of treatment plan with patient and/or surrogate as well as nursing, discussions with consultants, evaluation of patient's response to treatment, examination of patient, obtaining history from patient or surrogate,  ordering and performing treatments and interventions, ordering and review of laboratory studies, ordering and review of radiographic studies, pulse oximetry and re-evaluation of patient's condition.   For questions or updates, please contact [facility] Please consult [email or web address] for contact info under        Signed, Luana Rumple, MD

## 2023-05-08 NOTE — Progress Notes (Signed)
 NAME:  Patrick Villa, MRN:  [id number], DOB:  25-Nov-1933, LOS: 7 ADMISSION DATE:  04/30/2023, CONSULTATION DATE:  4/8 REFERRING MD:  Jenelle Mis, CHIEF COMPLAINT:  hypoxia   History of Present Illness:  Mr. Beggs is an 89 y/o gentleman with a history of COPD, type 2 diabetes who presented with weakness, shortness of breath that are worse on the day of presentation but had been present for about 2 weeks.  In the emergency department he was saturating 87%.  Prior to presentation to the hospital he had been treated with doxycycline and Augmentin as an outpatient.  He was admitted for low saturations and failure of outpatient antibiotics.  Since admission he has been continued on azithromycin and ceftriaxone.  He was on 2 L nasal cannula from 4/5 until 4/7 when he jumped up to 7 L oxygen in the afternoon on 4/7. Today he feels ok; overall he reports feeling improved since admission. His nurse is concerning with how quickly his oxygen needs have risen in the past day. He denies reflux/ GERD symptoms and denies aspiration when eating/ drinking.  No family history of lung disease. He did not know he had COPD and is not on inhalers PTA. He quit smoking in the 80s after ~1ppd x ~40 years. He worked as a Technical sales engineer.   Pertinent  Medical History  HLD HTN DM COPD  Significant Hospital Events: Including procedures, antibiotic start and stop dates in addition to other pertinent events   4/5 admitted for pneumonia, started ceftriaxone and azithromycin 4/7 increasing oxygen requirements, started steroids after CT showed diffuse GG. Antibiotics escalated to cover HAP. 4/11 progressive respiratory failure, transferred to ICU, intubated, bronchoscopy   Interim History / Subjective:  No overnight issues.   Objective   Blood pressure 128/63, pulse 89, temperature (!) 100.9 F (38.3 C), temperature source Axillary, resp. rate 17, height 5\' 6"  (1.676 m), weight 68.7 kg, SpO2 94%.    Vent Mode: PRVC FiO2 (%):   [55 %-60 %] 55 % Set Rate:  [18 bmp] 18 bmp Vt Set:  [380 mL] 380 mL PEEP:  [10 cmH20] 10 cmH20 Plateau Pressure:  [14 cmH20-18 cmH20] 18 cmH20   Intake/Output Summary (Last 24 hours) at 05/08/2023 1012 Last data filed at 05/08/2023 0857 Gross per 24 hour  Intake 1517.86 ml  Output 875 ml  Net 642.86 ml   Filed Weights   05/06/23 1217 05/07/23 0600 05/08/23 0500  Weight: 71.5 kg 69.2 kg 68.7 kg    Examination: Gen:      Intubated, sedated, elderly acutely ill appearing HEENT:  ETT to vent Lungs:    sounds of mechanical ventilation auscultated no wheeze CV:         RRR Abd:      + bowel sounds; soft, non-tender; no palpable masses, no distension Ext:    No edema Skin:      Warm and dry; no rashes Neuro:   sedated, RASS -3  Chest xray this am patchy multifocal airspace disease.  ETT in good position   BAL shows - unremarkable cell count Cultures negative Rvp 20 negative  Autoimmune serologies pending Elevated ESR and CRP  Resolved Hospital Problem list     Assessment & Plan:  Acute respiratory failure with hypoxia Differential diagnosis includes some kind of AIP including acute HP, organizing pneumonia, viral/atypical infection.  Less likely acute pulmonary edema given downtrending BNP, physical exam without signs of volume overload - lung protective ventilation, daily SBT/SAT assessment, vap bundle - clinically euvolemic,  viral 20 panel negative, as are flu, covid, rsv. Mrsa negative. Blood cultures negative. Strep pneumoniae negative. Legionella negative Continue empiric abx - autoimmune work up pending  Acute encephalopathy Delirium from hospital stay, hypoxemia, age and steroid use - propofol, fentanyl  BPH Doxazosin and finasteride foley   Best Practice (right click and "Reselect all SmartList Selections" daily)   Diet/type: tube feeds DVT prophylaxis prophylactic heparin  Pressure ulcer(s): pressure ulcer assessment deferred  GI prophylaxis:  H2B Lines: N/A Foley:  Yes, and it is still needed Code Status:  full code Last date of multidisciplinary goals of care discussion [daughter/wife updated at bedside daily. Discussed time limited trial of  life support and re-evaluating at the 72 hour mark.]  Labs   CBC: Recent Labs  Lab 05/03/23 0525 05/04/23 0447 05/07/23 0735  WBC 6.8 10.4 13.5*  HGB 11.9* 11.5* 10.2*  HCT 39.4 36.0* 34.7*  MCV 86.8 85.1 91.6  PLT 293 277 276    Basic Metabolic Panel: Recent Labs  Lab 05/03/23 0525 05/04/23 0447 05/05/23 0450 05/06/23 0406 05/07/23 0735 05/08/23 0516  NA 137 136 141 142 145  --   K 3.3* 3.0* 3.2* 4.0 4.9  --   CL 101 96* 104 109 110  --   CO2 25 27 26 23 27   --   GLUCOSE 125* 155* 150* 147* 156*  --   BUN 28* 37* 45* 56* 62*  --   CREATININE 1.38* 1.78* 1.88* 1.97* 2.22*  --   CALCIUM 9.3 9.1 9.3 9.6 9.4  --   MG 2.3  --   --   --   --  3.1*  PHOS  --   --   --   --   --  6.7*   GFR: Estimated Creatinine Clearance: 21.2 mL/min (A) (by C-G formula based on SCr of 2.22 mg/dL (H)). Recent Labs  Lab 05/03/23 0525 05/04/23 0447 05/07/23 0735  WBC 6.8 10.4 13.5*    Liver Function Tests: No results for input(s): "AST", "ALT", "ALKPHOS", "BILITOT", "PROT", "ALBUMIN" in the last 168 hours.   The patient is critically ill due to respiratory failure.  Critical care was necessary to treat or prevent imminent or life-threatening deterioration.  Critical care was time spent personally by me on the following activities: development of treatment plan with patient and/or surrogate as well as nursing, discussions with consultants, evaluation of patient's response to treatment, examination of patient, obtaining history from patient or surrogate, ordering and performing treatments and interventions, ordering and review of laboratory studies, ordering and review of radiographic studies, pulse oximetry, re-evaluation of patient's condition and participation in multidisciplinary  rounds.   Critical Care Time devoted to patient care services described in this note is 40 minutes. This time reflects time of care of this signee Jenay Morici S Beautiful Pensyl . This critical care time does not reflect separately billable procedures or procedure time, teaching time or supervisory time of PA/NP/Med student/Med Resident etc but could involve care discussion time.       Aleck Hurdle [facility] 05/08/2023 10:30 AM  Pager: see AMION  If no response to pager , please call critical care on call (see AMION) until 7pm After 7:00 pm call Elink

## 2023-05-09 ENCOUNTER — Encounter (HOSPITAL_COMMUNITY): Payer: Self-pay | Admitting: Internal Medicine

## 2023-05-09 ENCOUNTER — Inpatient Hospital Stay (HOSPITAL_COMMUNITY)

## 2023-05-09 DIAGNOSIS — R918 Other nonspecific abnormal finding of lung field: Secondary | ICD-10-CM | POA: Diagnosis not present

## 2023-05-09 DIAGNOSIS — I5021 Acute systolic (congestive) heart failure: Secondary | ICD-10-CM | POA: Diagnosis not present

## 2023-05-09 DIAGNOSIS — E870 Hyperosmolality and hypernatremia: Secondary | ICD-10-CM

## 2023-05-09 DIAGNOSIS — E875 Hyperkalemia: Secondary | ICD-10-CM | POA: Diagnosis not present

## 2023-05-09 DIAGNOSIS — J9601 Acute respiratory failure with hypoxia: Secondary | ICD-10-CM | POA: Diagnosis not present

## 2023-05-09 DIAGNOSIS — J189 Pneumonia, unspecified organism: Secondary | ICD-10-CM | POA: Diagnosis not present

## 2023-05-09 DIAGNOSIS — R739 Hyperglycemia, unspecified: Secondary | ICD-10-CM

## 2023-05-09 DIAGNOSIS — N179 Acute kidney failure, unspecified: Secondary | ICD-10-CM | POA: Diagnosis not present

## 2023-05-09 LAB — URINALYSIS, ROUTINE W REFLEX MICROSCOPIC
Bacteria, UA: NONE SEEN
Bilirubin Urine: NEGATIVE
Glucose, UA: 50 mg/dL — AB
Ketones, ur: NEGATIVE mg/dL
Leukocytes,Ua: NEGATIVE
Nitrite: NEGATIVE
Protein, ur: 100 mg/dL — AB
RBC / HPF: >50 RBC/hpf (ref 0–5)
RBC / HPF: >50 RBC/hpf (ref 0–5)
Specific Gravity, Urine: 1.02 (ref 1.005–1.030)
pH: 6 (ref 5.0–8.0)

## 2023-05-09 LAB — BASIC METABOLIC PANEL WITH GFR
Anion gap: 11 (ref 5–15)
Anion gap: 13 (ref 5–15)
BUN: 128 mg/dL — ABNORMAL HIGH (ref 8–23)
BUN: 157 mg/dL — ABNORMAL HIGH (ref 8–23)
CO2: 25 mmol/L (ref 22–32)
CO2: 22 mmol/L (ref 22–32)
Calcium: 8.8 mg/dL — ABNORMAL LOW (ref 8.9–10.3)
Calcium: 8 mg/dL — ABNORMAL LOW (ref 8.9–10.3)
Chloride: 110 mmol/L (ref 98–111)
Chloride: 107 mmol/L (ref 98–111)
Creatinine, Ser: 4.9 mg/dL — ABNORMAL HIGH (ref 0.61–1.24)
Creatinine, Ser: 5.87 mg/dL — ABNORMAL HIGH (ref 0.61–1.24)
GFR, Estimated: 11 mL/min — ABNORMAL LOW (ref >60)
GFR, Estimated: 9 mL/min — ABNORMAL LOW (ref >60)
Glucose, Bld: 254 mg/dL — ABNORMAL HIGH (ref 70–99)
Glucose, Bld: 272 mg/dL — ABNORMAL HIGH (ref 70–99)
Potassium: 5.5 mmol/L — ABNORMAL HIGH (ref 3.5–5.1)
Potassium: 5 mmol/L (ref 3.5–5.1)
Sodium: 146 mmol/L — ABNORMAL HIGH (ref 135–145)
Sodium: 142 mmol/L (ref 135–145)

## 2023-05-09 LAB — BLOOD GAS, ARTERIAL
Acid-base deficit: 4.8 mmol/L — ABNORMAL HIGH (ref 0.0–2.0)
Bicarbonate: 21.6 mmol/L (ref 20.0–28.0)
FIO2: 50 %
MECHVT: 510 mL
O2 Saturation: 93.5 %
PEEP: 8 cmH2O
Patient temperature: 36.3
RATE: 14 {breaths}/min
pCO2 arterial: 44 mmHg (ref 32–48)
pH, Arterial: 7.3 — ABNORMAL LOW (ref 7.35–7.45)
pO2, Arterial: 57 mmHg — ABNORMAL LOW (ref 83–108)

## 2023-05-09 LAB — CBC
HCT: 36.3 % — ABNORMAL LOW (ref 39.0–52.0)
Hemoglobin: 10.2 g/dL — ABNORMAL LOW (ref 13.0–17.0)
MCH: 26.8 pg (ref 26.0–34.0)
MCHC: 28.1 g/dL — ABNORMAL LOW (ref 30.0–36.0)
MCV: 95.3 fL (ref 80.0–100.0)
Platelets: 248 10*3/uL (ref 150–400)
RBC: 3.81 MIL/uL — ABNORMAL LOW (ref 4.22–5.81)
RDW: 14.6 % (ref 11.5–15.5)
WBC: 15.7 10*3/uL — ABNORMAL HIGH (ref 4.0–10.5)
nRBC: 0.6 % — ABNORMAL HIGH (ref 0.0–0.2)

## 2023-05-09 LAB — CULTURE, RESPIRATORY W GRAM STAIN
Culture: NO GROWTH
Gram Stain: NONE SEEN

## 2023-05-09 LAB — CREATININE, URINE, RANDOM: Creatinine, Urine: 117 mg/dL

## 2023-05-09 LAB — PHOSPHORUS
Phosphorus: 8.8 mg/dL — ABNORMAL HIGH (ref 2.5–4.6)
Phosphorus: 8.2 mg/dL — ABNORMAL HIGH (ref 2.5–4.6)

## 2023-05-09 LAB — MAGNESIUM
Magnesium: 3.6 mg/dL — ABNORMAL HIGH (ref 1.7–2.4)
Magnesium: 3.3 mg/dL — ABNORMAL HIGH (ref 1.7–2.4)

## 2023-05-09 LAB — GLUCOSE, CAPILLARY
Glucose-Capillary: 205 mg/dL — ABNORMAL HIGH (ref 70–99)
Glucose-Capillary: 207 mg/dL — ABNORMAL HIGH (ref 70–99)
Glucose-Capillary: 214 mg/dL — ABNORMAL HIGH (ref 70–99)
Glucose-Capillary: 216 mg/dL — ABNORMAL HIGH (ref 70–99)
Glucose-Capillary: 247 mg/dL — ABNORMAL HIGH (ref 70–99)
Glucose-Capillary: 270 mg/dL — ABNORMAL HIGH (ref 70–99)

## 2023-05-09 LAB — SODIUM, URINE, RANDOM: Sodium, Ur: 28 mmol/L

## 2023-05-09 MED ORDER — HEPARIN SODIUM (PORCINE) 5000 UNIT/ML IJ SOLN
5000.0000 [IU] | Freq: Three times a day (TID) | INTRAMUSCULAR | Status: DC
  Administered 2023-05-09 – 2023-05-10 (×4): 5000 [IU] via SUBCUTANEOUS
  Filled 2023-05-09 (×4): qty 1

## 2023-05-09 MED ORDER — HYDRALAZINE HCL 25 MG PO TABS: 25.0000 mg | ORAL_TABLET | Freq: Three times a day (TID) | ORAL | Status: DC

## 2023-05-09 MED ORDER — INSULIN GLARGINE-YFGN 100 UNIT/ML ~~LOC~~ SOLN
3.0000 [IU] | Freq: Two times a day (BID) | SUBCUTANEOUS | Status: DC
  Filled 2023-05-09 (×2): qty 0.03

## 2023-05-09 MED ORDER — SODIUM ZIRCONIUM CYCLOSILICATE 10 G PO PACK
10.0000 g | PACK | Freq: Three times a day (TID) | ORAL | Status: AC
  Administered 2023-05-09 (×2): 10 g
  Filled 2023-05-09 (×2): qty 1

## 2023-05-09 MED ORDER — NEPRO/CARBSTEADY PO LIQD
1000.0000 mL | ORAL | Status: DC
  Administered 2023-05-09: 1000 mL
  Filled 2023-05-09: qty 1185
  Filled 2023-05-09: qty 1000
  Filled 2023-05-09 (×3): qty 1185

## 2023-05-09 MED ORDER — FREE WATER
200.0000 mL | Status: DC
  Administered 2023-05-09 – 2023-05-10 (×8): 200 mL

## 2023-05-09 MED ORDER — INSULIN GLARGINE-YFGN 100 UNIT/ML ~~LOC~~ SOLN: 3.0000 [IU] | Freq: Every day | SUBCUTANEOUS | Status: DC

## 2023-05-09 MED ORDER — LACTATED RINGERS IV SOLN: INTRAVENOUS | Status: AC

## 2023-05-09 MED ORDER — LACTATED RINGERS IV BOLUS
500.0000 mL | Freq: Once | INTRAVENOUS | Status: AC
Start: 2023-05-09 — End: 2023-05-10
  Administered 2023-05-09: 500 mL via INTRAVENOUS

## 2023-05-09 MED ORDER — INSULIN ASPART 100 UNIT/ML IJ SOLN
4.0000 [IU] | INTRAMUSCULAR | Status: DC
  Administered 2023-05-09 – 2023-05-10 (×7): 4 [IU] via SUBCUTANEOUS

## 2023-05-09 MED ORDER — METHYLPREDNISOLONE SODIUM SUCC 40 MG IJ SOLR
40.0000 mg | Freq: Every day | INTRAMUSCULAR | Status: DC
  Administered 2023-05-10: 40 mg via INTRAVENOUS
  Filled 2023-05-09: qty 1

## 2023-05-09 MED ORDER — PIPERACILLIN-TAZOBACTAM IN DEX 2-0.25 GM/50ML IV SOLN
2.2500 g | Freq: Three times a day (TID) | INTRAVENOUS | Status: AC
  Administered 2023-05-09 (×2): 2.25 g via INTRAVENOUS
  Filled 2023-05-09 (×2): qty 50

## 2023-05-09 MED ORDER — INSULIN GLARGINE-YFGN 100 UNIT/ML ~~LOC~~ SOLN: 3.0000 [IU] | Freq: Two times a day (BID) | SUBCUTANEOUS | Status: DC

## 2023-05-09 NOTE — Progress Notes (Signed)
 Nutrition Follow-up  DOCUMENTATION CODES:   Non-severe (moderate) malnutrition in context of chronic illness  INTERVENTION: - Continue tube feeding via NGT: Vital 1.5 at 50 ml/h (1200 ml per day) Provides 1800 kcal, 81 gm protein, 917 ml free water daily  - FWF per CCM. Currently ordered Q4H which in addition to TF provides 2169m/day.  - Monitor weight trends.    NUTRITION DIAGNOSIS:   Moderate Malnutrition related to chronic illness (COPD) as evidenced by mild fat depletion, mild muscle depletion. *new  GOAL:   Patient will meet greater than or equal to 90% of their needs *progressing, on TF  MONITOR:   Vent status, Labs, Weight trends, TF tolerance  REASON FOR ASSESSMENT:   Consult Enteral/tube feeding initiation and management  ASSESSMENT:   89 year old male with history of COPD, diabetes mellitus, hyperlipidemia, hypertension, BPH who presented to the device department with complaint of generalized weakness, shortness of breath, nonproductive cough, fever.  On presentation,he was hypoxic at 87%.  He was recently treated for multifocal pneumonia with Augmentin and doxycycline as an outpatient but continued to have symptoms.    Chest x-ray showed patchy airspace opacity with bilateral pleural effusion, possible mild pulmonary edema.  Patient admitted for further management of multifocal pneumonia not responding to outpatient antibiotic therapy.  4/6 Admit 4/11 Intubated 4/12 TF initiated  Patient is currently intubated on ventilator support MV: 6.9 L/min Temp (24hrs), Avg:99.9 F (37.7 C), Min:98.7 F (37.1 C), Max:100.8 F (38.2 C)  No family at bedside, still unable to obtain nutrition history at this time.   TF infusing at 38mL/hr at time of visit. No noted intolerances. Patient noted to have worsening renal function, will discontinue Prosource to provide less protein.    Admit weight: 184# Current weight: 152# I&O's: - since admit *Question  accuracy of weights with such drastic difference. However, unable to verify UBW/wt trends at this time.  Medications reviewed and include: 2000 units vitamin D, Colace, Miralax, FWF Q4H, MVI, 100mg  thiamine, Lokelma Fentanyl Propofol @ 15.46mL/hr (provides 396 kcals over 24 hours)  Labs reviewed:  Na 146 K+ 5.5 Creatinine 4.90 Phosphorus 8.8 Magnesium 3.6 Triglycerides 335 (as of 4/12)   NUTRITION - FOCUSED PHYSICAL EXAM:  Flowsheet Row Most Recent Value  Orbital Region Mild depletion  Upper Arm Region Mild depletion  Thoracic and Lumbar Region Moderate depletion  Buccal Region Unable to assess  Temple Region Mild depletion  Clavicle Bone Region Moderate depletion  Clavicle and Acromion Bone Region Mild depletion  Scapular Bone Region Unable to assess  Dorsal Hand Unable to assess  Patellar Region Mild depletion  Anterior Thigh Region Mild depletion  Posterior Calf Region Mild depletion  Edema (RD Assessment) None  Hair Reviewed  Eyes Unable to assess  Mouth Unable to assess  Skin Reviewed  Nails Reviewed       Diet Order:   Diet Order             Diet NPO time specified  Diet effective now                   EDUCATION NEEDS:  Not appropriate for education at this time  Skin:  Skin Assessment: Reviewed RN Assessment  Last BM:  4/11 -type 6  Height:  Ht Readings from Last 1 Encounters:  05/07/23 5\' 6"  (1.676 m)   Weight:  Wt Readings from Last 1 Encounters:  05/09/23 69 kg   Ideal Body Weight:  64.55 kg  BMI:  Body mass  index is 24.55 kg/m.  Estimated Nutritional Needs:  Kcal:  1800-2000 Protein:  85-105 gm Fluid:  1.8-2 L    Scheryl Cushing RD, LDN Contact via Secure Chat.

## 2023-05-09 NOTE — Plan of Care (Signed)
   Problem: Clinical Measurements: Goal: Will remain free from infection Outcome: Progressing   Problem: Clinical Measurements: Goal: Respiratory complications will improve Outcome: Progressing   Problem: Clinical Measurements: Goal: Cardiovascular complication will be avoided Outcome: Progressing

## 2023-05-09 NOTE — Progress Notes (Signed)
 PT Cancellation Note  Patient Details Name: Patrick Villa MRN: [id number] DOB: 07-17-1933   Cancelled Treatment:    Reason Eval/Treat Not Completed: Medical issues which prohibited therapy  Patient is now on the vent. Patrick Villa PT Acute Rehabilitation Services Office [phone] Weekend pager-[phone]  Dareen Ebbing 05/09/2023, 8:19 AM

## 2023-05-09 NOTE — Inpatient Diabetes Management (Signed)
 Inpatient Diabetes Program Recommendations  AACE/ADA: New Consensus Statement on Inpatient Glycemic Control (2015)  Target Ranges:  Prepandial:   less than 140 mg/dL      Peak postprandial:   less than 180 mg/dL (1-2 hours)      Critically ill patients:  140 - 180 mg/dL    Latest Reference Range & Units 05/09/23 04:16 05/09/23 07:52 05/09/23 11:18  Glucose-Capillary 70 - 99 mg/dL 045 (H) 409 (H) 811 (H)    Latest Reference Range & Units 05/08/23 05:28 05/08/23 07:54 05/08/23 12:01 05/08/23 15:43 05/08/23 19:42 05/08/23 23:46  Glucose-Capillary 70 - 99 mg/dL 914 (H) 782 (H) 956 (H) 214 (H) 222 (H) 236 (H)   Review of Glycemic Control  Diabetes history: DM2 Outpatient Diabetes medications: None Current orders for Inpatient glycemic control: Semglee 3 units daily, Novolog 0-15 units Q4H; Solumedrol 40 mg daily, Vital @ 40 ml/hr  Inpatient Diabetes Program Recommendations:    Insulin: Please consider increasing Semglee to 7 units Q24H (based on 69 kg x 0.1 units) and ordering Novolog 4 units Q4H for tube feeding coverage. If tube feeding is stopped or held then Novolog tube feeding coverage should also be stopped or held.  Thanks, Beacher Limerick, RN, MSN, CDCES Diabetes Coordinator Inpatient Diabetes Program [phone] (Team Pager from 8am to 5pm)

## 2023-05-09 NOTE — Progress Notes (Addendum)
 Patient Name: Patrick Villa Date of Encounter: 05/09/2023 [facility] Cardiologist: Jodelle Red, MD   Interval Summary  .    Patient remains intubated/sedated with FiO2 55%. Temp 99.6 today. Labs today show significant change in creatinine, 3.95->4.90, phosphorus 7.3->8.8, K 5.0->5.5. Patient heavily sedated this morning, unable to rouse.   Vital Signs .    Vitals:   05/09/23 0600 05/09/23 0630 05/09/23 0745 05/09/23 0810  BP: 132/63 129/64    Pulse: 93 92 92   Resp: 17 18 17    Temp:      TempSrc:      SpO2: 97% 96% 97% 96%  Weight:      Height:        Intake/Output Summary (Last 24 hours) at 05/09/2023 0816 Last data filed at 05/09/2023 0808 Gross per 24 hour  Intake 1633.35 ml  Output 600 ml  Net 1033.35 ml      05/09/2023    5:00 AM 05/08/2023    5:00 AM 05/07/2023    6:00 AM  Last 3 Weights  Weight (lbs) 152 lb 1.9 oz 151 lb 7.3 oz 152 lb 8.9 oz  Weight (kg) 69 kg 68.7 kg 69.2 kg      Telemetry/ECG    Sinus rhythm overnight. PVC burden less than yesterday when patient had bigeminy - Personally Reviewed  Physical Exam .   GEN: Acutely ill/intubated and sedated Neck: No JVD Cardiac: RRR, no murmurs, rubs, or gallops.  Respiratory: Exam limited by mechanical ventilation. Diffusely coarse breath sounds GI: Soft, nontender, non-distended  MS: No edema  Assessment & Plan .   89 year old man with HTN, HLP, aortic atherosclerosis and coronary calcification without angina or clinically evident CAD, newly diagnosed LV systolic dysfunction (EF 40-45%, global hypokinesis), second-degree Mobitz type I AV block, admitted with fever and lethargy and multifocal pneumonia, subsequently developed acute respiratory failure requiring endotracheal intubation 05/06/2023, worsening acute renal insufficiency following diuresis.   Acute hypoxic respiratory failure Admitted with fever and lethargy and multifocal pneumonia, subsequently developed acute respiratory  failure requiring endotracheal intubation 05/06/2023. No fever today. Unchanged ventilator support today, FiO2 55%.  Continue abx and ventilator support per pulmonary critical care  Acute systolic congestive heart failure Coronary/aortic atherosclerosis In the setting of hypoxic respiratory failure, sepsis, patient found with LVEF 40-45% and global hypokinesis. Current critical illness precludes further ischemic evaluation at this time and worsening AKI/hx AVB limit GDMT. Appears euvolemic. Continue afterload reduction with Hydralazine, Imdur Given coronary atherosclerosis, would consider ischemic evaluation to further assess HFrEF upon recovery from critical illness. Recommend LDL goal 70mg /dL.   Hx second degree type I AVB Patient previously with AVB during an admission years ago. No recurrence this admission. Continue to avoid AV nodal agents  Hypertension BP remains stable this admission. Continue hydralazine and Imdur.  AKI Labs today show significant change in creatinine, 3.95->4.90, phosphorus 7.3->8.8, K 5.0->5.5.   For questions or updates, please contact [facility] Please consult [email or web address] for contact info under        Signed, Perlie Gold, PA-C   I have seen and examined the patient along with Perlie Gold, PA-C .  I have reviewed the chart, notes and new data.  I agree with PA/NP's note.  Key new complaints: intubated, sedated, calm. Key examination changes: clear lungs anteriorly, RRR, no JVD or edema Key new findings / data: slight reduction in O2 requirements (FiO2 0.5), less frequent PVCs on telemetry. Marked worsening of renal parameters despite positive fluid balance, hypernatremic. Moderately  oliguric (600 ml/24h).  PLAN: Appears to be developing ATN, may be heading towards temporary HD. This would be a big challenge at age 89, but his family presents him as being in excellent health before this acute illness. Continue vasodilators, avoid RAAS  inhibitors and beta blockers. Add free water via tube.  CRITICAL CARE Performed by: Karyl Paget Meka Lewan   Total critical care time: 30 minutes  Critical care time was exclusive of separately billable procedures and treating other patients.  Critical care was necessary to treat or prevent imminent or life-threatening deterioration.  Critical care was time spent personally by me on the following activities: development of treatment plan with patient and/or surrogate as well as nursing, discussions with consultants, evaluation of patient's response to treatment, examination of patient, obtaining history from patient or surrogate, ordering and performing treatments and interventions, ordering and review of laboratory studies, ordering and review of radiographic studies, pulse oximetry and re-evaluation of patient's condition.   Luana Rumple, MD, [facility] [phone] 05/09/2023, 9:47 AM

## 2023-05-09 NOTE — Consult Note (Addendum)
Renal Service Consult Note Patrick Villa  Patrick Villa 05/09/2023 Lynae Sandifer, MD Requesting Physician: Dr. Gaynell Keeler  Reason for Consult: Renal failure HPI: The patient is a 89 y.o. year-old w/ PMH as below who presented on 04/30/2023 complaining of generalized weakness shortness of breath and cough and fever.  Patient was hypoxic.  He had recently been treated for multifocal pneumonia with Augmentin and doxycycline as an outpatient.  Chest x-ray showed patchy airspace opacity.  IV antibiotics were started.  He was also given IV Lasix for possible volume overload.  Echo showed EF 40% and cardiology was consulted.  Creatinine was 1.1 on admission, was stable and the 1.5-2.0 range over the next several days then yesterday increased to 2.2 and up to 4.9 today.  We are asked to see for renal failure.  Patient seen in ICU bed.  On admission patient was on room air and 2 L nasal cannula the next 2 days, then high flow nasal cannula a lot at 8.  Patient was intubated on 411 with FiO2 of 100 initially down to 45% today.  Blood pressures on admission were normal to a bit high and remained that way until 05/06/2023 when the blood pressures dropped into the 90s-100 systolic range for a day and a half.  Blood pressures have come back up over the last 36 hours.  Total I's and O's are equal since admission.  Weights are not really very accurate if anything the weights have dropped a bit over the last 3 to 4 days.  The initial weight is probably from a prior Villa stay.  Fever curve is erratic up to 101 and then down again and then up again off-and-on since admission.  All cultures are negative.  Initial chest x-ray April 5 showed bilateral ill-defined infiltrates at the bases and/or atelectasis.  Small bilateral pleural effusions.  Inpatient renal related medications: Chlorthalidone 25 mg daily from for 4/6- 4/10 Cardura 2 mg twice daily started 4/07, ongoing Doxycycline 100 mg twice daily from  4/8-4/12 Eplerenone 25 mg given on 4/8 and 4/9 Lasix IV 40 mg on 4/6 and 60 mg on 4/8 Hydralazine 50 mg 3 times daily started 4/13, ongoing IV Rocephin and Zithromax from 4/5 through 4/8 IV Zosyn from 4/8 to current IV vancomycin 1 dose on 4/8 No IV contrast on this admission 4/5-4/14 On April 1 patient had a CT angio of the chest with IV contrast   PMH Colon polyps COPD Diabetes Hyperlipidemia Hypertension  Past Surgical History  Past Surgical History:  Procedure Laterality Date   COLON SURGERY     TRANSTHORACIC ECHOCARDIOGRAM     Family History  Family History  Problem Relation Age of Onset   Diabetes Mother    Heart disease Mother    Heart disease Father    Stroke Brother    Social History  reports that he quit smoking about 41 years ago. His smoking use included cigarettes. He has never used smokeless tobacco. He reports that he does not drink alcohol and does not use drugs. Allergies  Allergies  Allergen Reactions   Amlodipine Other (See Comments)    Patient developed jitteriness, tremors and unstable gait.  Same issues when re-challenged   Lipitor [Atorvastatin Calcium]     Weakness.   Zocor [Simvastatin]    Bactrim [Sulfamethoxazole-Trimethoprim] Rash   Pravachol Hives and Rash   Home medications Prior to Admission medications   Medication Sig Start Date End Date Taking? Authorizing Provider  acetaminophen (TYLENOL) 650 MG  CR tablet Take 1,300 mg by mouth every 8 (eight) hours as needed for pain.   Yes [provider]  chlorthalidone (HYGROTON) 25 MG tablet Take 1 tablet (25 mg total) by mouth daily. 2nd attempt. Pt needs yearly appt for any future refills. Please call office to schedule appt @ [phone]. Patient taking differently: Take 25 mg by mouth in the morning. 2nd attempt. Pt needs yearly appt for any future refills. Please call office to schedule appt @ [phone]. 12/30/22  Yes Jodelle Red, MD  finasteride (PROSCAR) 5 MG  tablet Take 1 tablet (5 mg total) by mouth daily. Patient taking differently: Take 5 mg by mouth in the morning. 02/25/21  Yes Cathlean Marseilles A, NP  hydrALAZINE (APRESOLINE) 50 MG tablet TAKE 1 TABLET BY MOUTH 3 TIMES  DAILY Patient taking differently: Take 50 mg by mouth in the morning and at bedtime. 11/18/22  Yes Jodelle Red, MD  terazosin (HYTRIN) 5 MG capsule Take 5 mg by mouth at bedtime.   Yes [provider]     Vitals:   05/09/23 1000 05/09/23 1123 05/09/23 1132 05/09/23 1303  BP: 132/65     Pulse: 89     Resp: 18     Temp:   98.7 F (37.1 C)   TempSrc:   Axillary   SpO2: 94% 96%  92%  Weight:      Height:       Exam Gen on vent, sedated No rash, cyanosis or gangrene Sclera anicteric, throat w/ ETT No jvd or bruits Chest clear anterior/ lateral RRR no MRG Abd soft ntnd no mass or ascites +bs GU normal MS no joint effusions or deformity Ext LE or UE edema, no wounds or ulcers Neuro is on vent, sedated   Renal-related home meds: Hygroton 25 mg daily Hydralazine 50 mg 3 times daily Terazosin 5 mg at bedtime  4/14  UA: Moderate HBG, protein 100, RBC> 50, WBC 0-5 Urine sodium 28, urine creatinine 117 Renal ultrasound: Pending   Assessment/ Plan: AKI: b/l creatinine 1.2- 1.5 from 2022- April 2025. Creat here 1.5 on admission, improved to 1.3 then high 1's and then up to 2.2 on 4/12, 3.2 yest and 4.9 today. UA on admit showed hematuria, mild proteinuria. Need renal US. UNa borderline low. I/O even and wt's are down over last few days. Hypotensive spell ~ 4/12. No nephrotoxins. Suspect AKI due to hypotension +/- hypovolemia. No nephrotoxins. Will dc bp lowering meds x 2, let SBP come up. Agree w/ IVF"s at 75 cc/hr. Will follow.  Hyperkalemia: change TF's to nepro at 50 cc/hr for now. Cont lokelma til K < 5.0.  Acute hypoxic resp failure: intubated on 4/11. Per CCM.  Multifocal PNA: sp course of IV abx CHF w/ reduced ejection fraction: cardiology  following HTN: getting cardura, hydralazine and imdur now. Will d/c cardura/ hydralazine given AKI, pt may be dependent on higher BP's. Short-term goal SBP 125- 150.       Vinson Moselle  MD [facility] 05/09/2023, 2:58 PM  Recent Labs  Lab 05/07/23 0735 05/08/23 0516 05/08/23 1255 05/09/23 0435  HGB 10.2*  --   --  10.2*  CALCIUM 9.4  --  9.0 8.8*  PHOS  --    < > 7.3* 8.8*  CREATININE 2.22*  --  3.25* 4.90*  K 4.9  --  5.0 5.5*   < > = values in this interval not displayed.   Inpatient medications:  aspirin  81 mg Per Tube Daily  Chlorhexidine Gluconate Cloth  6 each Topical Daily   cholecalciferol  2,000 Units Per Tube Daily   docusate  100 mg Per Tube BID   doxazosin  2 mg Per Tube Q12H   doxycycline  100 mg Per Tube Q12H   famotidine  10 mg Per Tube Daily   finasteride  5 mg Oral Daily   free water  200 mL Per Tube Q4H   heparin injection (subcutaneous)  5,000 Units Subcutaneous Q8H   hydrALAZINE  50 mg Per Tube TID   insulin aspart  0-15 Units Subcutaneous Q4H   [START ON 05/09/2023] insulin glargine-yfgn  3 Units Subcutaneous Daily   ipratropium-albuterol  3 mL Nebulization TID   isosorbide mononitrate  10 mg Per Tube Q8H   [START ON 05/19/2023] methylPREDNISolone (SOLU-MEDROL) injection  40 mg Intravenous Daily   multivitamin with minerals  1 tablet Per Tube Daily   mouth rinse  15 mL Mouth Rinse Q2H   polyethylene glycol  17 g Per Tube Daily   sodium zirconium cyclosilicate  10 g Per Tube TID   thiamine  100 mg Per Tube Daily    feeding supplement (VITAL 1.5 CAL) 40 mL/hr at 05/09/23 1100   fentaNYL infusion INTRAVENOUS 75 mcg/hr (05/09/23 1100)   lactated ringers 75 mL/hr at 05/09/23 1117   piperacillin-tazobactam (ZOSYN)  IV 2.25 g (05/09/23 1442)   propofol (DIPRIVAN) infusion 25 mcg/kg/min (05/09/23 1333)   acetaminophen, albuterol, fentaNYL, guaiFENesin-dextromethorphan, labetalol, mouth rinse

## 2023-05-09 NOTE — Progress Notes (Addendum)
 NAME:  Patrick Villa, MRN:  [id number], DOB:  1933-08-23, LOS: 8 ADMISSION DATE:  04/30/2023, CONSULTATION DATE:  4/8 REFERRING MD:  Trevor Mace, CHIEF COMPLAINT:  hypoxia   History of Present Illness:  [name] is an 89 y/o gentleman with a history of COPD, type 2 diabetes who presented with weakness, shortness of breath that are worse on the day of presentation but had been present for about 2 weeks.  In the emergency department he was saturating 87%.  Prior to presentation to the hospital he had been treated with doxycycline and Augmentin as an outpatient.  He was admitted for low saturations and failure of outpatient antibiotics.  Since admission he has been continued on azithromycin and ceftriaxone.  He was on 2 L nasal cannula from 4/5 until 4/7 when he jumped up to 7 L oxygen in the afternoon on 4/7. Today he feels ok; overall he reports feeling improved since admission. His nurse is concerning with how quickly his oxygen needs have risen in the past day. He denies reflux/ GERD symptoms and denies aspiration when eating/ drinking.  No family history of lung disease. He did not know he had COPD and is not on inhalers PTA. He quit smoking in the 80s after ~1ppd x ~40 years. He worked as a Technical sales engineer.   Pertinent  Medical History  HLD HTN DM COPD  Significant Hospital Events: Including procedures, antibiotic start and stop dates in addition to other pertinent events   4/5 admitted for pneumonia, started ceftriaxone and azithromycin 4/7 increasing oxygen requirements, started steroids after CT showed diffuse GG. Antibiotics escalated to cover HAP. 4/11 progressive respiratory failure, transferred to ICU, intubated, bronchoscopy   Interim History / Subjective:  Worsening renal function, ongoing oliguria  Ongoing peep 10/ FiO2 55%  Objective   Blood pressure 129/64, pulse 92, temperature 99.6 F (37.6 C), temperature source Axillary, resp. rate 17, height 5\' 6"  (1.676 m), weight 69 kg,  SpO2 96%.    Vent Mode: PRVC FiO2 (%):  [50 %-55 %] 55 % Set Rate:  [18 bmp] 18 bmp Vt Set:  [20 mL-380 mL] 380 mL PEEP:  [10 cmH20] 10 cmH20 Plateau Pressure:  [18 cmH20-24 cmH20] 18 cmH20   Intake/Output Summary (Last 24 hours) at 05/09/2023 0813 Last data filed at 05/09/2023 5284 Gross per 24 hour  Intake 1633.35 ml  Output 600 ml  Net 1033.35 ml   Filed Weights   05/07/23 0600 05/08/23 0500 05/09/23 0500  Weight: 69.2 kg 68.7 kg 69 kg    Examination: Propofol 35, fent 100 General:  critically ill elderly male sedated on MV HEENT: MM pink/moist, pinpoint pupils, no JVP Neuro: sedated> no response to noxious stimuli CV: rr, NSR PULM:  MV supported, currently on 6 cc/kg, frequent double stacking on vent, coarse > no secretions, very delayed faint cough GI: soft, bs+, ND, foley- tea colored Extremities: warm/dry, no tibial edema  Skin: no rashes   Tmax 100.2 CBG trend > 200, 205- 236 Labs Na 146, K 5.5, BUN/ sCr 101/ 3.25> 128/ 4.9, mag 3.6    Resolved Hospital Problem list     Assessment & Plan:  Acute respiratory failure with hypoxia Multifocal infiltrates, improving Differential diagnosis includes some kind of AIP including acute HP, organizing pneumonia, viral/atypical infection.  Hx COPD not on home O2 Less likely acute pulmonary edema given downtrending BNP, physical exam without signs of volume overload - RVP / COVD/ RSV neg. Mrsa negative. Blood cultures negative. Strep pneumoniae negative. Legionella negative -  ESR > 83> 82, CRP 18.4> 3.3.  steroids started 4/8 P:  - continue weaning FiO2 / PEEP as tolerated> limiting SAT/SBT currently  - full MV support, current on 6, consider increasing to 8cc/kg IBW, pressures remains within goal Pplat <30 and DP<15  - VAP prevention protocol/ PPI - PAD protocol for sedation> minimize propofol/ fentanyl, too sedated currently - intermittent CXR/ ABG> CXR improving as of 4/13 - cultures remain ngtd - cont doxy as  below, complete 7 days of zosyn today - pneumocytosis and CPP pending, otherwise autoimmune panel neg thus far.  CRP improving, ESR unchanged - follow 4/11 cytology RML - cont steroids> decrease to 40mg  daily 4/14 - trend WBC /fever curve   Acute encephalopathy Delirium from hospital stay, hypoxemia, age and steroid use - PAD protocol as above   AKI with uremia and oliguria with mild hyperkalemia Hypernatremia - progressive worsening of BUN/ sCr despite lasix 4/6 and 4/8.  Chlorthalidone stopped 4/10.  Hemodynamics have been stable.  Euvolemic on exam.  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  Currently not a candidate for further invasive ischemic workup at this time with AKI, till critical illness recovered - no BB with HB hx, AKI limiting meds.   - monitor volume status closely  Hyperglycemia secondary to steroids - A1c 6.4 on 04/30/20 - cont SSI prn, CBG q 4 - add semglee - goal 140-180  Deconditioning At risk for malnutrition - PT/ OT when appropriate - EN per GT  ? Tick bite - per [facility] notes, recent tick bite> will schedule for 10 days of doxy  Best Practice (right click and "Reselect all SmartList Selections" daily)   Diet/type: tube feeds DVT prophylaxis prophylactic heparin  Pressure ulcer(s): pressure ulcer assessment deferred  GI prophylaxis: H2B Lines: N/A Foley:  Yes, and it is still needed Code Status:  full code Last date of  multidisciplinary goals of care discussion [daughter/wife updated at bedside daily. Discussed time limited trial of  life support and re-evaluating at the 72 hour mark.]  Pending 4/14, no family at bedside on am rounds  Labs   CBC: Recent Labs  Lab 05/03/23 0525 05/04/23 0447 05/07/23 0735  WBC 6.8 10.4 13.5*  HGB 11.9* 11.5* 10.2*  HCT 39.4 36.0* 34.7*  MCV 86.8 85.1 91.6  PLT 293 277 276    Basic Metabolic Panel: Recent Labs  Lab 05/03/23 0525 05/04/23 0447 05/05/23 0450 05/06/23 0406 05/07/23 0735 05/08/23 0516 05/08/23 1255 05/09/23 0435  NA 137   < > 141 142 145  --  144 146*  K 3.3*   < > 3.2* 4.0 4.9  --  5.0 5.5*  CL 101   < > 104 109 110  --  109 110  CO2 25   < > 26 23 27   --  24 25  GLUCOSE 125*   < > 150* 147* 156*  --  185* 254*  BUN 28*   < > 45*  56* 62*  --  101* 128*  CREATININE 1.38*   < > 1.88* 1.97* 2.22*  --  3.25* 4.90*  CALCIUM 9.3   < > 9.3 9.6 9.4  --  9.0 8.8*  MG 2.3  --   --   --   --  3.1* 3.2* 3.6*  PHOS  --   --   --   --   --  6.7* 7.3* 8.8*   < > = values in this interval not displayed.   GFR: Estimated Creatinine Clearance: 9.6 mL/min (A) (by C-G formula based on SCr of 4.9 mg/dL (H)). Recent Labs  Lab 05/03/23 0525 05/04/23 0447 05/07/23 0735  WBC 6.8 10.4 13.5*    Liver Function Tests: No results for input(s): "AST", "ALT", "ALKPHOS", "BILITOT", "PROT", "ALBUMIN" in the last 168 hours.    CCT:      Early Glisson, MSN, AG-ACNP-BC [facility] 05/09/2023, 8:13 AM  See Amion for pager If no response to pager , please call [phone] until 7pm After 7:00 pm call Elink  [phone]

## 2023-05-10 ENCOUNTER — Ambulatory Visit (HOSPITAL_BASED_OUTPATIENT_CLINIC_OR_DEPARTMENT_OTHER): Payer: Medicare Other | Admitting: Family

## 2023-05-10 DIAGNOSIS — G9340 Encephalopathy, unspecified: Secondary | ICD-10-CM | POA: Diagnosis not present

## 2023-05-10 DIAGNOSIS — E162 Hypoglycemia, unspecified: Secondary | ICD-10-CM

## 2023-05-10 DIAGNOSIS — J189 Pneumonia, unspecified organism: Secondary | ICD-10-CM | POA: Diagnosis not present

## 2023-05-10 DIAGNOSIS — J9601 Acute respiratory failure with hypoxia: Secondary | ICD-10-CM | POA: Diagnosis not present

## 2023-05-10 DIAGNOSIS — E440 Moderate protein-calorie malnutrition: Secondary | ICD-10-CM | POA: Insufficient documentation

## 2023-05-10 DIAGNOSIS — N179 Acute kidney failure, unspecified: Secondary | ICD-10-CM | POA: Diagnosis not present

## 2023-05-10 LAB — BLOOD GAS, ARTERIAL
Acid-base deficit: 8.8 mmol/L — ABNORMAL HIGH (ref 0.0–2.0)
Acid-base deficit: 19.3 mmol/L — ABNORMAL HIGH (ref 0.0–2.0)
Bicarbonate: 17.4 mmol/L — ABNORMAL LOW (ref 20.0–28.0)
Bicarbonate: 10 mmol/L — ABNORMAL LOW (ref 20.0–28.0)
Drawn by: 30136
Drawn by: 270271
FIO2: 70 %
FIO2: 70 %
MECHVT: 510 mL
MECHVT: 510 mL
O2 Saturation: 96.5 %
O2 Saturation: 96.4 %
PEEP: 8 cmH2O
PEEP: 8 cmH2O
Patient temperature: 37.9
Patient temperature: 37.1
RATE: 14 {breaths}/min
RATE: 14 {breaths}/min
pCO2 arterial: 40 mmHg (ref 32–48)
pCO2 arterial: 38 mmHg (ref 32–48)
pH, Arterial: 7.26 — ABNORMAL LOW (ref 7.35–7.45)
pH, Arterial: 7.03 — CL (ref 7.35–7.45)
pO2, Arterial: 75 mmHg — ABNORMAL LOW (ref 83–108)
pO2, Arterial: 82 mmHg — ABNORMAL LOW (ref 83–108)

## 2023-05-10 LAB — CBC
HCT: 27.3 % — ABNORMAL LOW (ref 39.0–52.0)
HCT: 26.4 % — ABNORMAL LOW (ref 39.0–52.0)
Hemoglobin: 8.5 g/dL — ABNORMAL LOW (ref 13.0–17.0)
Hemoglobin: 8.3 g/dL — ABNORMAL LOW (ref 13.0–17.0)
MCH: 28.1 pg (ref 26.0–34.0)
MCH: 28.8 pg (ref 26.0–34.0)
MCHC: 31.1 g/dL (ref 30.0–36.0)
MCHC: 31.4 g/dL (ref 30.0–36.0)
MCV: 90.1 fL (ref 80.0–100.0)
MCV: 91.7 fL (ref 80.0–100.0)
Platelets: 179 10*3/uL (ref 150–400)
Platelets: 207 10*3/uL (ref 150–400)
RBC: 3.03 MIL/uL — ABNORMAL LOW (ref 4.22–5.81)
RBC: 2.88 MIL/uL — ABNORMAL LOW (ref 4.22–5.81)
RDW: 14.7 % (ref 11.5–15.5)
RDW: 15 % (ref 11.5–15.5)
WBC: 16.8 10*3/uL — ABNORMAL HIGH (ref 4.0–10.5)
WBC: 20.3 10*3/uL — ABNORMAL HIGH (ref 4.0–10.5)
nRBC: 1.1 % — ABNORMAL HIGH (ref 0.0–0.2)
nRBC: 2 % — ABNORMAL HIGH (ref 0.0–0.2)

## 2023-05-10 LAB — RENAL FUNCTION PANEL
Albumin: 1.6 g/dL — ABNORMAL LOW (ref 3.5–5.0)
Anion gap: 19 — ABNORMAL HIGH (ref 5–15)
BUN: 187 mg/dL — ABNORMAL HIGH (ref 8–23)
CO2: 16 mmol/L — ABNORMAL LOW (ref 22–32)
Calcium: 7.9 mg/dL — ABNORMAL LOW (ref 8.9–10.3)
Chloride: 108 mmol/L (ref 98–111)
Creatinine, Ser: 8.15 mg/dL — ABNORMAL HIGH (ref 0.61–1.24)
GFR, Estimated: 6 mL/min — ABNORMAL LOW (ref >60)
Glucose, Bld: 218 mg/dL — ABNORMAL HIGH (ref 70–99)
Phosphorus: 11.6 mg/dL — ABNORMAL HIGH (ref 2.5–4.6)
Potassium: 6.1 mmol/L — ABNORMAL HIGH (ref 3.5–5.1)
Sodium: 143 mmol/L (ref 135–145)

## 2023-05-10 LAB — HEMOGLOBIN A1C
Hgb A1c MFr Bld: 7.2 % — ABNORMAL HIGH (ref 4.8–5.6)
Mean Plasma Glucose: 160 mg/dL

## 2023-05-10 LAB — BASIC METABOLIC PANEL WITH GFR
Anion gap: 16 — ABNORMAL HIGH (ref 5–15)
BUN: 159 mg/dL — ABNORMAL HIGH (ref 8–23)
CO2: 19 mmol/L — ABNORMAL LOW (ref 22–32)
Calcium: 7.9 mg/dL — ABNORMAL LOW (ref 8.9–10.3)
Chloride: 108 mmol/L (ref 98–111)
Creatinine, Ser: 7.17 mg/dL — ABNORMAL HIGH (ref 0.61–1.24)
GFR, Estimated: 7 mL/min — ABNORMAL LOW (ref >60)
Glucose, Bld: 194 mg/dL — ABNORMAL HIGH (ref 70–99)
Potassium: 4.6 mmol/L (ref 3.5–5.1)
Sodium: 143 mmol/L (ref 135–145)

## 2023-05-10 LAB — MAGNESIUM: Magnesium: 3.4 mg/dL — ABNORMAL HIGH (ref 1.7–2.4)

## 2023-05-10 LAB — GLUCOSE, CAPILLARY
Glucose-Capillary: 154 mg/dL — ABNORMAL HIGH (ref 70–99)
Glucose-Capillary: 178 mg/dL — ABNORMAL HIGH (ref 70–99)
Glucose-Capillary: 206 mg/dL — ABNORMAL HIGH (ref 70–99)
Glucose-Capillary: 215 mg/dL — ABNORMAL HIGH (ref 70–99)
Glucose-Capillary: 199 mg/dL — ABNORMAL HIGH (ref 70–99)

## 2023-05-10 LAB — TRIGLYCERIDES: Triglycerides: 668 mg/dL — ABNORMAL HIGH (ref <150)

## 2023-05-10 LAB — CYTOLOGY - NON PAP

## 2023-05-10 LAB — CYCLIC CITRUL PEPTIDE ANTIBODY, IGG/IGA: CCP Antibodies IgG/IgA: 9 U (ref 0–19)

## 2023-05-10 LAB — PHOSPHORUS: Phosphorus: 16.2 mg/dL — ABNORMAL HIGH (ref 2.5–4.6)

## 2023-05-10 MED ORDER — VITAL 1.5 CAL PO LIQD
1000.0000 mL | ORAL | Status: DC
  Administered 2023-05-10: 1000 mL
  Filled 2023-05-10: qty 1000

## 2023-05-10 MED ORDER — SODIUM BICARBONATE 8.4 % IV SOLN
INTRAVENOUS | Status: AC
  Administered 2023-05-10: 100 meq via INTRAVENOUS
  Filled 2023-05-10: qty 50

## 2023-05-10 MED ORDER — NOREPINEPHRINE 4 MG/250ML-% IV SOLN
0.0000 ug/min | INTRAVENOUS | Status: DC
  Administered 2023-05-10: 8 ug/min via INTRAVENOUS
  Filled 2023-05-10: qty 250

## 2023-05-10 MED ORDER — SODIUM CHLORIDE 0.9 % IV SOLN
4.0000 g | Freq: Once | INTRAVENOUS | Status: DC
  Filled 2023-05-10: qty 40

## 2023-05-10 MED ORDER — NOREPINEPHRINE 4 MG/250ML-% IV SOLN
INTRAVENOUS | Status: AC
  Administered 2023-05-10: 2 ug/min via INTRAVENOUS
  Filled 2023-05-10: qty 250

## 2023-05-10 MED ORDER — NOREPINEPHRINE 4 MG/250ML-% IV SOLN
INTRAVENOUS | Status: AC
  Filled 2023-05-10: qty 250

## 2023-05-10 MED ORDER — LACTATED RINGERS IV BOLUS
500.0000 mL | Freq: Once | INTRAVENOUS | Status: AC
  Administered 2023-05-10: 500 mL via INTRAVENOUS

## 2023-05-10 MED ORDER — PHENYLEPHRINE 80 MCG/ML (10ML) SYRINGE FOR IV PUSH (FOR BLOOD PRESSURE SUPPORT)
PREFILLED_SYRINGE | INTRAVENOUS | Status: AC
  Filled 2023-05-10: qty 10

## 2023-05-10 MED ORDER — PRISMASOL BGK 4/2.5 32-4-2.5 MEQ/L EC SOLN: Status: DC

## 2023-05-10 MED ORDER — SODIUM BICARBONATE 8.4 % IV SOLN: 100.0000 meq | Freq: Once | INTRAVENOUS | Status: AC

## 2023-05-10 MED ORDER — INSULIN GLARGINE-YFGN 100 UNIT/ML ~~LOC~~ SOLN
6.0000 [IU] | Freq: Two times a day (BID) | SUBCUTANEOUS | Status: DC
  Filled 2023-05-10: qty 0.06

## 2023-05-10 MED ORDER — VASOPRESSIN 20 UNITS/100 ML INFUSION FOR SHOCK: 0.0400 [IU]/min | INTRAVENOUS | Status: DC

## 2023-05-10 MED ORDER — SODIUM BICARBONATE 8.4 % IV SOLN
INTRAVENOUS | Status: AC
  Filled 2023-05-10: qty 50

## 2023-05-10 MED ORDER — NOREPINEPHRINE 16 MG/250ML-% IV SOLN: 0.0000 ug/min | INTRAVENOUS | Status: DC

## 2023-05-10 MED ORDER — SODIUM CHLORIDE 0.9 % IV SOLN
250.0000 mL | INTRAVENOUS | Status: DC
  Administered 2023-05-10: 250 mL via INTRAVENOUS

## 2023-05-10 MED ORDER — HEPARIN SODIUM (PORCINE) 1000 UNIT/ML DIALYSIS
1000.0000 [IU] | INTRAMUSCULAR | Status: DC | PRN
  Filled 2023-05-10 (×2): qty 6

## 2023-05-10 MED ORDER — SODIUM BICARBONATE 8.4 % IV SOLN
INTRAVENOUS | Status: AC
  Administered 2023-05-10: 50 meq
  Filled 2023-05-10: qty 100

## 2023-05-10 MED ORDER — SODIUM CHLORIDE 0.9 % IV SOLN: INTRAVENOUS | Status: DC | PRN

## 2023-05-10 MED ORDER — SODIUM CHLORIDE 0.9 % IV SOLN
350.0000 [IU]/h | INTRAVENOUS | Status: DC
  Administered 2023-05-10: 350 [IU]/h via INTRAVENOUS_CENTRAL
  Filled 2023-05-10: qty 2
  Filled 2023-05-10: qty 10000

## 2023-05-10 MED ORDER — DEXMEDETOMIDINE HCL IN NACL 400 MCG/100ML IV SOLN
INTRAVENOUS | Status: AC
  Administered 2023-05-10: 0.4 ug/kg/h via INTRAVENOUS
  Filled 2023-05-10: qty 100

## 2023-05-10 MED ORDER — B COMPLEX-C PO TABS: 1.0000 | ORAL_TABLET | Freq: Every day | ORAL | Status: DC

## 2023-05-10 MED ORDER — ALBUMIN HUMAN 25 % IV SOLN
50.0000 g | Freq: Once | INTRAVENOUS | Status: AC
  Administered 2023-05-10: 50 g via INTRAVENOUS
  Filled 2023-05-10: qty 200

## 2023-05-10 MED ORDER — EPINEPHRINE 1 MG/10ML IJ SOSY
PREFILLED_SYRINGE | INTRAMUSCULAR | Status: AC
  Filled 2023-05-10: qty 50

## 2023-05-10 MED ORDER — VASOPRESSIN 20 UNITS/100 ML INFUSION FOR SHOCK
INTRAVENOUS | Status: AC
  Administered 2023-05-10: 0.04 [IU]/min via INTRAVENOUS
  Filled 2023-05-10: qty 100

## 2023-05-10 MED ORDER — STERILE WATER FOR INJECTION IV SOLN
INTRAMUSCULAR | Status: DC
  Filled 2023-05-10: qty 1000

## 2023-05-10 MED ORDER — PROSOURCE TF20 ENFIT COMPATIBL EN LIQD: 60.0000 mL | Freq: Three times a day (TID) | ENTERAL | Status: DC

## 2023-05-10 MED ORDER — DEXMEDETOMIDINE HCL IN NACL 200 MCG/50ML IV SOLN: 0.0000 ug/kg/h | INTRAVENOUS | Status: DC

## 2023-05-11 ENCOUNTER — Ambulatory Visit (HOSPITAL_COMMUNITY)

## 2023-05-11 NOTE — ED Provider Notes (Signed)
 Called to the floor further cardiac arrest for an 89 year old male who was intubated and sedated with worsening kidney function over the last 24 hours uptrending potassium levels newly diagnosed LV systolic dysfunction with a EF of 40 to 45%, and multifocal pneumonia.  EPR was already in progress on my arrival.  He was a E-link physician actively running the code on my arrival.  ET tube was intact patient was being easily bagged.  He had equal bilateral breath sounds.  Patient had multiple rounds of CPR with pulseless electrical activity.  He was given multiple rounds of cardiac epi as well as bicarb, calcium gluconate, amiodarone 300 mg and 150 mg.  The family was outside of the room during the cardiac arrest/CPR in progress and requested to stop CPR after approximately 10 to 15 minutes of CPR.  Time of death was called at May 17, 2135.      Patrick Bucco, DO 05/06/2023 May 17, 2023

## 2023-05-19 NOTE — Progress Notes (Signed)
 Sepsis secondary to pneumonia, SIRS in the context of a pneumonia

## 2023-05-20 LAB — PNEUMOCYSTIS JIROVECI SMEAR BY DFA: Pneumocystis jiroveci Ag: NEGATIVE

## 2023-05-26 NOTE — Progress Notes (Signed)
NAME:  Patrick Villa, MRN:  [id number], DOB:  12/31/1933, LOS: 9 ADMISSION DATE:  04/30/2023, CONSULTATION DATE:  4/8 REFERRING MD:  Patrick Villa, CHIEF COMPLAINT:  hypoxia   History of Present Illness:  Patrick Villa is an 89 y/o gentleman with a history of COPD, type 2 diabetes who presented with weakness, shortness of breath that are worse on the day of presentation but had been present for about 2 weeks.  In the emergency department he was saturating 87%.  Prior to presentation to the hospital he had been treated with doxycycline and Augmentin as an outpatient.  He was admitted for low saturations and failure of outpatient antibiotics.  Since admission he has been continued on azithromycin and ceftriaxone.  He was on 2 L nasal cannula from 4/5 until 4/7 when he jumped up to 7 L oxygen in the afternoon on 4/7. Today he feels ok; overall he reports feeling improved since admission. His nurse is concerning with how quickly his oxygen needs have risen in the past day. He denies reflux/ GERD symptoms and denies aspiration when eating/ drinking.  No family history of lung disease. He did not know he had COPD and is not on inhalers PTA. He quit smoking in the 80s after ~1ppd x ~40 years. He worked as a Technical sales engineer.   Pertinent  Medical History  HLD, HTN, DM, COPD  Significant Hospital Events: Including procedures, antibiotic start and stop dates in addition to other pertinent events   4/5 admitted for pneumonia, started ceftriaxone and azithromycin 4/7 increasing oxygen requirements, started steroids after CT showed diffuse GG. Antibiotics escalated to cover HAP. 4/11 progressive respiratory failure, transferred to ICU, intubated, bronchoscopy   Interim History / Subjective:  NAEON, some difficulty with sedation - either agitated or down. Will transition off propofol to dex.   Objective   Blood pressure (!) 79/39, pulse (!) 111, temperature 98.2 F (36.8 C), temperature source Axillary, resp. rate (!)  24, height 5\' 6"  (1.676 m), weight 73.7 kg, SpO2 93%.    Vent Mode: PRVC FiO2 (%):  [45 %-70 %] 70 % Set Rate:  [14 bmp-18 bmp] 14 bmp Vt Set:  [380 mL-510 mL] 510 mL PEEP:  [8 cmH20] 8 cmH20 Plateau Pressure:  [20 cmH20-26 cmH20] 20 cmH20   Intake/Output Summary (Last 24 hours) at 05/21/2023 8295 Last data filed at 05/24/2023 6213 Gross per 24 hour  Intake 3280.82 ml  Output 665 ml  Net 2615.82 ml   Filed Weights   05/08/23 0500 05/09/23 0500 05/17/2023 0428  Weight: 68.7 kg 69 kg 73.7 kg   Examination: General: elderly male, critically ill appearing, laying in bed, NAD HEENT: perrla, pinpoint, conjunctival swelling L>R eye, mucous membranes moist  Neuro: sedated, +cough/gag, does not respond to painful stimulus  CV: s1/s2, rrr, no appreciable murmur, rub, gallop PULM: vented, 80% 8 PEEP, synchronous, rales in the lower lung fields, no wheezing GI: rounded,soft, non distended, +BS Extremities: warm, no edema  Resolved Hospital Problem list    Assessment & Plan:  Acute respiratory failure with hypoxia Multifocal infiltrates, improving Differential diagnosis includes some kind of AIP including acute HP, organizing pneumonia, viral/atypical infection.  Hx COPD not on home O2 Less likely acute pulmonary edema given downtrending BNP, physical exam without signs of volume overload. RVP / COVD/ RSV neg. Mrsa negative. Blood cultures negative. Strep pneumoniae negative. Legionella negative. ESR > 83> 82, CRP 18.4> 3.3.  Steroids started 4/8 P:  - ABG this AM and wean FiO2 / PEEP as  able. No SBT given settings currently  - solu-medrol 40mg  daily - full mechanical vent support - lung protective ventilation 6-8cc/kg Vt - VAP and PAD bundle in place - switch propofol to precedex to help wean - titrate FiO2 to sat goal >92  - maintain peak/plats <30, driving pressures <62   - continue weaning FiO2 / PEEP as tolerated> limiting SAT/SBT currently  - intermittent CXR/ ABG> CXR improving as  of 4/13 - cultures remain ngtd - cont doxy as below, Zosyn course completed 4/14 - pneumocytosis and CPP pending, otherwise autoimmune panel neg thus far.  CRP improving, ESR unchanged - follow 4/11 cytology RML - still in process 4/15 - trend WBC /fever curve  Acute encephalopathy Delirium from hospital stay, hypoxemia, age and steroid use - PAD protocol as above - delirium precautions   Acute kidney injury  Oliguria  Anion gap metabolic acidosis - suspect uremia related Progressive worsening of BUN/ sCr despite lasix 4/6 and 4/8.  Chlorthalidone stopped 4/10.  Hemodynamics have been stable.  Euvolemic on exam.  Suspect ATN, ddx also include possible pulmonary renal but BAL unimpressive. Renal ultrasound negative. Urine Na 28.  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  Currently not a candidate for further invasive ischemic workup at this time with AKI, till critical illness recovered - no BB with HB hx, AKI limiting meds.   - monitor volume status closely  Hyperglycemia secondary to steroids A1c 6.4 on 04/30/20 - cont SSI prn, CBG q 4 - increase semglee 6U BID - goal 140-180  Deconditioning At risk for malnutrition - PT/ OT when appropriate - TF to goal  - thiamine, multivitamin   ? Tick bite - per [facility] notes, recent tick bite> will schedule for 10 days of doxy  Best Practice (right click and "Reselect all SmartList Selections" daily)  Diet/type: NPO/TF DVT prophylaxis: prophylactic heparin  Pressure  ulcer(s): pressure ulcer assessment deferred  GI prophylaxis: H2B Lines: N/A Foley:  Yes, and it is still needed Code Status:  full code Last date of multidisciplinary goals of care discussion [previous provider discussed time limited trial of  life support and re-evaluating at the 72 hour mark. No family at bedside 4/15AM on rounds]  Labs   CBC: Recent Labs  Lab 05/04/23 0447 05/07/23 0735 05/09/23 0435 04/26/2023 0630  WBC 10.4 13.5* 15.7* 16.8*  HGB 11.5* 10.2* 10.2* 8.5*  HCT 36.0* 34.7* 36.3* 27.3*  MCV 85.1 91.6 95.3 90.1  PLT 277 276 248 179    Basic Metabolic Panel: Recent Labs  Lab 05/07/23 0735 05/08/23 0516 05/08/23 1255 05/09/23 0435 05/09/23 1632 05/09/23 1804 05/20/2023 0630  NA 145  --  144 146*  --  142 143  K 4.9  --  5.0 5.5*  --  5.0 4.6  CL 110  --  109 110  --  107 108  CO2 27  --  24 25  --  22 19*  GLUCOSE 156*  --  185* 254*  --  272* 194*  BUN 62*  --  101* 128*  --  157* 159*  CREATININE 2.22*  --  3.25* 4.90*  --  5.87* 7.17*  CALCIUM 9.4  --  9.0 8.8*  --  8.0* 7.9*  MG  --  3.1* 3.2* 3.6* 3.3*  --   --   PHOS  --  6.7* 7.3* 8.8* 8.2*  --   --    GFR: Estimated Creatinine Clearance: 6.6 mL/min (A) (by C-G formula based on SCr of 7.17 mg/dL (H)). Recent Labs  Lab 05/04/23 0447 05/07/23 0735 05/09/23 0435 05/07/2023 0630  WBC 10.4 13.5* 15.7* 16.8*    Liver Function Tests: No results for input(s): "AST", "ALT", "ALKPHOS", "BILITOT", "PROT", "ALBUMIN" in the last 168 hours.    CCT: 40  Arlys Lamer [facility] 05/16/2023 9:32 AM  Please see [email or web address] for pager details.  From 7A-7P if no response, please call [phone] After hours, please call ELink [phone]

## 2023-05-26 NOTE — Progress Notes (Signed)
eLink Physician-Brief Progress Note Patient Name: Patrick Villa DOB: 06-25-1933 MRN: [id number]   Date of Service  04/27/2023  HPI/Events of Note  Nurse is turning down sedation, but BP still remains low, 97/41 map 57. Only has PIVs 1.8 liters positive since admission not accounting for insensible losses  eICU Interventions  Trial of 500 cc LR bolus     Intervention Category Intermediate Interventions: Hypotension - evaluation and management  Turner Gains 05/13/2023, 6:19 AM

## 2023-05-26 NOTE — Progress Notes (Addendum)
eLink Physician-Brief Progress Note Patient Name: Patrick Villa DOB: 11-24-1933 MRN: [id number]   Date of Service  05/04/2023  HPI/Events of Note  89 year old was initially admitted with COPD exacerbation in the setting of pneumonia and underlying type 2 diabetes with progressive respiratory failure requiring intubation and bronchoscopy now has worsening renal failure and on CRRT.  Family members at bedside.  Notified that the patient has been having negative pressure alarms on the right femoral HD cath and progressively worsening hypotension with escalating pressor requirements.  eICU Interventions  Switch vasopressors to central line.  Increase ceiling of norepinephrine to 40 mcg. Requested rapid escalation from 10 mcg to 15 mcg verbally  Albumin 25% bolus.  In the interim, to 50 cc NS bolus.  Will obtain gas   1929 -vasopressor requirements escalated rapidly requiring 40 mcg of central norepinephrine.  Adding vasopressin and already on methylprednisolone.  Has relative hypoxemia.  Will push 1 ampoule of bicarb and obtain the gas.  1944 -able to rapidly de-escalate off of vasopressors back to norepinephrine and vasopressin with verbal orders after small fluid bolus and discontinuation of CRRT.  The filter went down well the patient was hypotensive without the ability to return blood.  Will fluid bolus with the albumin and if the patient remains hemodynamically stable, will reattempt CRRT with euvolemic goal.  Place arterial line/obtain ABG.  Hold tube feeds while on dual pressors.  2109 -add 2 A of bicarb push with continuous bicarb infusion.  Increased respiratory rate to 25 to induce respiratory alkalosis  2110 -patient developed a cardiac arrest initially with pulseless electrical activity and then subsequently asystole.  Attempted multiple rounds of bicarb, calcium, and epinephrine with excellent CPR.  Unfortunately, the patient had an unknown recoverable repeat arrest and subsequently  family presented to the room at 2127 and requested cessation of resuscitation efforts.  ER team available to bedside at that point to pronounce.  Pulseless with no electrical activity at the time of pronunciation.  Intervention Category Major Interventions: Shock - evaluation and management  [name] 05/09/2023, 7:21 PM

## 2023-05-26 NOTE — Progress Notes (Signed)
 0400- Fentanyl turned off due to sudden drop in BP. Propofol titrated down to 5 mcg. Provider at [facility] notified of change.   22- Charge nurse turned fentanyl back on per eLink provider at 50  0620- New order for LR bolus 500 mL's

## 2023-05-26 NOTE — Plan of Care (Signed)
  Problem: Education: Goal: Knowledge of General Education information will improve Description: Including pain rating scale, medication(s)/side effects and non-pharmacologic comfort measures Outcome: Progressing   Problem: Health Behavior/Discharge Planning: Goal: Ability to manage health-related needs will improve Outcome: Progressing   Problem: Clinical Measurements: Goal: Ability to maintain clinical measurements within normal limits will improve Outcome: Progressing Goal: Will remain free from infection Outcome: Progressing Goal: Diagnostic test results will improve Outcome: Progressing Goal: Respiratory complications will improve Outcome: Progressing Goal: Cardiovascular complication will be avoided Outcome: Progressing   Problem: Activity: Goal: Risk for activity intolerance will decrease Outcome: Progressing   Problem: Nutrition: Goal: Adequate nutrition will be maintained Outcome: Progressing   Problem: Coping: Goal: Level of anxiety will decrease Outcome: Progressing   Problem: Elimination: Goal: Will not experience complications related to urinary retention Outcome: Progressing   Problem: Pain Managment: Goal: General experience of comfort will improve and/or be controlled Outcome: Progressing   Problem: Safety: Goal: Ability to remain free from injury will improve Outcome: Progressing   Problem: Skin Integrity: Goal: Risk for impaired skin integrity will decrease Outcome: Progressing   Problem: Activity: Goal: Ability to tolerate increased activity will improve Outcome: Progressing   Problem: Clinical Measurements: Goal: Ability to maintain a body temperature in the normal range will improve Outcome: Progressing   Problem: Respiratory: Goal: Ability to maintain adequate ventilation will improve Outcome: Progressing Goal: Ability to maintain a clear airway will improve Outcome: Progressing   Problem: Education: Goal: Understanding of CV  disease, CV risk reduction, and recovery process will improve Outcome: Progressing Goal: Individualized Educational Video(s) Outcome: Progressing   Problem: Activity: Goal: Ability to return to baseline activity level will improve Outcome: Progressing   Problem: Cardiovascular: Goal: Ability to achieve and maintain adequate cardiovascular perfusion will improve Outcome: Progressing Goal: Vascular access site(s) Level 0-1 will be maintained Outcome: Progressing   Problem: Health Behavior/Discharge Planning: Goal: Ability to safely manage health-related needs after discharge will improve Outcome: Progressing   Problem: Activity: Goal: Ability to tolerate increased activity will improve Outcome: Progressing   Problem: Respiratory: Goal: Ability to maintain a clear airway and adequate ventilation will improve Outcome: Progressing   Problem: Role Relationship: Goal: Method of communication will improve Outcome: Progressing   Problem: Education: Goal: Ability to describe self-care measures that may prevent or decrease complications (Diabetes Survival Skills Education) will improve Outcome: Progressing Goal: Individualized Educational Video(s) Outcome: Progressing   Problem: Coping: Goal: Ability to adjust to condition or change in health will improve Outcome: Progressing   Problem: Fluid Volume: Goal: Ability to maintain a balanced intake and output will improve Outcome: Progressing   Problem: Health Behavior/Discharge Planning: Goal: Ability to identify and utilize available resources and services will improve Outcome: Progressing Goal: Ability to manage health-related needs will improve Outcome: Progressing   Problem: Metabolic: Goal: Ability to maintain appropriate glucose levels will improve Outcome: Progressing   Problem: Nutritional: Goal: Maintenance of adequate nutrition will improve Outcome: Progressing Goal: Progress toward achieving an optimal weight  will improve Outcome: Progressing   Problem: Skin Integrity: Goal: Risk for impaired skin integrity will decrease Outcome: Progressing   Problem: Tissue Perfusion: Goal: Adequacy of tissue perfusion will improve Outcome: Progressing

## 2023-05-26 NOTE — Progress Notes (Signed)
Nutrition Follow-up  DOCUMENTATION CODES:   Non-severe (moderate) malnutrition in context of chronic illness  INTERVENTION:  - Adjust tube feeding regimen as below, via NGT: Vital 1.5 at 45 ml/h (1080 ml per day) ProSource TF20 60ml TID Provides 1860 kcal, 133 gm protein, 825 ml free water daily   - FWF per CCM. Currently ordered Q4H which in addition to TF provides 2024mL/day.    - Add B-complex with vitamin C now that on CRRT and with new wound.  - Monitor weight trends.    NUTRITION DIAGNOSIS:   Moderate Malnutrition related to chronic illness (COPD) as evidenced by mild fat depletion, mild muscle depletion. *ongoing  GOAL:   Patient will meet greater than or equal to 90% of their needs *met with TF  MONITOR:   Vent status, Labs, Weight trends, TF tolerance  REASON FOR ASSESSMENT:   Consult Enteral/tube feeding initiation and management  ASSESSMENT:   89 year old male with history of COPD, diabetes mellitus, hyperlipidemia, hypertension, BPH who presented to the device department with complaint of generalized weakness, shortness of breath, nonproductive cough, fever.  On presentation,he was hypoxic at 87%.  He was recently treated for multifocal pneumonia with Augmentin and doxycycline as an outpatient but continued to have symptoms.    Chest x-ray showed patchy airspace opacity with bilateral pleural effusion, possible mild pulmonary edema.  Patient admitted for further management of multifocal pneumonia not responding to outpatient antibiotic therapy.  4/6 Admit 4/11 Intubated 4/12 TF initiated 4/14 TF changed to Nepro per Nephrology 4/15 Starting CRRT  Patient remains intubated on ventilator support MV: 14.8 L/min Temp (24hrs), Avg:97.9 F (36.6 C), Min:97.3 F (36.3 C), Max:98.3 F (36.8 C)  Patient's daughter at bedside at time of visit. She reports patient's UBW to be "close to 200" and that his weight has been stable.  Of note, patient weighed  on admission at 184# but over the past few days has been weighed between 151-162#. Unable to determine true weight at this time, will continue to monitor trends closely. Daughter also reports patient was eating very well up until admission with 3 meals a day and good appetite.   Patient switched to TF formula Nepro yesterday due to worsening renal function. Now, plan is to start CRRT. Patient also started on Levo this AM.  Propofol discontinued this AM as triglycerides came back at 668.  Due to plan to start CRRT which should help decrease K+/Phos levels and now on Levo with increasing requirements (and Nepro very high in fiber), reached out to Nephrology and CCM about switching TF formula back on Vital. Everyone on board.   Due to CRRT being started and new stage to PI on buttocks, will also increase protein in tube feeds.     Admit weight: 184# Current weight: 162# I&O's: +2.2L since admit *Question accuracy of weights with such drastic difference. Daughter reports no weight changes but weight much lower than reported UBW. Will monitor closely.   Medications reviewed and include: 2000 units vitamin D, Colace, Miralax, FWF Q4H, MVI, 100mg  thiamine Precedex Fentanyl Levophed @ 8 mcg/min   Labs reviewed:  Creatinine 7.17 Phosphorus 8.2 Magnesium 3.3 Triglycerides 668 (as of today)   Diet Order:   Diet Order             Diet NPO time specified  Diet effective now                   EDUCATION NEEDS:  Not appropriate for education at  this time  Skin:  Skin Assessment: Skin Integrity Issues: Skin Integrity Issues:: Stage II Stage II: Left Buttocks  Last BM:  4/15 - rectal tube  Height:  Ht Readings from Last 1 Encounters:  05/07/23 5\' 6"  (1.676 m)   Weight:  Wt Readings from Last 1 Encounters:  04/28/2023 73.7 kg   Ideal Body Weight:  64.55 kg  BMI:  Body mass index is 26.22 kg/m.  Estimated Nutritional Needs:  Kcal:  1750-2100 kcals Protein:  125-140  grams Fluid:  >/= 1.8L    Scheryl Cushing RD, LDN Contact via Secure Chat.

## 2023-05-26 NOTE — Progress Notes (Signed)
 Patient's blood pressure dropped to 71/41 MAP 49 at 1900. Decreased fluid removal on CRRT. BP still low. CRRT fluid removal decreased to 0. CRRT stopped at 1900. BP continued to drop, sedation decreased. No improvement. Sedation cut off & Levo increased see MAR. Elink called. Per Dr. Rito Chess, started 25% albumin 250cc & 250cc bolus of NS. Levo ordered adjust. Levo started on central line. Per MD, 1 amp of bicarb given and levo increased to 20. No improvement. Levo increased to 30 per MD. No improvement. Levo increased to 40 per MD. Patrick Villa verbal to draw venous gas. Istat unable to result. Dr. Rito Chess ordered Vaso to be started. BP 170/69. Levo titrated down to 30 per MD. BP 159/72. Levo cut down to 20 per MD. BP 147/63. Levo cut down to 10 per MD. EKG done. Patient stable at this time

## 2023-05-26 NOTE — Procedures (Signed)
Central Venous Catheter Insertion Procedure Note  Patrick Villa  [id number]  Jul 23, 1933  Date:05/05/2023  Time:3:08 PM   Provider Performing:Demonica Farrey Zachery Hermes  Patrick Villa   Procedure: Insertion of Non-tunneled Central Venous Catheter(36556)with US  guidance (40981)    Indication(s) Hemodialysis  Consent Risks of the procedure as well as the alternatives and risks of each were explained to the patient and/or caregiver.  Consent for the procedure was obtained and is signed in the bedside chart  Anesthesia Topical only with 1% lidocaine   Timeout Verified patient identification, verified procedure, site/side was marked, verified correct patient position, special equipment/implants available, medications/allergies/relevant history reviewed, required imaging and test results available.  Sterile Technique Maximal sterile technique including full sterile barrier drape, hand hygiene, sterile gown, sterile gloves, mask, hair covering, sterile ultrasound probe cover (if used).  Procedure Description Area of catheter insertion was cleaned with chlorhexidine and draped in sterile fashion.   With real-time ultrasound guidance a HD catheter was placed into the right femoral vein.  Nonpulsatile blood flow and easy flushing noted in all ports.  The catheter was sutured in place and sterile dressing applied. Attempt made on left femoral, able to access fem vein but unable to thread catheter past about 10cm. Reattempt on right with success.    Complications/Tolerance None; patient tolerated the procedure well. Chest X-ray is ordered to verify placement for internal jugular or subclavian cannulation.  Chest x-ray is not ordered for femoral cannulation.  EBL Minimal  Specimen(s) None   [name], PA-C [facility] 05/13/2023 3:08 PM  Please see [email or web address] for pager details.  From 7A-7P if no response, please call [phone] After hours, please call ELink [phone]

## 2023-05-26 NOTE — Progress Notes (Signed)
OT Cancellation Note  Patient Details Name: Patrick Villa MRN: [id number] DOB: 1933/06/19   Cancelled Treatment:    Reason Eval/Treat Not Completed: Medical issues which prohibited therapy Patient currently vented and sedated. Will follow up once medically stable for therapy and schedule allows.   Genesis Paget OT/L Acute Rehabilitation Department  [phone]    05/19/2023, 8:02 AM

## 2023-05-26 NOTE — Progress Notes (Signed)
[facility] Progress Note  Subjective:  B/Cr cont to climb UOP 690 cc yesterday BP's dropped some more into 90s  Vitals:   05/17/2023 1145 05/20/2023 1200 05/24/2023 1210 04/29/2023 1215  BP: (!) 120/49 (!) 110/59 (!) 127/53 (!) 114/51  Pulse: (!) 105 (!) 107 (!) 107 (!) 108  Resp: (!) 25 (!) 24 (!) 26 (!) 26  Temp:      TempSrc:      SpO2: 97% 97% 97% 97%  Weight:      Height:        Exam: Gen on vent, sedated Sclera anicteric, throat w/ ETT No jvd or bruits Chest clear anterior/ lateral RRR no MRG Abd soft ntnd no mass or ascites +bs Ext LE or UE edema Neuro is on vent, sedated    Renal-related home meds: Hygroton 25 mg daily Hydralazine 50 mg 3 times daily Terazosin 5 mg at bedtime   4/14  UA: Moderate HBG, protein 100, RBC> 50, WBC 0-5 Urine sodium 28, urine creatinine 117 Renal ultrasound: 11.0/9.4 cm kidneys , no hydro     Assessment/ Plan: AKI: b/l creatinine 1.2- 1.5 from 2022- April 2025. Creat here 1.5 on admission, improved to 1.3 then high 1's and then up to 2.2 on 4/12, 3.2 yest and 4.9 today. UA on admit showed hematuria, mild proteinuria. US showed no obstruction. UNa borderline low. I/O even and wt's down. Had a hypotensive episode 4/12. Suspect AKI due to hypotension +/- hypovolemia. Gave IVF's overnight w/ no improvement and today azotemia is worse w/ BUN 150s. Plan is for CRRT. Have d/w CCM.  Hypotension: on pressors now, levo at 8 micrograms/min.  Hyperkalemia: changed to nepro TF's for now.  Acute hypoxic resp failure: intubated on 4/11. Per CCM.  Multifocal PNA: sp course of IV abx VDRF: per CCM HFrEF: per CCM     Rob Lexa Coronado MD  [facility] 05/15/2023, 1:29 PM  Recent Labs  Lab 05/09/23 0435 05/09/23 1632 05/09/23 1804 05/05/2023 0630  HGB 10.2*  --   --  8.5*  CALCIUM 8.8*  --  8.0* 7.9*  PHOS 8.8* 8.2*  --   --   CREATININE 4.90*  --  5.87* 7.17*  K 5.5*  --  5.0 4.6   No results for input(s): "IRON", "TIBC", "FERRITIN" in the  last 168 hours. Inpatient medications:  aspirin  81 mg Per Tube Daily   Chlorhexidine Gluconate Cloth  6 each Topical Daily   cholecalciferol  2,000 Units Per Tube Daily   docusate  100 mg Per Tube BID   doxycycline  100 mg Per Tube Q12H   famotidine  10 mg Per Tube Daily   finasteride  5 mg Oral Daily   free water  200 mL Per Tube Q4H   heparin injection (subcutaneous)  5,000 Units Subcutaneous Q8H   insulin aspart  0-15 Units Subcutaneous Q4H   insulin aspart  4 Units Subcutaneous Q4H   insulin glargine-yfgn  6 Units Subcutaneous BID   ipratropium-albuterol  3 mL Nebulization TID   methylPREDNISolone (SOLU-MEDROL) injection  40 mg Intravenous Daily   mouth rinse  15 mL Mouth Rinse Q2H   polyethylene glycol  17 g Per Tube Daily   thiamine  100 mg Per Tube Daily    sodium chloride 10 mL/hr at 05/16/2023 1119   dexmedetomidine (PRECEDEX) IV infusion 0.3 mcg/kg/hr (05/19/2023 1119)   feeding supplement (NEPRO CARB STEADY) 50 mL/hr at 05/12/2023 1119   fentaNYL infusion INTRAVENOUS 50 mcg/hr (05/18/2023 1119)  heparin 10,000 units/ 20 mL infusion syringe     norepinephrine (LEVOPHED) Adult infusion 8 mcg/min (05/04/2023 1119)   prismasol BGK 4/2.5     prismasol BGK 4/2.5     prismasol BGK 4/2.5     acetaminophen, albuterol, fentaNYL, guaiFENesin-dextromethorphan, heparin, labetalol, mouth rinse

## 2023-05-26 NOTE — Death Summary Note (Signed)
DEATH SUMMARY   Patient Details  Name: Patrick Villa MRN: [id number] DOB: 19-Dec-1933  Admission/Discharge Information   Admit Date:  05-07-23  Date of Death: Date of Death: 17-May-2023  Time of Death: Time of Death: 30-May-2125  Length of Stay: 10  Referring Physician: Elester Grim, MD   Reason(s) for Hospitalization  Patient was admitted with respiratory failure, shortness of breath, fever, generalized weakness.  Had been seen a few days earlier and started on antibiotics for pneumonia.  Admitted for multifocal pneumonia  Diagnoses  Preliminary cause of death:  Multiorgan dysfunction secondary to pneumonia Acute kidney injury Secondary Diagnoses (including complications and co-morbidities):  Principal Problem:   Multifocal pneumonia Active Problems:   Essential hypertension   COPD (chronic obstructive pulmonary disease) (HCC)   Hyperlipidemia   BPH (benign prostatic hyperplasia)   Hypokalemia   AKI (acute kidney injury) (HCC)   Hypoxia   Acute systolic heart failure (HCC)   Aortic atherosclerosis (HCC)   Coronary artery calcification   Malnutrition of moderate degree Encephalopathy HFrEF  Brief Hospital Course (including significant findings, care, treatment, and services provided and events leading to death)  Patrick Villa is a 89 y.o. year old male who presented to the hospital with weakness, he had been seen in the emergency department a few days earlier treated for possible pneumonia discharged home but came back to the hospital worsening symptoms Noted to be hypoxemic X-ray with bilateral infiltrate, fevers Started on antibiotics for possible community-acquired pneumonia Symptoms did worsen and patient did have a CT scan done showing multifocal infiltrate interstitial changes with concerns for Boop, antibiotics escalated.  Concern for decompensated heart failure, was started on diuretics. Continued on broad-spectrum antibiotics. Transferred to the ICU on 4/11 secondary  to increasing oxygen requirements.  Was intubated-had bronchoscopy performed-bronchoscopic sampling's did not reveal any specific pathology, no organisms isolated. Autoimmune panel negative, elevated markers of inflammation including CRP and ESR. With worsening renal function-nephrology was consulted on 4/14, despite fluid resuscitation, renal function continued to worsen Goals of care discussion with family Increasing ventilator requirement, worsening kidney injury, requirement for pressors Decision made to start CRRT, had hemodialysis catheter placed Started on CRRT  Increasing pressor requirement, there was rapid escalation in vasopressor requirement, CRRT was held.  Hemodynamic resuscitation was been performed  Patient continued to worsen and sustained a cardiac arrest with pulseless electrical activity. multiple rounds of resuscitative meds attempted without achieving ROSC  With family involvement decision was made to cease resuscitative efforts and patient succumbed to his illness at 05-30-2125 on 17-May-2023  Pertinent Labs and Studies  Significant Diagnostic Studies US  RENAL Result Date: 05/09/2023 CLINICAL DATA:  Acute renal injury EXAM: RENAL / URINARY TRACT ULTRASOUND COMPLETE COMPARISON:  None Available. FINDINGS: Right Kidney: Renal measurements: 11.0 x 5.3 x 5.8 cm. = volume: 178 mL. Echogenicity within normal limits. No mass or hydronephrosis visualized. Left Kidney: Renal measurements: 9.4 x 5.2 x 5.2 cm. = volume: 132 mL. Echogenicity within normal limits. No mass or hydronephrosis visualized. Bladder: Decompressed by Foley catheter. Other: None. IMPRESSION: No acute abnormality noted. Electronically Signed   By: Violeta Grey M.D.   On: 05/09/2023 20:59   DG CHEST PORT 1 VIEW Result Date: 05/08/2023 CLINICAL DATA:  Endotracheal tube position. EXAM: PORTABLE CHEST 1 VIEW COMPARISON:  05/06/2023 FINDINGS: Endotracheal tube tip is 4.3 cm above the base of the carina. Subtle hazy opacity  again noted in the lung bases. Cardiopericardial silhouette is at upper limits of normal for size. NG tube  is positioned in the stomach. Telemetry leads overlie the chest. IMPRESSION: 1. Endotracheal tube tip is 4.3 cm above the base of the carina. 2. Subtle hazy opacity in the lung bases, similar to prior. Electronically Signed   By: Donnal Fusi M.D.   On: 05/08/2023 10:58   DG Abd 1 View Result Date: 05/07/2023 CLINICAL DATA:  NG tube EXAM: ABDOMEN - 1 VIEW COMPARISON:  None Available. FINDINGS: Nasogastric tube tip is in the proximal body/fundus of the stomach. No dilated bowel loops seen in the upper abdomen. IMPRESSION: Nasogastric tube tip is in the proximal body/fundus of the stomach. Electronically Signed   By: Tyron Gallon M.D.   On: 05/07/2023 17:08   DG Chest 1 View Result Date: 05/06/2023 CLINICAL DATA:  Endotracheal tube EXAM: CHEST  1 VIEW COMPARISON:  Chest x-ray 05/06/2023 FINDINGS: Endotracheal tube tip is 3.3 cm above the carina. A aorta is ectatic. Cardiac silhouette is mildly enlarged, unchanged. Hazy opacities in the lower lungs persist. Costophrenic angles are clear. No pneumothorax or acute fracture. IMPRESSION: 1. Endotracheal tube tip is 3.3 cm above the carina. 2. Hazy opacities in the lower lungs persist. Electronically Signed   By: Tyron Gallon M.D.   On: 05/06/2023 19:53   DG CHEST PORT 1 VIEW Result Date: 05/06/2023 CLINICAL DATA:  Hypoxia EXAM: PORTABLE CHEST 1 VIEW COMPARISON:  Chest x-ray 05/05/2023.  Chest CT 05/03/2023. FINDINGS: The heart is mildly enlarged, unchanged. Hazy and patchy bilateral opacities have decreased. Patchy opacities persist in the right mid and lower lung and left lower lung. There is no pleural effusion or pneumothorax. No acute fractures are seen. IMPRESSION: Hazy and patchy bilateral opacities have decreased in the upper lungs. Electronically Signed   By: Tyron Gallon M.D.   On: 05/06/2023 18:42   DG CHEST PORT 1 VIEW Result Date:  05/05/2023 CLINICAL DATA:  Hypoxia. EXAM: PORTABLE CHEST 1 VIEW COMPARISON:  May 03, 2023. FINDINGS: Stable cardiomediastinal silhouette. Continued right lung opacities are noted concerning for pneumonia. Left lung is unremarkable. Bony thorax is unremarkable. IMPRESSION: Continued right lung opacities are noted concerning for pneumonia. Electronically Signed   By: Rosalene Colon M.D.   On: 05/05/2023 10:53   CT CHEST WO CONTRAST Result Date: 05/04/2023 CLINICAL DATA:  Respiratory illness shortness of breath. EXAM: CT CHEST WITHOUT CONTRAST TECHNIQUE: Multidetector CT imaging of the chest was performed following the standard protocol without IV contrast. RADIATION DOSE REDUCTION: This exam was performed according to the departmental dose-optimization program which includes automated exposure control, adjustment of the mA and/or kV according to patient size and/or use of iterative reconstruction technique. COMPARISON:  04/26/2023. FINDINGS: Cardiovascular: Atherosclerotic calcification of the aorta, aortic valve and coronary arteries. Enlarged pulmonic trunk and heart. No pericardial effusion. Mediastinum/Nodes: Mediastinal lymph nodes measure up to 1.5 cm in the low right paratracheal station, as before. Hilar regions are difficult to definitively evaluate without IV contrast. No axillary adenopathy. Air in the esophagus can be seen with dysmotility. Lungs/Pleura: Increasing patchy ground-glass in the lungs bilaterally with some consolidation in the dependent right lower lobe. Trace bilateral pleural effusions. Airway is unremarkable. Upper Abdomen: Visualized portions of the liver, gallbladder, adrenal glands, kidneys, spleen, pancreas, stomach and bowel are grossly unremarkable. No upper abdominal adenopathy. Musculoskeletal: Degenerative changes in the spine. IMPRESSION: 1. Worsening multi lobar pneumonia. 2. Mediastinal adenopathy, likely reactive in etiology. 3. Trace bilateral pleural effusions. 4. Aortic  atherosclerosis (ICD10-I70.0). Coronary artery calcification. 5. Enlarged pulmonic trunk, indicative of pulmonary arterial hypertension. Electronically  Signed   By: Shearon Denis M.D.   On: 05/04/2023 15:55   DG CHEST PORT 1 VIEW Result Date: 05/03/2023 CLINICAL DATA:  Hypoxia. EXAM: PORTABLE CHEST 1 VIEW COMPARISON:  May 02, 2023. FINDINGS: Stable cardiomediastinal silhouette. Right lung opacities are noted concerning for pneumonia. Probable small right pleural effusion is noted. Left lung is clear. Bony thorax is unremarkable. IMPRESSION: Right lung opacities are noted concerning for pneumonia with probable small right pleural effusion. Electronically Signed   By: Rosalene Colon M.D.   On: 05/03/2023 10:13   DG CHEST PORT 1 VIEW Result Date: 05/02/2023 CLINICAL DATA:  Shortness of breath. EXAM: PORTABLE CHEST 1 VIEW COMPARISON:  April 30, 2023 FINDINGS: The heart size and mediastinal contours are within normal limits. Mild bilateral ill-defined infiltrates are seen. Mild areas of atelectasis and/or infiltrate is also noted within the bilateral lung bases, right greater than left. There are small bilateral pleural effusions. No pneumothorax is identified. The visualized skeletal structures are unremarkable. IMPRESSION: 1. Mild bilateral ill-defined infiltrates with mild bibasilar atelectasis and/or infiltrate. 2. Small bilateral pleural effusions. Electronically Signed   By: Virgle Grime M.D.   On: 05/02/2023 19:33   ECHOCARDIOGRAM COMPLETE Result Date: 05/01/2023    ECHOCARDIOGRAM REPORT   Patient Name:   Patrick Villa Date of Exam: 05/01/2023 Medical Rec #:  [id number]     Height:       66.0 in Accession #:    [id number]    Weight:       184.1 lb Date of Birth:  1933-11-30     BSA:          1.931 m Patient Age:    89 years      BP:           174/80 mmHg Patient Gender: M             HR:           90 bpm. Exam Location:  Inpatient Procedure: 2D Echo, Color Doppler, Cardiac Doppler and Intracardiac             Opacification Agent (Both Spectral and Color Flow Doppler were            utilized during procedure). Indications:    CHF-Diastolic  History:        Patient has prior history of Echocardiogram examinations, most                 recent 01/28/2018. COPD; Risk Factors:Diabetes, Dyslipidemia and                 Former Smoker.  Sonographer:    Travis Friedman RDCS Referring Phys: [phone] AMRIT ADHIKARI IMPRESSIONS  1. Left ventricular ejection fraction, by estimation, is 40 to 45%. The left ventricle has mildly decreased function. The left ventricle demonstrates global hypokinesis. There is mild left ventricular hypertrophy. Left ventricular diastolic parameters are consistent with Grade I diastolic dysfunction (impaired relaxation).  2. Right ventricular systolic function is normal. The right ventricular size is normal.  3. Left atrial size was moderately dilated.  4. The mitral valve is normal in structure. No evidence of mitral valve regurgitation. No evidence of mitral stenosis.  5. The aortic valve is calcified. There is moderate calcification of the aortic valve. There is mild thickening of the aortic valve. Aortic valve regurgitation is trivial. Aortic valve sclerosis is present, with no evidence of aortic valve stenosis. Aortic valve mean gradient measures 7.0 mmHg. Aortic valve Vmax measures 1.78  m/s.  6. The inferior vena cava is normal in size with greater than 50% respiratory variability, suggesting right atrial pressure of 3 mmHg. FINDINGS  Left Ventricle: Left ventricular ejection fraction, by estimation, is 40 to 45%. The left ventricle has mildly decreased function. The left ventricle demonstrates global hypokinesis. Definity  contrast agent was given IV to delineate the left ventricular  endocardial borders. The left ventricular internal cavity size was normal in size. There is mild left ventricular hypertrophy. Left ventricular diastolic parameters are consistent with Grade I diastolic dysfunction (impaired  relaxation). Right Ventricle: The right ventricular size is normal. No increase in right ventricular wall thickness. Right ventricular systolic function is normal. Left Atrium: Left atrial size was moderately dilated. Right Atrium: Right atrial size was normal in size. Pericardium: There is no evidence of pericardial effusion. Mitral Valve: The mitral valve is normal in structure. No evidence of mitral valve regurgitation. No evidence of mitral valve stenosis. Tricuspid Valve: The tricuspid valve is normal in structure. Tricuspid valve regurgitation is not demonstrated. No evidence of tricuspid stenosis. Aortic Valve: The aortic valve is calcified. There is moderate calcification of the aortic valve. There is mild thickening of the aortic valve. Aortic valve regurgitation is trivial. Aortic valve sclerosis is present, with no evidence of aortic valve stenosis. Aortic valve mean gradient measures 7.0 mmHg. Aortic valve peak gradient measures 12.7 mmHg. Aortic valve area, by VTI measures 1.78 cm. Pulmonic Valve: The pulmonic valve was normal in structure. Pulmonic valve regurgitation is not visualized. No evidence of pulmonic stenosis. Aorta: The aortic root is normal in size and structure. Venous: The inferior vena cava is normal in size with greater than 50% respiratory variability, suggesting right atrial pressure of 3 mmHg. IAS/Shunts: No atrial level shunt detected by color flow Doppler.  LEFT VENTRICLE PLAX 2D LVIDd:         5.23 cm      Diastology LVIDs:         3.60 cm      LV e' medial:   9.25 cm/s LV PW:         1.07 cm      LV E/e' medial: 8.8 LV IVS:        1.23 cm LVOT diam:     2.10 cm LV SV:         56 LV SV Index:   29 LVOT Area:     3.46 cm  LV Volumes (MOD) LV vol d, MOD A2C: 165.0 ml LV vol d, MOD A4C: 103.0 ml LV vol s, MOD A2C: 93.7 ml LV vol s, MOD A4C: 51.5 ml LV SV MOD A2C:     71.3 ml LV SV MOD A4C:     103.0 ml LV SV MOD BP:      65.6 ml RIGHT VENTRICLE RV Basal diam:  3.10 cm RV Mid diam:     1.80 cm RV S prime:     17.50 cm/s TAPSE (M-mode): 2.7 cm LEFT ATRIUM              Index        RIGHT ATRIUM           Index LA diam:        3.20 cm  1.66 cm/m   RA Area:     17.60 cm LA Vol (A2C):   134.0 ml 69.41 ml/m  RA Volume:   47.50 ml  24.60 ml/m LA Vol (A4C):   64.5 ml  33.41 ml/m LA Biplane  Vol: 94.4 ml  48.90 ml/m  AORTIC VALVE AV Area (Vmax):    1.82 cm AV Area (Vmean):   1.74 cm AV Area (VTI):     1.78 cm AV Vmax:           178.00 cm/s AV Vmean:          124.000 cm/s AV VTI:            0.313 m AV Peak Grad:      12.7 mmHg AV Mean Grad:      7.0 mmHg LVOT Vmax:         93.60 cm/s LVOT Vmean:        62.400 cm/s LVOT VTI:          0.161 m LVOT/AV VTI ratio: 0.51  AORTA Ao Root diam: 3.30 cm Ao Asc diam:  3.20 cm MITRAL VALVE MV Area (PHT): 6.02 cm     SHUNTS MV Decel Time: 126 msec     Systemic VTI:  0.16 m MV E velocity: 81.50 cm/s   Systemic Diam: 2.10 cm MV A velocity: 139.00 cm/s MV E/A ratio:  0.59 Dorothye Gathers MD Electronically signed by Dorothye Gathers MD Signature Date/Time: 05/01/2023/2:24:07 PM    Final    DG Chest Port 1 View Result Date: 04/30/2023 : Questionable sepsis - evaluate for abnormality EXAM: PORTABLE CHEST 1 VIEW COMPARISON:  Chest x-ray 04/26/2023, CT chest 04/26/2023 FINDINGS: The heart and mediastinal contours are unchanged. Persistent patchy airspace opacities. Slightly increased perihilar interstitial markings. At least trace bilateral pleural effusions pleural effusion. No pneumothorax. No acute osseous abnormality. IMPRESSION: 1. Persistent patchy airspace opacities with at least trace bilateral pleural effusions. 2. Possible developing mild pulmonary edema. 3. Followup PA and lateral chest X-ray is recommended in 3-4 weeks following therapy to ensure resolution. Electronically Signed   By: Morgane  Naveau M.D.   On: 04/30/2023 14:02   CT Angio Chest PE W and/or Wo Contrast Result Date: 04/26/2023 CLINICAL DATA:  Pulmonary embolism (PE) suspected, low to intermediate  prob, positive D-dimer. Shortness of breath. EXAM: CT ANGIOGRAPHY CHEST WITH CONTRAST TECHNIQUE: Multidetector CT imaging of the chest was performed using the standard protocol during bolus administration of intravenous contrast. Multiplanar CT image reconstructions and MIPs were obtained to evaluate the vascular anatomy. RADIATION DOSE REDUCTION: This exam was performed according to the departmental dose-optimization program which includes automated exposure control, adjustment of the mA and/or kV according to patient size and/or use of iterative reconstruction technique. CONTRAST:  75mL OMNIPAQUE  IOHEXOL  350 MG/ML SOLN COMPARISON:  None Available. FINDINGS: Cardiovascular: No evidence of embolism to the proximal subsegmental pulmonary artery level. Mild cardiomegaly. No pericardial effusion. No aortic aneurysm. There are coronary artery calcifications, in keeping with coronary artery disease. There are also mild peripheral atherosclerotic vascular calcifications of thoracic aorta and its major branches. Mediastinum/Nodes: Visualized thyroid  gland appears grossly unremarkable. No solid / cystic mediastinal masses. The esophagus is nondistended precluding optimal assessment. No axillary, mediastinal or hilar lymphadenopathy by size criteria. Lungs/Pleura: The central tracheo-bronchial tree is patent. There are patchy ground-glass opacities throughout bilateral lungs with associated areas of smooth inter lobular septal thickening (crazy paving pattern). Findings are nonspecific and differential diagnosis includes multilobar pneumonia, pulmonary edema, ARDS, pulmonary alveolar proteinosis, etc. Correlate clinically. No pleural effusion or pneumothorax. No mass or consolidation. Upper Abdomen: Visualized upper abdominal viscera within normal limits. Musculoskeletal: The visualized soft tissues of the chest wall are grossly unremarkable. No suspicious osseous lesions. There are mild multilevel degenerative changes in  the visualized spine. Review of the MIP images confirms the above findings. IMPRESSION: 1. No embolism to the proximal subsegmental pulmonary artery level. 2. There are patchy ground-glass opacities throughout bilateral lungs with associated areas of smooth inter lobular septal thickening. Findings are nonspecific and differential diagnosis includes multilobar pneumonia, pulmonary edema, ARDS, pulmonary alveolar proteinosis, etc. 3. Multiple other nonacute observations, as described above. Aortic Atherosclerosis (ICD10-I70.0). Electronically Signed   By: Beula Brunswick M.D.   On: 04/26/2023 16:52   DG Chest 2 View Result Date: 04/26/2023 CLINICAL DATA:  One-week history of shortness of breath EXAM: CHEST - 2 VIEW COMPARISON:  Chest radiograph dated 02/02/2019 FINDINGS: Normal lung volumes. Multifocal bilateral nodular and confluent opacities. No pleural effusion or pneumothorax. Enlarged cardiomediastinal silhouette. No acute osseous abnormality. IMPRESSION: 1. Multifocal bilateral nodular and confluent opacities, suspicious for multifocal pneumonia. 2. Cardiomegaly. Electronically Signed   By: Limin  Xu M.D.   On: 04/26/2023 15:28    Microbiology Recent Results (from the past 240 hours)  Culture, Respiratory w Gram Stain     Status: None   Collection Time: 05/06/23  6:27 PM   Specimen: Bronchoalveolar Lavage; Respiratory  Result Value Ref Range Status   Specimen Description   Final    BRONCHIAL ALVEOLAR LAVAGE Performed at [facility], [address] [place], [place] [address]    Special Requests   Final    NONE Performed at [facility], [address] [place], [place] [address]    Gram Stain NO WBC SEEN NO ORGANISMS SEEN   Final   Culture   Final    NO GROWTH 2 DAYS Performed at [facility], [address], [place], [place] [address]    Report Status 05/09/2023 FINAL  Final    Lab Basic Metabolic Panel: Recent Labs  Lab 05/08/23 0516  05/08/23 1255 05/09/23 0435 05/09/23 1632 05/09/23 1804 05/01/2023 0630 05/24/2023 1550 05/06/2023 2035  NA  --  144 146*  --  142 143 143  --   K  --  5.0 5.5*  --  5.0 4.6 6.1*  --   CL  --  109 110  --  107 108 108  --   CO2  --  24 25  --  22 19* 16*  --   GLUCOSE  --  185* 254*  --  272* 194* 218*  --   BUN  --  101* 128*  --  157* 159* 187*  --   CREATININE  --  3.25* 4.90*  --  5.87* 7.17* 8.15*  --   CALCIUM   --  9.0 8.8*  --  8.0* 7.9* 7.9*  --   MG 3.1* 3.2* 3.6* 3.3*  --   --   --  3.4*  PHOS 6.7* 7.3* 8.8* 8.2*  --   --  11.6* 16.2*   Liver Function Tests: Recent Labs  Lab 05/16/2023 1550  ALBUMIN  1.6*   No results for input(s): "LIPASE", "AMYLASE" in the last 168 hours. No results for input(s): "AMMONIA" in the last 168 hours. CBC: Recent Labs  Lab 05/09/23 0435 05/18/2023 0630 05/09/2023 2120  WBC 15.7* 16.8* 20.3*  HGB 10.2* 8.5* 8.3*  HCT 36.3* 27.3* 26.4*  MCV 95.3 90.1 91.7  PLT 248 179 207   Cardiac Enzymes: No results for input(s): "CKTOTAL", "CKMB", "CKMBINDEX", "TROPONINI" in the last 168 hours. Sepsis Labs: Recent Labs  Lab 05/09/23 0435 05/01/2023 0630 05/13/2023 2120  WBC 15.7* 16.8* 20.3*  Procedures/Operations  Endotracheal intubation  4/11 Bronchoscopy  4/11 HD catheter placement 4/15   Isaul Landi A Suhana Wilner 05/14/2023, 5:31 PM

## 2023-05-26 NOTE — Procedures (Signed)
 Arterial Line Insertion Start/End4/15/2025 8:26 PM  Patient location: ICU. Preanesthetic checklist: patient identified, site marked, risks and benefits discussed, surgical consent, monitors and equipment checked, pre-op evaluation and timeout performed Left, radial was placed Catheter size: 20 G Hand hygiene performed  and maximum sterile barriers used  Allen's test indicative of satisfactory collateral circulation Attempts: 1 Procedure performed without using ultrasound guided technique. Following insertion, dressing applied and Biopatch. Post procedure assessment: normal  Patient tolerated the procedure well with no immediate complications.

## 2023-05-26 NOTE — Progress Notes (Signed)
 Afternoon round: HD cath placed this afternoon for trial of CRRT given worsening renal function, oliguria. He is still on higher vent requirements which is precluding further SAT/SBT at this time. Had long conversations with daughter, Orelia Binet, today as did Dr. Gaynell Keeler about current status, guarded prognosis. She has opted at this time for trial of dialysis as above with understanding that we have many further hurdles to pass.

## 2023-05-26 NOTE — Progress Notes (Addendum)
Patient Name: Patrick Villa Date of Encounter: 05/17/2023 [facility] Cardiologist: Jodelle Red, MD   Interval Summary  .    Overnight patient with hypotension prompting sedation changes as well as fluid bolus per primary team night coverage. Nephrology is now also following this patient, recommended holding anti-hypertensives. Overnight creatinine rose 5.87->7.17. Minimal urine in collection device is brown/bloody appearing today. Overall clinical picture today is very poor with labs and vitals showing evidence of organ failure, patient now on Norepinephrine.  Vital Signs .    Vitals:   05/07/2023 0603 05/02/2023 0615 04/28/2023 0630 05/14/2023 0746  BP:  (!) 89/38 (!) 115/43 (!) 126/50  Pulse: 92 93 94 (!) 102  Resp: (!) 22 (!) 22 (!) 23 (!) 21  Temp:      TempSrc:      SpO2: 96% 97% 97% 98%  Weight:      Height:        Intake/Output Summary (Last 24 hours) at 05/19/2023 0755 Last data filed at 05/06/2023 0745 Gross per 24 hour  Intake 3305.82 ml  Output 490 ml  Net 2815.82 ml      05/17/2023    4:28 AM 05/09/2023    5:00 AM 05/08/2023    5:00 AM  Last 3 Weights  Weight (lbs) 162 lb 7.7 oz 152 lb 1.9 oz 151 lb 7.3 oz  Weight (kg) 73.7 kg 69 kg 68.7 kg      Telemetry/ECG    Sinus rhythm, slowly increasing HR today - Personally Reviewed  Physical Exam .   GEN: intubated/sedated Neck: No JVD Cardiac: RRR, no murmurs, rubs, or gallops.  Respiratory: Clear to auscultation bilaterally (anterior exam only due to intubated status). GI: Soft, nontender, non-distended  MS: No edema  Assessment & Plan .    89 year old man with HTN, HLP, aortic atherosclerosis and coronary calcification without angina or clinically evident CAD, newly diagnosed LV systolic dysfunction (EF 40-45%, global hypokinesis), second-degree Mobitz type I AV block, admitted with fever and lethargy and multifocal pneumonia, subsequently developed acute respiratory failure requiring  endotracheal intubation 05/06/2023, worsening acute renal insufficiency.  Acute hypoxic respiratory failure Admitted with fever and lethargy and multifocal pneumonia, subsequently developed acute respiratory failure requiring endotracheal intubation 05/06/2023. No fever today. Increased ventilator support today, FiO2 70%.  Continue abx and ventilator support per pulmonary critical care  Acute systolic congestive heart failure Coronary/aortic atherosclerosis In the setting of hypoxic respiratory failure, sepsis, patient found with LVEF 40-45% and global hypokinesis. Current critical illness precludes further ischemic evaluation at this time and worsening AKI/hx AVB limit GDMT. Appears euvolemic. Hold Hydralazine, Isosorbide with AM hypotension Given coronary atherosclerosis, would consider ischemic evaluation to further assess HFrEF upon recovery from critical illness. Recommend LDL goal 70mg /dL.     Hx second degree type I AVB Patient previously with AVB during an admission years ago. No recurrence this admission. Continue to avoid AV nodal agents   Hypertension Patient with worsening hypotension today, now on vasopressor support. Discontinue hydralazine and isosorbide.   AKI Labs today continue to show significant decline in renal function with creatinine, 3.95->4.90->5.87->7.17. This is despite relatively stable BP with MAP >65 until early this morning and positive fluid balance. Less than urine output in the last 24 hours.   For questions or updates, please contact [facility] Please consult [email or web address] for contact info under        Signed, Perlie Gold, PA-C   I have seen and examined the patient along with Clayburn Pert  Broadus Canes, PA-C .  I have reviewed the chart, notes and new data.  I agree with PA/NP's note.  Key new complaints: Remains intubated/mechanically ventilated and sedated. Key examination changes: Oxygenation have deteriorated overnight, FiO2 up to  0.7.  He has gone from being hypertensive to requiring pressors.  Currently hemodynamically compensated on norepinephrine IV.  Unfortunately has become even more oliguric. Key new findings / data: Worsening renal parameters worsening metabolic acidosis, worsening anemia and elevated WBC  PLAN: Now with evidence of multisystem organ failure, requiring pressor support and approaching anuric status.  Prognosis is poor. Respiratory virus panel negative and BAL fluid culture negative after 2 days, blood cultures negative.  Immunological screening tests also negative.  CRITICAL CARE Performed by: Karyl Paget Gina Costilla   Total critical care time: 20 minutes  Critical care time was exclusive of separately billable procedures and treating other patients.  Critical care was necessary to treat or prevent imminent or life-threatening deterioration.  Critical care was time spent personally by me on the following activities: development of treatment plan with patient and/or surrogate as well as nursing, discussions with consultants, evaluation of patient's response to treatment, examination of patient, obtaining history from patient or surrogate, ordering and performing treatments and interventions, ordering and review of laboratory studies, ordering and review of radiographic studies, pulse oximetry and re-evaluation of patient's condition.   Luana Rumple, MD, [facility] [phone] 05/15/2023, 12:38 PM

## 2023-05-26 DEATH — deceased
